# Patient Record
Sex: Male | Born: 1955 | Race: White | Hispanic: No | Marital: Married | Smoking: Never smoker
Health system: Southern US, Community
[De-identification: ages and names within clinical notes are randomized; demographics above are authoritative.]

## PROBLEM LIST (undated history)

## (undated) DIAGNOSIS — I10 Essential (primary) hypertension: Secondary | ICD-10-CM

## (undated) DIAGNOSIS — G4730 Sleep apnea, unspecified: Secondary | ICD-10-CM

## (undated) DIAGNOSIS — K589 Irritable bowel syndrome without diarrhea: Secondary | ICD-10-CM

## (undated) DIAGNOSIS — M1990 Unspecified osteoarthritis, unspecified site: Secondary | ICD-10-CM

## (undated) DIAGNOSIS — K5792 Diverticulitis of intestine, part unspecified, without perforation or abscess without bleeding: Secondary | ICD-10-CM

## (undated) DIAGNOSIS — E119 Type 2 diabetes mellitus without complications: Secondary | ICD-10-CM

## (undated) DIAGNOSIS — M109 Gout, unspecified: Secondary | ICD-10-CM

## (undated) HISTORY — DX: Essential (primary) hypertension: I10

## (undated) HISTORY — PX: WRIST SURGERY: SHX841

## (undated) HISTORY — DX: Gout, unspecified: M10.9

## (undated) HISTORY — DX: Diverticulitis of intestine, part unspecified, without perforation or abscess without bleeding: K57.92

## (undated) HISTORY — DX: Irritable bowel syndrome, unspecified: K58.9

## (undated) HISTORY — PX: OTHER SURGICAL HISTORY: SHX169

---

## 2004-06-21 ENCOUNTER — Emergency Department (HOSPITAL_COMMUNITY): Admission: EM | Admit: 2004-06-21 | Discharge: 2004-06-21 | Payer: Self-pay | Admitting: Emergency Medicine

## 2004-06-23 ENCOUNTER — Emergency Department (HOSPITAL_COMMUNITY): Admission: EM | Admit: 2004-06-23 | Discharge: 2004-06-23 | Payer: Self-pay | Admitting: Emergency Medicine

## 2004-11-10 ENCOUNTER — Emergency Department (HOSPITAL_COMMUNITY): Admission: EM | Admit: 2004-11-10 | Discharge: 2004-11-10 | Payer: Self-pay | Admitting: Family Medicine

## 2004-11-12 ENCOUNTER — Ambulatory Visit (HOSPITAL_BASED_OUTPATIENT_CLINIC_OR_DEPARTMENT_OTHER): Admission: RE | Admit: 2004-11-12 | Discharge: 2004-11-12 | Payer: Self-pay | Admitting: Orthopedic Surgery

## 2004-11-12 ENCOUNTER — Ambulatory Visit (HOSPITAL_COMMUNITY): Admission: RE | Admit: 2004-11-12 | Discharge: 2004-11-12 | Payer: Self-pay | Admitting: Orthopedic Surgery

## 2007-01-16 ENCOUNTER — Ambulatory Visit (HOSPITAL_COMMUNITY): Admission: RE | Admit: 2007-01-16 | Discharge: 2007-01-16 | Payer: Self-pay | Admitting: Family Medicine

## 2007-01-24 ENCOUNTER — Encounter: Admission: RE | Admit: 2007-01-24 | Discharge: 2007-04-24 | Payer: Self-pay | Admitting: Family Medicine

## 2009-01-24 ENCOUNTER — Emergency Department (HOSPITAL_COMMUNITY): Admission: EM | Admit: 2009-01-24 | Discharge: 2009-01-24 | Payer: Self-pay | Admitting: Family Medicine

## 2009-04-08 ENCOUNTER — Emergency Department (HOSPITAL_COMMUNITY): Admission: EM | Admit: 2009-04-08 | Discharge: 2009-04-08 | Payer: Self-pay | Admitting: Family Medicine

## 2011-03-12 NOTE — Op Note (Signed)
NAME:  Craig Wagner, AVITABILE NO.:  [id number]   MEDICAL RECORD NO.:  [id number]          PATIENT TYPE:  AMB   LOCATION:  DSC                          FACILITY:  [facility]   PHYSICIAN:  Cindee Salt, M.D.       DATE OF BIRTH:  10/05/1956   DATE OF PROCEDURE:  11/12/2004  DATE OF DISCHARGE:                                 OPERATIVE REPORT   PREOPERATIVE DIAGNOSIS:  Dog bite with infection, right index finger.   POSTOPERATIVE DIAGNOSIS:  Dog bite with infection, right index finger.   OPERATION:  Incision and drainage right index finger   SURGEON:  Cindee Salt, M.D.   ANESTHESIA:  General.   HISTORY:  The patient is a 55 year old male who suffered. Dog bite to his  __________ index finger. He has obvious infection of the index with a nail  plate injury.  He is admitted for incision and drainage.   PROCEDURE:  The patient is brought to the operating room and general  anesthetic carried out without difficulty.  He was prepped using DuraPrep,  supine position, left arm free. The limb was elevated for exsanguination,  tourniquet placed high on the arm was inflated to 250 mmHg.  The volar  incision was opened. Purulent material immediately encountered. Cultures  taken for both aerobic and anaerobic cultures. This was widely opened.  A  separate incision was then made over the dorsal aspect of the finger. This  did not show any purulent material. The nail plate was removed. The  laceration of the nail bed was identified. These were each irrigated, out-  packed, and a sterile compressive dressing was applied. The patient  tolerated the procedure well was taken to the recovery room for observation  in satisfactory condition. He is discharged home to return to __________ of  [facility] on Monday on Vicodin. He is on Augmentin.       GK/MEDQ  D:  11/12/2004  T:  11/12/2004  Job:  [id number]

## 2013-06-05 ENCOUNTER — Encounter: Payer: Self-pay | Admitting: Emergency Medicine

## 2013-06-05 ENCOUNTER — Emergency Department
Admission: EM | Admit: 2013-06-05 | Discharge: 2013-06-05 | Disposition: A | Discharge status: Home / Self Care | Payer: Self-pay | Source: Home / Self Care | Attending: Emergency Medicine | Admitting: Emergency Medicine

## 2013-06-05 DIAGNOSIS — S61509A Unspecified open wound of unspecified wrist, initial encounter: Secondary | ICD-10-CM

## 2013-06-05 DIAGNOSIS — Z23 Encounter for immunization: Secondary | ICD-10-CM

## 2013-06-05 DIAGNOSIS — S61512A Laceration without foreign body of left wrist, initial encounter: Secondary | ICD-10-CM

## 2013-06-05 MED ORDER — AMOXICILLIN-POT CLAVULANATE 875-125 MG PO TABS: 1.0000 | ORAL_TABLET | Freq: Two times a day (BID) | ORAL | Status: DC

## 2013-06-05 NOTE — ED Provider Notes (Signed)
CSN: [id number]     Arrival date & time 06/05/13  1431 History     First MD Initiated Contact with Patient 06/05/13 1453     Chief Complaint  Patient presents with  . Extremity Laceration    Patient is a 57 y.o. male presenting with skin laceration. The history is provided by the patient.  Laceration Location:  Shoulder/arm Shoulder/arm laceration location:  L wrist Length (cm):  3 Depth:  Through dermis Quality: straight   Bleeding: controlled   Time since incident:  1 hour Laceration mechanism:  Knife (Accidental, while working doing repairs at home) Pain details:    Quality:  Sharp   Severity:  Mild   Timing:  Constant   Progression:  Unchanged Foreign body present:  No foreign bodies Relieved by:  Nothing Tetanus status: About 5 years ago.  Past medical history: Borderline elevated BP, followed by his PCP. Otherwise, past medical history negative. History reviewed. No pertinent past medical history. History reviewed. No pertinent past surgical history. No family history on file.  family history noncontributory. History  Substance Use Topics  . Smoking status: Not on file  . Smokeless tobacco: Not on file  . Alcohol Use: Not on file    Review of Systems  All other systems reviewed and are negative.    Allergies  Review of patient's allergies indicates no known allergies.  Home Medications   Current Outpatient Rx  Name  Route  Sig  Dispense  Refill  . amoxicillin-clavulanate (AUGMENTIN) 875-125 MG per tablet   Oral   Take 1 tablet by mouth every 12 (twelve) hours. Take for 5 days. Take with food. (This is an antibiotic)   10 tablet   0    BP 155/95  Pulse 80  Temp(Src) 98 F (36.7 C) (Oral)  Ht 6' (1.829 m)  Wt 195 lb (88.451 kg)  BMI 26.44 kg/m2  SpO2 96% Physical Exam  Nursing note and vitals reviewed. Constitutional: He is oriented to person, place, and time. He appears well-developed and well-nourished. No distress.  HENT:  Head:  Normocephalic and atraumatic.  Eyes: Conjunctivae and EOM are normal. Pupils are equal, round, and reactive to light. No scleral icterus.  Neck: Normal range of motion.  Cardiovascular: Normal rate.   Pulmonary/Chest: Effort normal.  Abdominal: He exhibits no distension.  Musculoskeletal: Normal range of motion.       Left forearm: He exhibits laceration. He exhibits no bony tenderness.       Arms: Radial artery intact. Distal neurovascular normal. Capillary refill distally of each digit normal. Range of motion and tendons tested, all within normal limits. No active bleeding. No bony tenderness. Wrist and hand functions normally.  Neurological: He is alert and oriented to person, place, and time.  Skin: Skin is warm.  Psychiatric: He has a normal mood and affect.    ED Course   LACERATION REPAIR Date/Time: 06/05/2013 3:00 PM Performed by: Georgina Pillion DAVID Authorized by: Georgina Pillion, DAVID Consent: Verbal consent obtained. Risks and benefits: risks, benefits and alternatives were discussed Consent given by: patient Patient understanding: patient states understanding of the procedure being performed Patient identity confirmed: verbally with patient Time out: Immediately prior to procedure a "time out" was called to verify the correct patient, procedure, equipment, support staff and site/side marked as required. Location: Left palmar wrist. Laceration length: 3 cm Foreign bodies: no foreign bodies Tendon involvement: none Nerve involvement: none Vascular damage: no Anesthesia: local infiltration Local anesthetic: lidocaine 2% with epinephrine Anesthetic total: 3  ml Patient sedated: no Preparation: Patient was prepped and draped in the usual sterile fashion. Irrigation solution: saline Irrigation method: tap Amount of cleaning: extensive Debridement: none Degree of undermining: none Skin closure: 4-0 nylon Number of sutures: 3 Technique: simple Approximation: close Approximation  difficulty: simple Dressing: pressure dressing and non-adhesive packing strip Patient tolerance: Patient tolerated the procedure well with no immediate complications.    Labs Reviewed - No data to display No results found. 1. Laceration of wrist without complication, left, initial encounter     MDM  Laceration repair as above. Wound care discussed, questions invited and answered. Written instructions, see details in AVS. DTaP booster given. Augmentin 875 twice a day x5 days . Red flags discussed . Suture removal 12 days. Also, I personally rechecked his BP after procedure, BP 155/95. Advised to followup with PCP to have BP rechecked within the next 2 weeks. Patient voiced understanding and agreement with above.  Lajean Manes, MD 06/05/13 [phone]

## 2013-06-05 NOTE — ED Notes (Signed)
Left wrist laceration cut with a knife while cutting a pipe

## 2014-01-17 ENCOUNTER — Ambulatory Visit (INDEPENDENT_AMBULATORY_CARE_PROVIDER_SITE_OTHER): Payer: 59 | Admitting: Podiatry

## 2014-01-17 ENCOUNTER — Ambulatory Visit (INDEPENDENT_AMBULATORY_CARE_PROVIDER_SITE_OTHER): Payer: 59

## 2014-01-17 ENCOUNTER — Encounter: Payer: Self-pay | Admitting: Podiatry

## 2014-01-17 DIAGNOSIS — R52 Pain, unspecified: Secondary | ICD-10-CM

## 2014-01-17 DIAGNOSIS — M779 Enthesopathy, unspecified: Secondary | ICD-10-CM

## 2014-01-17 DIAGNOSIS — M109 Gout, unspecified: Secondary | ICD-10-CM

## 2014-01-17 LAB — SEDIMENTATION RATE: SED RATE: 11 mm/h (ref 0–16)

## 2014-01-17 LAB — RHEUMATOID FACTOR: Rhuematoid fact SerPl-aCnc: <10 IU/mL (ref <=14)

## 2014-01-17 LAB — URIC ACID: Uric Acid, Serum: 6.9 mg/dL (ref 4.0–7.8)

## 2014-01-17 LAB — C-REACTIVE PROTEIN: CRP: 2.8 mg/dL — AB (ref <0.60)

## 2014-01-17 MED ORDER — METHYLPREDNISOLONE 4 MG PO KIT: PACK | ORAL | Status: DC

## 2014-01-17 MED ORDER — TRIAMCINOLONE ACETONIDE 10 MG/ML IJ SUSP
10.0000 mg | Freq: Once | INTRAMUSCULAR | Status: AC
  Administered 2014-01-17: 10 mg

## 2014-01-17 NOTE — Progress Notes (Signed)
   Subjective:    Patient ID: Craig Wagner, male    DOB: 06-Oct-1956, 58 y.o.   MRN: [id number]  HPI PT STATED BOTH ARE HURTING AND HAVE BURNING SENSATION FOR 2 WEEKS. THE FEET ARE GETTING WORSE. THE FOOT GET AGGRAVATED BY PRESSURE AND WALKING. TRIED TO USED HYDROCODONE BUT IT HELP SOME.    Review of Systems  Musculoskeletal: Positive for back pain, gait problem, joint swelling and myalgias.  All other systems reviewed and are negative.       Objective:   Physical Exam        Assessment & Plan:

## 2014-01-17 NOTE — Progress Notes (Signed)
Subjective:     Patient ID: Craig Wagner, male   DOB: 1956-07-08, 58 y.o.   MRN: [id number]  Foot Pain   patient states I'm having a lot of pain in the front part of my right foot making walking difficult and I have had a history of gout of my big toe joint left that have not had for a while. Does not remember specific incident that may have created this   Review of Systems  All other systems reviewed and are negative.       Objective:   Physical Exam  Nursing note and vitals reviewed. Constitutional: He is oriented to person, place, and time.  Cardiovascular: Intact distal pulses.   Musculoskeletal: Normal range of motion.  Neurological: He is oriented to person, place, and time.  Skin: Skin is warm.   neurovascular status intact with range of motion adequate and muscle strength within normal limits. Forefoot edema with exquisite discomfort around the third metatarsophalangeal joint and second metatarsophalangeal joint with redness and inflammation noted. No other changes were noted except some deformity of the feet as far as the arch that was    Assessment:     Capsulitis of the right second and third metatarsophalangeal joint with the third being worse and possibility for gout that is causing be extreme inflammation he is experiencing along with mild to moderate tendinitis of the plantar arch    Plan:     Reviewed x-rays and condition. At this time am focusing on these joints and I did a proximal nerve block 60 mg Xylocaine Marcaine mixture aspirated the second and third MPJ I was able to get a small amount of serous fluid with no indications of arthritis and I injected with a half cc of dexamethasone Kenalog combination and I ordered blood work to rule out gout. Also placed on metal dosepak and reappoint for recheck in 2 weeks

## 2014-01-18 LAB — ANA: ANA: NEGATIVE

## 2014-02-04 ENCOUNTER — Ambulatory Visit (INDEPENDENT_AMBULATORY_CARE_PROVIDER_SITE_OTHER): Payer: 59 | Admitting: Podiatry

## 2014-02-04 ENCOUNTER — Encounter: Payer: Self-pay | Admitting: Podiatry

## 2014-02-04 VITALS — BP 142/80 | HR 86 | Resp 12

## 2014-02-04 DIAGNOSIS — M779 Enthesopathy, unspecified: Secondary | ICD-10-CM

## 2014-02-05 NOTE — Progress Notes (Signed)
Subjective:     Patient ID: Craig Wagner, male   DOB: May 26, 1956, 58 y.o.   MRN: [id number]  HPI patient states my foot is feeling quite a bit better with significant diminishment of discomfort   Review of Systems     Objective:   Physical Exam Neurovascular status intact with no history changes noted and pain has reduced by about 80% and the forefoot    Assessment:     Improve capsulitis tendinitis symptoms    Plan:     Advised on physical therapy and oral anti-inflammatories along with considerations long-term shoe gear modification and stretching. Reappoint for [place] to recheck

## 2014-02-08 ENCOUNTER — Encounter: Payer: Self-pay | Admitting: Podiatry

## 2014-02-08 ENCOUNTER — Ambulatory Visit (INDEPENDENT_AMBULATORY_CARE_PROVIDER_SITE_OTHER): Payer: 59 | Admitting: Podiatry

## 2014-02-08 VITALS — BP 147/95 | HR 60 | Resp 16

## 2014-02-08 DIAGNOSIS — M7750 Other enthesopathy of unspecified foot: Secondary | ICD-10-CM

## 2014-02-08 DIAGNOSIS — M109 Gout, unspecified: Secondary | ICD-10-CM

## 2014-02-08 MED ORDER — TRIAMCINOLONE ACETONIDE 10 MG/ML IJ SUSP
10.0000 mg | Freq: Once | INTRAMUSCULAR | Status: AC
  Administered 2014-02-08: 10 mg

## 2014-02-08 MED ORDER — METHYLPREDNISOLONE 4 MG PO KIT: PACK | ORAL | Status: DC

## 2014-02-10 NOTE — Progress Notes (Signed)
Subjective:     Patient ID: Craig Wagner, male   DOB: June 29, 1956, 58 y.o.   MRN: [id number]  HPI patient points to the right foot stating my ankle has started to really hurt me. States the big toe joint is doing fine and this pain started in the last few days   Review of Systems     Objective:   Physical Exam Neurovascular status was found to be intact with redness and irritation around the medial malleolus and the posterior tibial muscle group as it goes under the malleolus. There is some range of motion splinting secondary to the pain and no other areas of discomfort    Assessment:     Difficult to say whether this is systemic and may be related to gout or arthritis or whether it to localized inflammation    Plan:     Educated him on arthritis gout and we reviewed his previous bloodwork today I placed him back on a 60 Medrol Dosepak and I injected around the posterior tib 3 mg Kenalog 5 mg Xylocaine Marcaine mixture I gave him strict instructions that of recurrence were to occur but I want to get him to a rheumatologist and we discussed this at this time but he would rather wait and see how he responds to this current treatment

## 2015-10-07 ENCOUNTER — Ambulatory Visit: Payer: 59 | Attending: Pain Medicine | Admitting: Pain Medicine

## 2015-10-07 ENCOUNTER — Encounter: Payer: Self-pay | Admitting: Pain Medicine

## 2015-10-07 VITALS — BP 165/89 | HR 54 | Temp 98.5°F | Resp 16 | Ht 72.0 in | Wt 195.0 lb

## 2015-10-07 DIAGNOSIS — M19041 Primary osteoarthritis, right hand: Secondary | ICD-10-CM | POA: Insufficient documentation

## 2015-10-07 DIAGNOSIS — M25579 Pain in unspecified ankle and joints of unspecified foot: Secondary | ICD-10-CM | POA: Diagnosis present

## 2015-10-07 DIAGNOSIS — E7800 Pure hypercholesterolemia, unspecified: Secondary | ICD-10-CM | POA: Diagnosis not present

## 2015-10-07 DIAGNOSIS — M19072 Primary osteoarthritis, left ankle and foot: Secondary | ICD-10-CM | POA: Diagnosis not present

## 2015-10-07 DIAGNOSIS — M109 Gout, unspecified: Secondary | ICD-10-CM | POA: Diagnosis not present

## 2015-10-07 DIAGNOSIS — M19042 Primary osteoarthritis, left hand: Secondary | ICD-10-CM | POA: Insufficient documentation

## 2015-10-07 DIAGNOSIS — M1000 Idiopathic gout, unspecified site: Secondary | ICD-10-CM

## 2015-10-07 DIAGNOSIS — M459 Ankylosing spondylitis of unspecified sites in spine: Secondary | ICD-10-CM | POA: Diagnosis not present

## 2015-10-07 DIAGNOSIS — F329 Major depressive disorder, single episode, unspecified: Secondary | ICD-10-CM | POA: Insufficient documentation

## 2015-10-07 DIAGNOSIS — M1712 Unilateral primary osteoarthritis, left knee: Secondary | ICD-10-CM | POA: Insufficient documentation

## 2015-10-07 DIAGNOSIS — M19079 Primary osteoarthritis, unspecified ankle and foot: Secondary | ICD-10-CM | POA: Insufficient documentation

## 2015-10-07 DIAGNOSIS — K5790 Diverticulosis of intestine, part unspecified, without perforation or abscess without bleeding: Secondary | ICD-10-CM | POA: Diagnosis not present

## 2015-10-07 DIAGNOSIS — M19071 Primary osteoarthritis, right ankle and foot: Secondary | ICD-10-CM | POA: Insufficient documentation

## 2015-10-07 DIAGNOSIS — M170 Bilateral primary osteoarthritis of knee: Secondary | ICD-10-CM | POA: Insufficient documentation

## 2015-10-07 DIAGNOSIS — K589 Irritable bowel syndrome without diarrhea: Secondary | ICD-10-CM | POA: Diagnosis not present

## 2015-10-07 DIAGNOSIS — I10 Essential (primary) hypertension: Secondary | ICD-10-CM | POA: Diagnosis not present

## 2015-10-07 NOTE — Patient Instructions (Addendum)
Continue present medication  Do not take meloxicam if take diclofenac  F/U PCP Dr Alroy Dust for evaliation of  BP and general medical  condition  F/U surgical evaluation Follow-up orthopedic evaluation as discussed  Recommend reevaluation with Dr. Dimas Aguas to discuss orthotics and further treatment  May consider vascular evaluation of lower extremities  F/U neurological evaluation. May consider pending follow-up evaluations  F/U  rheumatological evaluation as discussed  May consider radiofrequency rhizolysis or intraspinal procedures pending response to present treatment and F/U evaluation   Patient to call Pain Management Center should patient have concerns prior to scheduled return appointment.

## 2015-10-07 NOTE — Progress Notes (Signed)
Subjective:    Patient ID: Craig Wagner, male    DOB: 1956/08/25, 59 y.o.   MRN: [id number]  HPI   The patient is a 59 year old gentleman who comes to pain management Center at the request of Dr. Donnie Coffin for further evaluation and treatment of pain involving multiple regions including the ankles upper extremity regions and the lower back region as well as the thoracic region. The patient states that he has had pain for years. The patient stated that he had to crawl to the bathroom in the mornings when he was a teenager due to severe pain involving the ankles knees and feet. The patient stated that he played football and other sports in high school. The patient states that he has been diagnosed as having ankylosing spondylitis and is undergone rheumatological evaluation as well as orthopedic evaluation. We discussed patient's condition and informed patient that we wish to have patient undergo continued rheumatological evaluations we also informed patient that we would like to have patient undergo further evaluation with Dr. Dimas Aguas podiatrist in that we would like to have patient undergo evaluation at [place] [facility] is to to for recommendations regarding patient's treatment as well. The patient was understanding and in agreement with suggested treatment plan. On today's visit patient described his pain as aching intervention cramping nagging sharp shooting stabbing sensations. The patient states the pain awakened him from sleep and independent with abilities to go to sleep. The patient stated the pain associated with spasms in ability to concentrate depression bending and lifting motion sitting standing working. The patient stated the pain had improvement with warm showers and baths. We will avoid interventional treatment and will proceed with scheduling patient for further evaluation and pending response to recommendations from [facility] and pending follow-up evaluations with patient  we will consider modifications of treatment regimen as discussed and explained to patient on today's visit. The patient was with understanding and in agreement suggested treatment plan The patient has been evaluated for ankylosing spondylitis gout osteoarthritis and is with history of hypercholesterolemia diverticulosis. We will await evaluation at [facility] and will consider patient for further evaluation and treatment as discussed and as explained on today's visit       Review of Systems   Cardiovascular: High blood pressure   Pulmonary: Unremarkable  Neurological: Unremarkable  Psychological: Depression   Gastrointestinal: Irritable bowel syndrome   Genitourinary: History of hematuria   Hematologic: Unremarkable  Endocrine: Unremarkable  Rheumatological: Unremarkable  Musculoskeletal: Unremarkable  Other significant: Unremarkable     Objective:   Physical Exam   There was tenderness to palpation of paraspinal misreading serving cervical facet region a mild degree with mild tenderness over the splenius capitis and occipitalis musculature region. There was tenderness to palpation over the acromioclavicular and glenohumeral joint region a mild degree. There was mild just palpation over the cervical facet cervical paraspinal musculature region and thoracic facet thoracic paraspinal musculature region. Patient appeared to be with slightly decreased grip strength. There was mild tinnitus to palpation of the hands with Tinel and Phalen's maneuver reproducing minimal discomfort. No increased warmth or erythema of the hands were noted. Was tends to palpation over the thoracic facet thoracic paraspinal musculature region with no crepitus of the thoracic region noted. Palpation over the lumbar paraspinal must reason lumbar facet region was attends to palpation of mild to moderate degree with lateral bending rotation extension and palpation of the lumbar facets  reproducing mild to moderate discomfort. There was mild  to moderate discomfort of the PSIS PII S region gluteal and piriformis musculature regions. Palpation greater trochanteric region iliotibial band region was attends to palpation of mild to moderate degree. Straight leg raise was tolerates appointment 30 without increased pain with dorsiflexion noted. No sensory deficit or dermatomal distribution detected. The feet were with tennis to palpation with Increased warmth and erythema and with out allodynia of the feet there was tends to palpation of the medial malleolus with no evidence of obvious lesions of the lower extremities noted. EHL strength was decreased no sensory deficits of dermatomal distribution was detected. There was negative clonus negative Homans abdomen nontender with no costovertebral tenderness noted.       Assessment & Plan:    Degenerative joint disease  Ankles  Feet  Knees  Hands   Ankylosing spondylitis   Osteoarthritis   Gout   Irritable bowel syndrome   Depression   Diverticulosis       PLAN    Continue present medication  Do not take meloxicam if take diclofenac  Neurontin was discussed on today's visit and we will request that Dr. Alroy Dust begin Neurontin at a dose which we have suggested. We will assume prescribing Neurontin on follow-up evaluation and will consider additional modifications of patient's treatment regimen pending follow-up evaluation in pending results of additional evaluations and consultations  F/U PCP Dr Alroy Dust for evaliation of  BP and general medical  condition  F/U surgical evaluation Follow-up orthopedic evaluation as discussed  Recommend reevaluation with Dr. Dimas Aguas to discuss orthotics and further treatment  May consider vascular evaluation of lower extremities  Evaluation at [facility] as planned  F/U neurological evaluation. May consider pending follow-up evaluations  F/U  rheumatological  evaluation as discussed  May consider radiofrequency rhizolysis or intraspinal procedures pending response to present treatment and F/U evaluation   Patient to call Pain Management Center should patient have concerns prior to scheduled return appointment.

## 2015-10-07 NOTE — Progress Notes (Signed)
Safety precautions to be maintained throughout the outpatient stay will include: orient to surroundings, keep bed in low position, maintain call bell within reach at all times, provide assistance with transfer out of bed and ambulation.  

## 2015-10-31 ENCOUNTER — Ambulatory Visit
Admission: RE | Admit: 2015-10-31 | Discharge: 2015-10-31 | Disposition: A | Discharge status: Home / Self Care | Payer: 59 | Source: Ambulatory Visit | Attending: Pain Medicine | Admitting: Pain Medicine

## 2015-10-31 ENCOUNTER — Other Ambulatory Visit: Payer: Self-pay | Admitting: Pain Medicine

## 2015-10-31 DIAGNOSIS — M1000 Idiopathic gout, unspecified site: Secondary | ICD-10-CM | POA: Insufficient documentation

## 2015-10-31 DIAGNOSIS — M459 Ankylosing spondylitis of unspecified sites in spine: Secondary | ICD-10-CM | POA: Insufficient documentation

## 2015-10-31 DIAGNOSIS — M19071 Primary osteoarthritis, right ankle and foot: Secondary | ICD-10-CM | POA: Diagnosis not present

## 2015-10-31 DIAGNOSIS — M5126 Other intervertebral disc displacement, lumbar region: Secondary | ICD-10-CM | POA: Diagnosis not present

## 2015-10-31 DIAGNOSIS — M4806 Spinal stenosis, lumbar region: Secondary | ICD-10-CM | POA: Insufficient documentation

## 2015-10-31 DIAGNOSIS — M179 Osteoarthritis of knee, unspecified: Secondary | ICD-10-CM | POA: Insufficient documentation

## 2015-10-31 DIAGNOSIS — M5127 Other intervertebral disc displacement, lumbosacral region: Secondary | ICD-10-CM | POA: Diagnosis not present

## 2015-10-31 DIAGNOSIS — M1712 Unilateral primary osteoarthritis, left knee: Secondary | ICD-10-CM

## 2015-11-03 ENCOUNTER — Telehealth: Payer: Self-pay | Admitting: *Deleted

## 2015-11-03 DIAGNOSIS — M1711 Unilateral primary osteoarthritis, right knee: Secondary | ICD-10-CM | POA: Diagnosis not present

## 2015-11-03 NOTE — Telephone Encounter (Signed)
Left message to call our office

## 2015-11-03 NOTE — Progress Notes (Signed)
Left message to call our office

## 2015-11-05 DIAGNOSIS — H524 Presbyopia: Secondary | ICD-10-CM | POA: Diagnosis not present

## 2015-12-16 ENCOUNTER — Ambulatory Visit: Payer: 59 | Admitting: Pain Medicine

## 2015-12-25 DIAGNOSIS — M129 Arthropathy, unspecified: Secondary | ICD-10-CM | POA: Diagnosis not present

## 2015-12-25 DIAGNOSIS — M179 Osteoarthritis of knee, unspecified: Secondary | ICD-10-CM | POA: Diagnosis not present

## 2015-12-25 DIAGNOSIS — M5416 Radiculopathy, lumbar region: Secondary | ICD-10-CM | POA: Diagnosis not present

## 2015-12-25 DIAGNOSIS — M109 Gout, unspecified: Secondary | ICD-10-CM | POA: Diagnosis not present

## 2015-12-27 ENCOUNTER — Other Ambulatory Visit: Payer: Self-pay | Admitting: Pain Medicine

## 2015-12-30 MED FILL — MELOXICAM 15 MG TABLET: 15 | 30 days supply | Qty: 30 | Fill #0

## 2016-01-01 ENCOUNTER — Other Ambulatory Visit: Payer: Self-pay | Admitting: Pain Medicine

## 2016-01-05 ENCOUNTER — Other Ambulatory Visit: Payer: Self-pay | Admitting: Pain Medicine

## 2016-01-15 MED FILL — FINASTERIDE 1 MG TABLET: 1 | 90 days supply | Qty: 90 | Fill #0

## 2016-01-15 MED FILL — LISINOPRIL-HCTZ 20-12.5 MG: 20-12.5 | 90 days supply | Qty: 90 | Fill #0

## 2016-02-25 DIAGNOSIS — E782 Mixed hyperlipidemia: Secondary | ICD-10-CM | POA: Diagnosis not present

## 2016-02-25 DIAGNOSIS — I10 Essential (primary) hypertension: Secondary | ICD-10-CM | POA: Diagnosis not present

## 2016-02-25 DIAGNOSIS — M2550 Pain in unspecified joint: Secondary | ICD-10-CM | POA: Diagnosis not present

## 2016-02-25 DIAGNOSIS — L659 Nonscarring hair loss, unspecified: Secondary | ICD-10-CM | POA: Diagnosis not present

## 2016-02-25 DIAGNOSIS — Z125 Encounter for screening for malignant neoplasm of prostate: Secondary | ICD-10-CM | POA: Diagnosis not present

## 2016-02-25 DIAGNOSIS — Z0000 Encounter for general adult medical examination without abnormal findings: Secondary | ICD-10-CM | POA: Diagnosis not present

## 2016-03-23 DIAGNOSIS — M25561 Pain in right knee: Secondary | ICD-10-CM | POA: Diagnosis not present

## 2016-03-23 DIAGNOSIS — M1711 Unilateral primary osteoarthritis, right knee: Secondary | ICD-10-CM | POA: Diagnosis not present

## 2016-03-23 DIAGNOSIS — M25572 Pain in left ankle and joints of left foot: Secondary | ICD-10-CM | POA: Diagnosis not present

## 2016-03-23 DIAGNOSIS — M76822 Posterior tibial tendinitis, left leg: Secondary | ICD-10-CM | POA: Diagnosis not present

## 2016-03-23 DIAGNOSIS — M25571 Pain in right ankle and joints of right foot: Secondary | ICD-10-CM | POA: Diagnosis not present

## 2016-03-24 MED FILL — traMADol HCL 50 MG TABS: 50 | 7 days supply | Qty: 30 | Fill #0

## 2016-04-01 MED FILL — MELOXICAM 15 MG TABLET: 15 | 30 days supply | Qty: 30 | Fill #0

## 2016-06-15 MED FILL — FINASTERIDE 1 MG TABLET: 1 | 90 days supply | Qty: 90 | Fill #0

## 2016-06-15 MED FILL — MELOXICAM 15 MG TABLET: 15 | 30 days supply | Qty: 30 | Fill #1

## 2016-06-22 MED FILL — LISINOPRIL-HCTZ 20-12.5 MG: 20-12.5 | 90 days supply | Qty: 90 | Fill #0

## 2016-06-22 MED FILL — predniSONE 20 MG TABS: 20 | 6 days supply | Qty: 12 | Fill #0

## 2016-06-22 MED FILL — traMADol HCL 50 MG TABS: 50 | 7 days supply | Qty: 30 | Fill #0

## 2016-09-02 DIAGNOSIS — M109 Gout, unspecified: Secondary | ICD-10-CM | POA: Diagnosis not present

## 2016-09-02 DIAGNOSIS — E782 Mixed hyperlipidemia: Secondary | ICD-10-CM | POA: Diagnosis not present

## 2016-09-02 DIAGNOSIS — I10 Essential (primary) hypertension: Secondary | ICD-10-CM | POA: Diagnosis not present

## 2016-09-02 DIAGNOSIS — M79675 Pain in left toe(s): Secondary | ICD-10-CM | POA: Diagnosis not present

## 2016-09-02 DIAGNOSIS — L659 Nonscarring hair loss, unspecified: Secondary | ICD-10-CM | POA: Diagnosis not present

## 2016-11-24 MED FILL — FINASTERIDE 1 MG TABLET: 1 | 90 days supply | Qty: 90 | Fill #0

## 2016-11-24 MED FILL — MELOXICAM 15 MG TABLET: 15 | 90 days supply | Qty: 90 | Fill #0

## 2016-11-24 MED FILL — predniSONE 20 MG TABS: 20 | 6 days supply | Qty: 12 | Fill #0

## 2017-02-21 ENCOUNTER — Ambulatory Visit: Payer: 59 | Admitting: Orthopedic Surgery

## 2017-03-10 DIAGNOSIS — M109 Gout, unspecified: Secondary | ICD-10-CM | POA: Diagnosis not present

## 2017-03-10 DIAGNOSIS — E782 Mixed hyperlipidemia: Secondary | ICD-10-CM | POA: Diagnosis not present

## 2017-03-10 DIAGNOSIS — I10 Essential (primary) hypertension: Secondary | ICD-10-CM | POA: Diagnosis not present

## 2017-03-10 DIAGNOSIS — L659 Nonscarring hair loss, unspecified: Secondary | ICD-10-CM | POA: Diagnosis not present

## 2017-03-10 DIAGNOSIS — Z125 Encounter for screening for malignant neoplasm of prostate: Secondary | ICD-10-CM | POA: Diagnosis not present

## 2017-03-10 DIAGNOSIS — M2550 Pain in unspecified joint: Secondary | ICD-10-CM | POA: Diagnosis not present

## 2017-05-04 MED FILL — COLCRYS 0.6 MG TABLET: 0.6 | 15 days supply | Qty: 7 | Fill #0

## 2017-05-04 MED FILL — FINASTERIDE 1 MG TABLET: 1 | 90 days supply | Qty: 90 | Fill #0

## 2017-05-04 MED FILL — MELOXICAM 15 MG TABLET: 15 | 90 days supply | Qty: 90 | Fill #0

## 2017-05-04 MED FILL — traMADol HCL 50 MG TABS: 50 | 7 days supply | Qty: 30 | Fill #0

## 2017-05-04 MED FILL — predniSONE 20 MG TABS: 20 | 6 days supply | Qty: 12 | Fill #0

## 2017-05-04 MED FILL — LISINOPRIL-HCTZ 20-12.5 MG: 20-12.5 | 90 days supply | Qty: 90 | Fill #0

## 2017-05-09 DIAGNOSIS — M25572 Pain in left ankle and joints of left foot: Secondary | ICD-10-CM | POA: Diagnosis not present

## 2017-05-30 DIAGNOSIS — Z6826 Body mass index (BMI) 26.0-26.9, adult: Secondary | ICD-10-CM | POA: Diagnosis not present

## 2017-05-30 DIAGNOSIS — M2550 Pain in unspecified joint: Secondary | ICD-10-CM | POA: Diagnosis not present

## 2017-05-30 DIAGNOSIS — M150 Primary generalized (osteo)arthritis: Secondary | ICD-10-CM | POA: Diagnosis not present

## 2017-05-30 DIAGNOSIS — E663 Overweight: Secondary | ICD-10-CM | POA: Diagnosis not present

## 2017-05-30 DIAGNOSIS — M459 Ankylosing spondylitis of unspecified sites in spine: Secondary | ICD-10-CM | POA: Diagnosis not present

## 2017-05-30 DIAGNOSIS — M1009 Idiopathic gout, multiple sites: Secondary | ICD-10-CM | POA: Diagnosis not present

## 2017-06-08 DIAGNOSIS — H5213 Myopia, bilateral: Secondary | ICD-10-CM | POA: Diagnosis not present

## 2017-06-08 DIAGNOSIS — H524 Presbyopia: Secondary | ICD-10-CM | POA: Diagnosis not present

## 2017-06-08 DIAGNOSIS — H52203 Unspecified astigmatism, bilateral: Secondary | ICD-10-CM | POA: Diagnosis not present

## 2017-06-24 DIAGNOSIS — M1009 Idiopathic gout, multiple sites: Secondary | ICD-10-CM | POA: Diagnosis not present

## 2017-06-24 DIAGNOSIS — M459 Ankylosing spondylitis of unspecified sites in spine: Secondary | ICD-10-CM | POA: Diagnosis not present

## 2017-06-24 DIAGNOSIS — Z6827 Body mass index (BMI) 27.0-27.9, adult: Secondary | ICD-10-CM | POA: Diagnosis not present

## 2017-06-24 DIAGNOSIS — M150 Primary generalized (osteo)arthritis: Secondary | ICD-10-CM | POA: Diagnosis not present

## 2017-06-24 DIAGNOSIS — E663 Overweight: Secondary | ICD-10-CM | POA: Diagnosis not present

## 2017-06-24 DIAGNOSIS — M2550 Pain in unspecified joint: Secondary | ICD-10-CM | POA: Diagnosis not present

## 2017-06-24 MED FILL — ALLOPURINOL 100 MG TABLET: 100 | 30 days supply | Qty: 30 | Fill #0

## 2017-07-06 ENCOUNTER — Telehealth: Payer: Self-pay | Admitting: Pharmacist

## 2017-07-06 NOTE — Telephone Encounter (Signed)
Called patient to schedule an appointment for the  [employer] [facility]. I was unable to reach the patient so I left a HIPAA-compliant message requesting that the patient return my call.   

## 2017-07-18 MED FILL — CLINDAMYCIN HCL 300 MG CAP: 300 | 7 days supply | Qty: 28 | Fill #0

## 2017-07-18 MED FILL — HYDROCODON-APAP 7.5-325: 7.5-325 | 2 days supply | Qty: 12 | Fill #0

## 2017-07-18 MED FILL — IBUPROFEN 800 MG TAB: 800 | 8 days supply | Qty: 24 | Fill #0

## 2017-08-15 MED FILL — ALLOPURINOL 100 MG TABLET: 100 | 30 days supply | Qty: 30 | Fill #1

## 2017-09-05 MED FILL — FINASTERIDE 1 MG TABLET: 1 | 90 days supply | Qty: 90 | Fill #1

## 2017-09-05 MED FILL — MELOXICAM 15 MG TABLET: 15 | 90 days supply | Qty: 90 | Fill #1

## 2017-09-12 DIAGNOSIS — M25562 Pain in left knee: Secondary | ICD-10-CM | POA: Diagnosis not present

## 2017-09-12 MED FILL — LISINOPRIL-HCTZ 20-12.5 MG: 20-12.5 | 90 days supply | Qty: 90 | Fill #1

## 2017-09-27 MED FILL — ALLOPURINOL 100 MG TABLET: 100 | 30 days supply | Qty: 30 | Fill #2

## 2017-09-29 DIAGNOSIS — I10 Essential (primary) hypertension: Secondary | ICD-10-CM | POA: Diagnosis not present

## 2017-09-29 DIAGNOSIS — M109 Gout, unspecified: Secondary | ICD-10-CM | POA: Diagnosis not present

## 2017-09-29 DIAGNOSIS — R7309 Other abnormal glucose: Secondary | ICD-10-CM | POA: Diagnosis not present

## 2017-09-29 DIAGNOSIS — L659 Nonscarring hair loss, unspecified: Secondary | ICD-10-CM | POA: Diagnosis not present

## 2017-09-29 DIAGNOSIS — E782 Mixed hyperlipidemia: Secondary | ICD-10-CM | POA: Diagnosis not present

## 2017-09-29 DIAGNOSIS — R05 Cough: Secondary | ICD-10-CM | POA: Diagnosis not present

## 2017-09-29 MED FILL — AMLODIPINE BESYLATE 5 MG TA: 5 | 30 days supply | Qty: 30 | Fill #0

## 2017-10-12 MED FILL — metFORMIN HCL 500 MG TABS: 500 | 30 days supply | Qty: 60 | Fill #0

## 2017-11-08 MED FILL — metFORMIN HCL 500 MG TABS: 500 | 30 days supply | Qty: 60 | Fill #1

## 2017-11-08 MED FILL — AMLODIPINE BESYLATE 5 MG TA: 5 | 30 days supply | Qty: 30 | Fill #1

## 2017-11-08 MED FILL — CLINDAMYCIN HCL 300 MG CAPS: 300 | 7 days supply | Qty: 28 | Fill #1

## 2017-11-08 MED FILL — ALLOPURINOL 100 MG TABLET: 100 | 30 days supply | Qty: 30 | Fill #3

## 2017-11-16 ENCOUNTER — Encounter: Payer: Self-pay | Admitting: Nutrition

## 2017-11-16 ENCOUNTER — Encounter: Payer: 59 | Attending: Family Medicine | Admitting: Nutrition

## 2017-11-16 VITALS — Ht 72.0 in | Wt 203.0 lb

## 2017-11-16 DIAGNOSIS — N183 Chronic kidney disease, stage 3 unspecified: Secondary | ICD-10-CM

## 2017-11-16 DIAGNOSIS — E1122 Type 2 diabetes mellitus with diabetic chronic kidney disease: Secondary | ICD-10-CM

## 2017-11-16 NOTE — Progress Notes (Signed)
Medical Nutrition Therapy:  Appt start time: 0930 end time:  1030.  Assessment:   First Visit;  Primary concerns today: Diabetes Type 2. Recently  Dx in Dec 2018 with Type 2 DM. A1C 9.2%.  Metformin 500 mg BID. A1C 9.2%. PCP: Brien Few at [facility].  Eats 2 meals a day-skipping breakfast and only eating lunch and dinner. Mainly eats protein and not a lot of vegetables.. He does the cooking and shopping at home. Most foods are baked and grilled. Wt usually 197 lbs. He has been following a High Protein "Keto" diet to help lose and maintain weight. He admits to being more thirsty, frequent urination and fatigue lately. Thirst and urination he thought was related to his BP meds/fluid pills. He goes back to PCP for more f/u labs this next week.  Currently only taking Metform 500 mg ER daily due to kidney function.   He has a history of taking a lot of over the counter supplements for various things. He is not taking them currently. Advised to not take anything but a One a Day Men's MVI for supplements.       Labs results show CKD Stg 3a with Creatine 1.31 mg/dl and e GFR of 56. Na level was low at 135 mg/dl also. This may be a result of being dehydrated with following the KETO diet. I have requested he not follow a high protein diet due to current kidney function, DM and Gout issues.     He also has a history of Gout. A KETO diet can also trigger GOUT flare ups as well.  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 He is currently not working out on a regular basis.     He is randomly testing blood sugars but isn't sure what the numbers should be. He has not signed up for [employer] yet but will do so soon. This will give access to meter and testing supplies and monthly health coach to help with his health issues.       Current diet is insuffient to meet his needs. Current KETO may be contributing to higher blood sugars, dehydration and kidney issues. Recommend to follow the Plate  Method meal plan as instructed. Needs to drink about a gallon of water per day.      Recent labs from [place] [facility] [place] on 09/29/17 Glu  228 mg/dl BUN 24 mg/dl Creatine 1.31 mg/dl eGFR 56 Na 135 mg/dl P+ 4.1 mmol/l Ca 9.8 mg/dl A1C 9.2%  eAG 217 mg/dl  Preferred Learning Style:  No preference indicated   Learning Readiness:  Ready  Change in progress   MEDICATIONS: See list   DIETARY INTAKE:  24-hr recall:  B ( AM): Coffee, honey,  Snk ( AM):   L ( PM): Granola, water or eggs Snk ( PM): 4-6 oz Chicken, with Kens ranch dressing or buffalo sauce, water D ( PM):  Meat and sometimes vegetables, Water Snk ( PM):  Beverages: 3-4 bottles of 20 oz of water.  Usual physical activity: ADL  Estimated energy needs: 2000  calories 225 g carbohydrates 150 g protein 56 g fat  Progress Towards Goal(s):  In progress.   Nutritional Diagnosis:  NB-1.1 Food and nutrition-related knowledge deficit As related to Diabetes Type 2.  As evidenced by A1C 9.2%..    Intervention:  Nutrition and Diabetes education provided on My Plate, CHO counting, meal planning, portion sizes, timing of meals, avoiding snacks between meals unless having a low  blood sugar, target ranges for A1C and blood sugars, signs/symptoms and treatment of hyper/hypoglycemia, monitoring blood sugars, taking medications as prescribed, benefits of exercising 30 minutes per day and prevention of complications of DM.  Goals 1. Follow My Plate 2.  Eat three balanced meals- Eat 45-60 grams of carbs per meal. 3. Increase water intake to gallon 4. Increase fresh fruits and vegetables. 5. Cut out over the counter supplements 6. Test blood sugars twice a day; before breakfast and bedtime. Goals 80-130 mg/dl  before meals and less than 150 mg/dl before bedtime. Reduce protein intake to 3-5 oz per meal. Get A1C down to 7% or less. Sign up for [employer] as part of [employer] for diabetes supplies and Engineer, maintenance. This is  required of [employer].   Teaching Method Utilized:  Visual Auditory Hands on  Handouts given during visit include:  The Plate Method  Diabetes Live Well   Diabetes Instructions.   Barriers to learning/adherence to lifestyle change: none  Demonstrated degree of understanding via:  Teach Back   Monitoring/Evaluation:  Dietary intake, exercise, meal planning, SBG, and body weight in 1 month(s). Recommend to follow up on A1C and kidney function as he better hydrates and stays away from [place].

## 2017-11-16 NOTE — Patient Instructions (Addendum)
Goals 1. Follow My Plate 2.  Eat three balanced meals- Eat 45-60 grams of carbs per meal. 3. Increase water intake to gallon a day. 4. Increase fresh fruits and vegetables. 5. Cut out over the counter supplements 6. Test blood sugars twice a day; before breakfast and bedtime. Goals 80-130 before meals and less than 150 before bedtime. Reduce protein intake to 3-5 oz per meal. Get A1C down to 7% or less. Sign up for [employer] as part of [employer] for diabetes supplies and Engineer, maintenance. This is required of [employer].

## 2017-11-21 DIAGNOSIS — Z7984 Long term (current) use of oral hypoglycemic drugs: Secondary | ICD-10-CM | POA: Diagnosis not present

## 2017-11-21 DIAGNOSIS — E119 Type 2 diabetes mellitus without complications: Secondary | ICD-10-CM | POA: Diagnosis not present

## 2017-11-28 ENCOUNTER — Ambulatory Visit: Payer: 59 | Admitting: Nutrition

## 2017-12-09 MED FILL — IBUPROFEN 800 MG TAB: 800 | 8 days supply | Qty: 24 | Fill #1

## 2017-12-15 ENCOUNTER — Ambulatory Visit: Payer: 59 | Admitting: Nutrition

## 2017-12-19 MED FILL — metFORMIN HCL 500 MG TABS: 500 | 30 days supply | Qty: 60 | Fill #2

## 2017-12-19 MED FILL — FINASTERIDE 1 MG TABLET: 1 | 90 days supply | Qty: 90 | Fill #2

## 2017-12-19 MED FILL — AMLODIPINE BESYLATE 5 MG TA: 5 | 30 days supply | Qty: 30 | Fill #2

## 2018-01-27 DIAGNOSIS — M109 Gout, unspecified: Secondary | ICD-10-CM | POA: Diagnosis not present

## 2018-01-27 DIAGNOSIS — E119 Type 2 diabetes mellitus without complications: Secondary | ICD-10-CM | POA: Diagnosis not present

## 2018-01-27 DIAGNOSIS — Z7984 Long term (current) use of oral hypoglycemic drugs: Secondary | ICD-10-CM | POA: Diagnosis not present

## 2018-01-27 MED FILL — ALLOPURINOL 100 MG TABLET: 100 | 90 days supply | Qty: 90 | Fill #0

## 2018-01-31 MED FILL — AMLODIPINE BESYLATE 5 MG TA: 5 | 30 days supply | Qty: 30 | Fill #3

## 2018-02-15 MED FILL — metFORMIN HCL 500 MG TABS: 500 | 30 days supply | Qty: 60 | Fill #3

## 2018-02-15 MED FILL — MELOXICAM 15 MG TABLET: 15 | 90 days supply | Qty: 90 | Fill #2

## 2018-02-15 MED FILL — COLCRYS 0.6 MG TABLET: 0.6 | 15 days supply | Qty: 7 | Fill #1

## 2018-03-27 MED FILL — AMLODIPINE BESYLATE 5 MG TA: 5 | 30 days supply | Qty: 30 | Fill #4

## 2018-03-28 ENCOUNTER — Ambulatory Visit: Payer: 59 | Admitting: Family Medicine

## 2018-03-28 ENCOUNTER — Encounter: Payer: Self-pay | Admitting: Family Medicine

## 2018-03-28 ENCOUNTER — Other Ambulatory Visit: Payer: Self-pay

## 2018-03-28 VITALS — BP 162/94 | HR 71 | Temp 98.7°F | Resp 14 | Ht 72.0 in | Wt 192.0 lb

## 2018-03-28 DIAGNOSIS — I10 Essential (primary) hypertension: Secondary | ICD-10-CM

## 2018-03-28 DIAGNOSIS — L989 Disorder of the skin and subcutaneous tissue, unspecified: Secondary | ICD-10-CM

## 2018-03-28 DIAGNOSIS — Z1159 Encounter for screening for other viral diseases: Secondary | ICD-10-CM | POA: Diagnosis not present

## 2018-03-28 DIAGNOSIS — Z0000 Encounter for general adult medical examination without abnormal findings: Secondary | ICD-10-CM

## 2018-03-28 DIAGNOSIS — Z23 Encounter for immunization: Secondary | ICD-10-CM | POA: Diagnosis not present

## 2018-03-28 DIAGNOSIS — Z114 Encounter for screening for human immunodeficiency virus [HIV]: Secondary | ICD-10-CM | POA: Diagnosis not present

## 2018-03-28 DIAGNOSIS — Z1211 Encounter for screening for malignant neoplasm of colon: Secondary | ICD-10-CM

## 2018-03-28 DIAGNOSIS — E1165 Type 2 diabetes mellitus with hyperglycemia: Secondary | ICD-10-CM | POA: Diagnosis not present

## 2018-03-28 DIAGNOSIS — R5383 Other fatigue: Secondary | ICD-10-CM | POA: Diagnosis not present

## 2018-03-28 DIAGNOSIS — F339 Major depressive disorder, recurrent, unspecified: Secondary | ICD-10-CM | POA: Diagnosis not present

## 2018-03-28 MED ORDER — SERTRALINE HCL 50 MG PO TABS: 50.0000 mg | ORAL_TABLET | Freq: Every day | ORAL | 3 refills | Status: DC

## 2018-03-28 MED ORDER — ATORVASTATIN CALCIUM 20 MG PO TABS: 20.0000 mg | ORAL_TABLET | Freq: Every day | ORAL | 3 refills | Status: DC

## 2018-03-28 MED FILL — SERTRALINE HCL 50 MG TABLET: 50 | 30 days supply | Qty: 30 | Fill #0

## 2018-03-28 MED FILL — ATORVASTATIN 20 MG TABLET: 20 | 90 days supply | Qty: 90 | Fill #0

## 2018-03-28 NOTE — Patient Instructions (Addendum)
Take two of the norvasc/amlodipine 5 mg tablets each morning.  I will go ahead and send in the amlodipine 10 mg tablets.   You should start lipitor/atorvastatin 20 mg at night  I will call you with your lab results regarding your blood sugar and let you know what we need to do.   Start the zoloft/sertraline for mood.   BP goal: 130/80 LDL goal is less than 70 A1c goal is less than 7  Omron electric Blood pressure cuff/ arm cuff   How to Take Your Blood Pressure Blood pressure is a measurement of how strongly your blood is pressing against the walls of your arteries. Arteries are blood vessels that carry blood from your heart throughout your body. Your health care provider takes your blood pressure at each office visit. You can also take your own blood pressure at home with a blood pressure machine. You may need to take your own blood pressure:  To confirm a diagnosis of high blood pressure (hypertension).  To monitor your blood pressure over time.  To make sure your blood pressure medicine is working.  Supplies needed: To take your blood pressure, you will need a blood pressure machine. You can buy a blood pressure machine, or blood pressure monitor, at most drugstores or online. There are several types of home blood pressure monitors. When choosing one, consider the following:  Choose a monitor that has an arm cuff.  Choose a monitor that wraps snugly around your upper arm. You should be able to fit only one finger between your arm and the cuff.  Do not choose a monitor that measures your blood pressure from your wrist or finger.  Your health care provider can suggest a reliable monitor that will meet your needs. How to prepare To get the most accurate reading, avoid the following for 30 minutes before you check your blood pressure:  Drinking caffeine.  Drinking alcohol.  Eating.  Smoking.  Exercising.  Five minutes before you check your blood pressure:  Empty your  bladder.  Sit quietly without talking in a dining chair, rather than in a soft couch or armchair.  How to take your blood pressure To check your blood pressure, follow the instructions in the manual that came with your blood pressure monitor. If you have a digital blood pressure monitor, the instructions may be as follows: 1. Sit up straight. 2. Place your feet on the floor. Do not cross your ankles or legs. 3. Rest your left arm at the level of your heart on a table or desk or on the arm of a chair. 4. Pull up your shirt sleeve. 5. Wrap the blood pressure cuff around the upper part of your left arm, 1 inch (2.5 cm) above your elbow. It is best to wrap the cuff around bare skin. 6. Fit the cuff snugly around your arm. You should be able to place only one finger between the cuff and your arm. 7. Position the cord inside the groove of your elbow. 8. Press the power button. 9. Sit quietly while the cuff inflates and deflates. 10. Read the digital reading on the monitor screen and write it down (record it). 11. Wait 2-3 minutes, then repeat the steps, starting at step 1.  What does my blood pressure reading mean? A blood pressure reading consists of a higher number over a lower number. Ideally, your blood pressure should be below 120/80. The first ("top") number is called the systolic pressure. It is a measure of  the pressure in your arteries as your heart beats. The second ("bottom") number is called the diastolic pressure. It is a measure of the pressure in your arteries as the heart relaxes. Blood pressure is classified into four stages. The following are the stages for adults who do not have a short-term serious illness or a chronic condition. Systolic pressure and diastolic pressure are measured in a unit called mm Hg. Normal  Systolic pressure: below 832.  Diastolic pressure: below 80. Elevated  Systolic pressure: 549-826.  Diastolic pressure: below 80. Hypertension stage  1  Systolic pressure: 415-830.  Diastolic pressure: 94-07. Hypertension stage 2  Systolic pressure: 680 or above.  Diastolic pressure: 90 or above. You can have prehypertension or hypertension even if only the systolic or only the diastolic number in your reading is higher than normal. Follow these instructions at home:  Check your blood pressure as often as recommended by your health care provider.  Take your monitor to the next appointment with your health care provider to make sure: ? That you are using it correctly. ? That it provides accurate readings.  Be sure you understand what your goal blood pressure numbers are.  Tell your health care provider if you are having any side effects from blood pressure medicine. Contact a health care provider if:  Your blood pressure is consistently high. Get help right away if:  Your systolic blood pressure is higher than 180.  Your diastolic blood pressure is higher than 110. This information is not intended to replace advice given to you by your health care provider. Make sure you discuss any questions you have with your health care provider. Document Released: 03/19/2016 Document Revised: 06/01/2016 Document Reviewed: 03/19/2016 Elsevier Interactive Patient Education  2018 [place] the cuff to the next visit so I can check it.

## 2018-03-28 NOTE — Progress Notes (Signed)
Patient ID: Craig Wagner, male    DOB: [birth year], 62 y.o.   MRN: [id number]  Chief Complaint  Patient presents with  . Establish Care    Allergies Patient has no known allergies.  Subjective:   Craig Wagner is a 62 y.o. male who presents to [facility] today.  HPI Here as a new patient to establish care.  Has previously been seen at [facility].  Has a history of high blood pressure, diabetes mellitus type 2, gout, and depression. He grew up in [place] and moved to [place] upon starting school.  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 Feels like concentration is not good. Feels like gets easily distracted. Has been put on medication for mood in the past, seven or eight year ago, but never felt any improvement. Mood is not good. Energy is low. Interest is low. Appetite is ok. Eat twice a day. Sleep fair. Wake up a lot at night. Weight has been stable. Lost 10 pounds in the past six months.  Had been trying to loose a few pounds.  Reports that he does feel down, depressed.  Reports has some struggles with marital relationship.  Has really not felt motivated to get his medical issues under control.  Reports that does not surprise him that his blood pressure is not controlled.  Was able to pull up a hemoglobin A1c from over 6 months ago which was approximately 9.5%.  Is not on cholesterol medication.  Denies any hypoglycemic episodes.  Just takes metformin 500 mg twice a day.  Has never been on any other diabetic medications.  No lesions on his feet.  Is active with horses and taking care of farm animals.  Denies any chest pain, shortness of breath, palpitations, or edema.  Does occasionally get a flare of his gout.  Attributes this to having several beers.  Does drink approximately 1 beer a night.  Denies any drug use or tobacco use.  Would like to get a referral to see a dermatologist. Is due to get a  colonoscopy performed.  Did have one colonoscopy which revealed diverticulosis, per patient report.  I do not have the records of this colonoscopy. Reports that he does use meloxicam and occasionally tramadol for arthritis pains in his knee.  Has been given a prescription by his previous PCP for occasional use of prednisone for his gout flares.  He reports that he will use a prednisone tablet approximately once or twice a month for gout flareup.  Needs to get a diabetic eye exam. Denies any suicidal or homicidal ideation.  Reports that his mood is down.  Believes he was tried on Lexapro in the past.  Reports that he does not have pleasure in doing activities that he used to.    Past Medical History:  Diagnosis Date  . Diverticulitis   . Gout   . Hypertension   . Irritable bowel syndrome     Past Surgical History:  Procedure Laterality Date  . compound fx tibia and fibula      Family History  Problem Relation Age of Onset  . Cancer Mother   . Heart disease Father      Social History   Socioeconomic History  . Marital status: Married    Spouse name: Not on file  . Number of children: Not on file  . Years of education: Not on file  . Highest education level: Not on file  Occupational History  . Not on file  Social Needs  . Financial resource strain: Not hard at all  . Food insecurity:    Worry: Never true    Inability: Never true  . Transportation needs:    Medical: No    Non-medical: No  Tobacco Use  . Smoking status: Never Smoker  . Smokeless tobacco: Never Used  Substance and Sexual Activity  . Alcohol use: Yes    Alcohol/week: 0.6 oz    Types: 1 Cans of beer per week  . Drug use: No  . Sexual activity: Not Currently    Partners: Male, Male  Lifestyle  . Physical activity:    Days per week: 0 days    Minutes per session: 0 min  . Stress: Not at all  Relationships  . Social connections:    Talks on phone: Once a week    Gets together: Never    Attends  religious service: More than 4 times per year    Active member of club or organization: Yes    Attends meetings of clubs or organizations: Never    Relationship status: Married  Other Topics Concern  . Not on file  Social History Narrative   Grew up in [place]    Moved to [place] for school.   Father was a Risk analyst in the [employer].   Works part-time.   Married for 28 years.    Does not have children.   Has a lot of pets.   Takes care of mules, dogs, pets.   Has a motorcycle.       Current Outpatient Medications on File Prior to Visit  Medication Sig Dispense Refill  . allopurinol (ZYLOPRIM) 100 MG tablet Take 100 mg by mouth daily.    . colchicine 0.6 MG tablet Take 0.6 mg by mouth as directed.    . finasteride (PROPECIA) 1 MG tablet Take 1 mg by mouth daily.    . meloxicam (MOBIC) 15 MG tablet Take 15 mg by mouth daily.    . metFORMIN (GLUCOPHAGE) 500 MG tablet Take 1 tablet by mouth 2 (two) times daily.  3  . predniSONE (DELTASONE) 20 MG tablet Take 20 mg by mouth 2 (two) times daily as needed (gout flare).    . traMADol (ULTRAM) 50 MG tablet Take 50 mg by mouth as directed.     No current facility-administered medications on file prior to visit.     Review of Systems  Constitutional: Negative for appetite change, chills, fever and unexpected weight change.  HENT: Negative for congestion, dental problem, ear discharge, ear pain, hearing loss, postnasal drip, rhinorrhea, sinus pressure, sinus pain, sneezing, sore throat, tinnitus, trouble swallowing and voice change.   Eyes: Negative for visual disturbance.  Respiratory: Negative for cough, chest tightness, shortness of breath and wheezing.   Cardiovascular: Negative for chest pain, palpitations and leg swelling.  Gastrointestinal: Negative for abdominal distention, abdominal pain, anal bleeding, blood in stool, constipation, diarrhea, nausea, rectal pain and vomiting.  Endocrine: Negative for polyphagia and polyuria.  Genitourinary:  Negative for decreased urine volume, difficulty urinating, discharge, dysuria, enuresis, flank pain, frequency, genital sores, hematuria, penile pain, penile swelling, scrotal swelling, testicular pain and urgency.  Musculoskeletal: Negative for back pain, joint swelling, neck pain and neck stiffness.  Skin: Negative for pallor and rash.  Neurological: Negative for dizziness, tremors, syncope, facial asymmetry, weakness, numbness and headaches.  Hematological: Negative for adenopathy. Does not bruise/bleed easily.  Psychiatric/Behavioral: Positive for decreased concentration, dysphoric mood and sleep  disturbance. Negative for agitation, behavioral problems, hallucinations, self-injury and suicidal ideas. The patient is not nervous/anxious and is not hyperactive.      Objective:   BP (!) 162/94 (BP Location: Left Arm, Patient Position: Sitting, Cuff Size: Large)   Pulse 71   Temp 98.7 F (37.1 C) (Temporal)   Resp 14   Ht 6' (1.829 m)   Wt 192 lb 0.6 oz (87.1 kg)   SpO2 98%   BMI 26.05 kg/m   Physical Exam  Constitutional: He is oriented to person, place, and time. He appears well-developed and well-nourished.  HENT:  Head: Normocephalic and atraumatic.  Right Ear: External ear normal.  Left Ear: External ear normal.  Nose: Nose normal.  Mouth/Throat: Oropharynx is clear and moist. No oropharyngeal exudate.  Eyes: Pupils are equal, round, and reactive to light. EOM are normal.  Neck: Normal range of motion. Neck supple. No thyromegaly present.  Cardiovascular: Normal rate, regular rhythm, normal heart sounds and normal pulses.  Pulses:      Carotid pulses are 2+ on the right side, and 2+ on the left side. Pulmonary/Chest: Effort normal and breath sounds normal.  Abdominal: Soft. Bowel sounds are normal. He exhibits no distension. There is no tenderness. There is no guarding.  Genitourinary: Rectum normal and prostate normal.  Musculoskeletal: Normal range of motion. He exhibits  no edema.  Neurological: He is alert and oriented to person, place, and time. He displays normal reflexes. No cranial nerve deficit or sensory deficit. He exhibits normal muscle tone. Coordination normal.  Skin: Skin is warm, dry and intact. Capillary refill takes less than 2 seconds. No erythema. No pallor.  Psychiatric: Judgment and thought content normal. His mood appears not anxious. His affect is not angry, not blunt and not inappropriate. His speech is not delayed, not tangential and not slurred. He is not agitated, not aggressive, not hyperactive, not slowed, not withdrawn and not combative. Cognition and memory are normal. He exhibits a depressed mood. He expresses no homicidal and no suicidal ideation. He expresses no suicidal plans and no homicidal plans. He is communicative. He is attentive.  Vitals reviewed.   Depression screen [facility] 2/9 03/28/2018 11/16/2017 10/07/2015  Decreased Interest 3 0 3  Down, Depressed, Hopeless 3 0 3  PHQ - 2 Score 6 0 6  Altered sleeping 2 - 3  Tired, decreased energy 2 - 3  Change in appetite 2 - 0  Feeling bad or failure about yourself  3 - 3  Trouble concentrating 3 - 2  Moving slowly or fidgety/restless 3 - 0  Suicidal thoughts 1 - 2  PHQ-9 Score 22 - 19  Difficult doing work/chores Somewhat difficult - Very difficult    Assessment and Plan  1. Well adult exam Age appropriate anticipatory guidance given and discussed with patient.  - PSA, total and free  2. Screen for colon cancer Referral placed.  - Ambulatory referral to Gastroenterology  3. Immunization due - Tdap vaccine greater than or equal to 7yo IM  4. Encounter for hepatitis C screening test for low risk patient - Hepatitis C antibody  5. Screening for HIV (human immunodeficiency virus) - HIV antibody  6. Skin lesion - Ambulatory referral to Dermatology  7. Type 2 diabetes mellitus with hyperglycemia, without long-term current use of insulin (HCC) Diet, exercise, and  -  Hemoglobin A1c - Lipid panel - COMPLETE METABOLIC PANEL WITH GFR - atorvastatin (LIPITOR) 20 MG tablet; Take 1 tablet (20 mg total) by mouth daily.  Dispense: 90 tablet; Refill: 3 - Ambulatory referral to Ophthalmology  8. Fatigue, unspecified type Most likely secondary to mood disorder. - TSH - CBC with Differential/Platelet - VITAMIN D 25 Hydroxy (Vit-D Deficiency, Fractures) - Vitamin B12  9. Essential hypertension Crease Norvasc to 10 mg p.o. daily.  Recheck in 3 to 4 weeks. Lifestyle modifications discussed with patient including a diet emphasizing vegetables, fruits, and whole grains. Limiting intake of sodium to less than 2,400 mg per day.  Recommendations discussed include consuming low-fat dairy products, poultry, fish, legumes, non-tropical vegetable oils, and nuts; and limiting intake of sweets, sugar-sweetened beverages, and red meat. Discussed following a plan such as the Dietary Approaches to Stop Hypertension (DASH) diet. Patient to read up on this diet.    10. Depression, recurrent (HCC) Start Zoloft 50 mg p.o. daily.  Follow-up in 1 month. Suicide risks evaluated and documented in note if present or in the area below.  Patient has protective factors of family and community support.  Patient reports that family believes is behaving rationally. Patient displays problem solving skills.   Patient specifically denies suicide ideation. Patient has access/information to healthcare contacts if situation or mood changes where patient is a risk to self or others or mood becomes unstable.   During the encounter, the patient had good eye contact and firm handshake regarding safety contract and agreement to seek help if mood worsens and not to harm self.   Patient understands the treatment plan and is in agreement. Agrees to keep follow up and call prior or return to clinic if needed.   - sertraline (ZOLOFT) 50 MG tablet; Take 1 tablet (50 mg total) by mouth daily.  Dispense: 30  tablet; Refill: 3  Return in about 3 weeks (around 04/18/2018). Caren Macadam, MD 03/28/2018

## 2018-03-29 ENCOUNTER — Encounter: Payer: Self-pay | Admitting: Family Medicine

## 2018-03-29 ENCOUNTER — Encounter (INDEPENDENT_AMBULATORY_CARE_PROVIDER_SITE_OTHER): Payer: Self-pay | Admitting: *Deleted

## 2018-03-29 LAB — HEMOGLOBIN A1C
EAG (MMOL/L): 8.5 (calc)
Hgb A1c MFr Bld: 7 % of total Hgb — ABNORMAL HIGH (ref <5.7)
Mean Plasma Glucose: 154 (calc)

## 2018-03-29 LAB — COMPLETE METABOLIC PANEL WITH GFR
AG Ratio: 2 (calc) (ref 1.0–2.5)
ALKALINE PHOSPHATASE (APISO): 80 U/L (ref 40–115)
ALT: 23 U/L (ref 9–46)
AST: 22 U/L (ref 10–35)
Albumin: 4.7 g/dL (ref 3.6–5.1)
BUN/Creatinine Ratio: 22 (calc) (ref 6–22)
BUN: 27 mg/dL — ABNORMAL HIGH (ref 7–25)
CALCIUM: 9.9 mg/dL (ref 8.6–10.3)
CO2: 29 mmol/L (ref 20–32)
Chloride: 102 mmol/L (ref 98–110)
Creat: 1.21 mg/dL (ref 0.70–1.33)
GFR, EST NON AFRICAN AMERICAN: 65 mL/min/{1.73_m2} (ref >=60)
GFR, Est African American: 75 mL/min/{1.73_m2} (ref >=60)
GLOBULIN: 2.3 g/dL (ref 1.9–3.7)
Glucose, Bld: 122 mg/dL — ABNORMAL HIGH (ref 65–99)
Potassium: 3.9 mmol/L (ref 3.5–5.3)
SODIUM: 139 mmol/L (ref 135–146)
Total Bilirubin: 0.7 mg/dL (ref 0.2–1.2)
Total Protein: 7 g/dL (ref 6.1–8.1)

## 2018-03-29 LAB — CBC WITH DIFFERENTIAL/PLATELET
Basophils Absolute: 60 cells/uL (ref 0–200)
Basophils Relative: 0.9 %
EOS PCT: 3.2 %
Eosinophils Absolute: 214 cells/uL (ref 15–500)
HCT: 43.7 % (ref 38.5–50.0)
Hemoglobin: 15.8 g/dL (ref 13.2–17.1)
LYMPHS ABS: 2325 {cells}/uL (ref 850–3900)
MCH: 31.1 pg (ref 27.0–33.0)
MCHC: 36.2 g/dL — AB (ref 32.0–36.0)
MCV: 86 fL (ref 80.0–100.0)
MPV: 9.9 fL (ref 7.5–12.5)
Monocytes Relative: 7.5 %
NEUTROS ABS: 3598 {cells}/uL (ref 1500–7800)
Neutrophils Relative %: 53.7 %
PLATELETS: 149 10*3/uL (ref 140–400)
RBC: 5.08 10*6/uL (ref 4.20–5.80)
RDW: 13.4 % (ref 11.0–15.0)
Total Lymphocyte: 34.7 %
WBC mixed population: 503 cells/uL (ref 200–950)
WBC: 6.7 10*3/uL (ref 3.8–10.8)

## 2018-03-29 LAB — HEPATITIS C ANTIBODY
HEP C AB: NONREACTIVE
SIGNAL TO CUT-OFF: 0.01 (ref <1.00)

## 2018-03-29 LAB — VITAMIN B12: VITAMIN B 12: 562 pg/mL (ref 200–1100)

## 2018-03-29 LAB — LIPID PANEL
CHOLESTEROL: 248 mg/dL — AB (ref <200)
HDL: 50 mg/dL (ref >40)
LDL Cholesterol (Calc): 156 mg/dL (calc) — ABNORMAL HIGH
Non-HDL Cholesterol (Calc): 198 mg/dL (calc) — ABNORMAL HIGH (ref <130)
TRIGLYCERIDES: 256 mg/dL — AB (ref <150)
Total CHOL/HDL Ratio: 5 (calc) — ABNORMAL HIGH (ref <5.0)

## 2018-03-29 LAB — VITAMIN D 25 HYDROXY (VIT D DEFICIENCY, FRACTURES): VIT D 25 HYDROXY: 31 ng/mL (ref 30–100)

## 2018-03-29 LAB — TSH: TSH: 3.16 m[IU]/L (ref 0.40–4.50)

## 2018-03-29 LAB — PSA, TOTAL AND FREE
PSA, % Free: 31 % (calc) (ref >25)
PSA, Free: 0.4 ng/mL
PSA, TOTAL: 1.3 ng/mL (ref <=4.0)

## 2018-03-29 LAB — HIV ANTIBODY (ROUTINE TESTING W REFLEX): HIV: NONREACTIVE

## 2018-03-30 ENCOUNTER — Encounter: Payer: Self-pay | Admitting: Family Medicine

## 2018-03-31 ENCOUNTER — Encounter: Payer: Self-pay | Admitting: Family Medicine

## 2018-04-04 ENCOUNTER — Encounter: Payer: Self-pay | Admitting: Family Medicine

## 2018-04-04 DIAGNOSIS — E1165 Type 2 diabetes mellitus with hyperglycemia: Secondary | ICD-10-CM

## 2018-04-06 ENCOUNTER — Ambulatory Visit (INDEPENDENT_AMBULATORY_CARE_PROVIDER_SITE_OTHER): Payer: 59 | Admitting: Internal Medicine

## 2018-04-06 ENCOUNTER — Encounter (INDEPENDENT_AMBULATORY_CARE_PROVIDER_SITE_OTHER): Payer: Self-pay | Admitting: Internal Medicine

## 2018-04-06 VITALS — BP 170/82 | HR 64 | Temp 97.7°F | Ht 72.0 in | Wt 189.1 lb

## 2018-04-06 DIAGNOSIS — K588 Other irritable bowel syndrome: Secondary | ICD-10-CM

## 2018-04-06 MED ORDER — DICYCLOMINE HCL 10 MG PO CAPS: 10.0000 mg | ORAL_CAPSULE | Freq: Two times a day (BID) | ORAL | 3 refills | Status: DC

## 2018-04-06 MED FILL — DICYCLOMINE 10 MG CAPSULE: 10 | 30 days supply | Qty: 60 | Fill #0

## 2018-04-06 NOTE — Patient Instructions (Signed)
Rx for Dicyclomine 10mg  BID. OV in 3 months.

## 2018-04-06 NOTE — Progress Notes (Signed)
   Subjective:    Patient ID: Craig Wagner, male    DOB: [birth year], 62 y.o.   MRN: [id number]  HPI Referred by Dr. Mannie Stabile for diarrhea.  Hx of IBS.  He says he has hx of IBS. Has had diarrhea x 4 weeks. He says he will have diarrhea and may skip a few days.  Has tried [employer] and Imodium, Kayopectate . Really no significant stress. Maybe has lost 5 pounds. Appetite is okay.  He has a BM x 2 a day. He has urgency. No family hx of colon cancer. His last colonoscopy was at age 24 in [place] and he had diverticula per patient. (I do not see it in epic). No rectal bleeding. Works at home on [employer].  Review of Systems Past Medical History:  Diagnosis Date  . Diverticulitis   . Gout   . Hypertension   . Irritable bowel syndrome     Past Surgical History:  Procedure Laterality Date  . compound fx tibia and fibula      No Known Allergies  Current Outpatient Medications on File Prior to Visit  Medication Sig Dispense Refill  . allopurinol (ZYLOPRIM) 100 MG tablet Take 100 mg by mouth 2 (two) times daily.     Marland Kitchen atorvastatin (LIPITOR) 20 MG tablet Take 1 tablet (20 mg total) by mouth daily. 90 tablet 3  . colchicine 0.6 MG tablet Take 0.6 mg by mouth as directed.    . finasteride (PROPECIA) 1 MG tablet Take 1 mg by mouth daily.    . meloxicam (MOBIC) 15 MG tablet Take 15 mg by mouth daily.    . metFORMIN (GLUCOPHAGE) 500 MG tablet Take 1 tablet by mouth 2 (two) times daily.  3  . predniSONE (DELTASONE) 20 MG tablet Take 20 mg by mouth 2 (two) times daily as needed (gout flare).    . sertraline (ZOLOFT) 50 MG tablet Take 1 tablet (50 mg total) by mouth daily. 30 tablet 3  . traMADol (ULTRAM) 50 MG tablet Take 50 mg by mouth as directed.     No current facility-administered medications on file prior to visit.         Objective:   Physical Exam Blood pressure (!) 170/82, pulse 64, temperature 97.7 F (36.5 C), height 6' (1.829 m), weight 189 lb 1.6 oz (85.8  kg).  Alert and oriented. Skin warm and dry. Oral mucosa is moist.   . Sclera anicteric, conjunctivae is pink. Thyroid not enlarged. No cervical lymphadenopathy. Lungs clear. Heart regular rate and rhythm.  Abdomen is soft. Bowel sounds are positive. No hepatomegaly. No abdominal masses felt. No tenderness.  No edema to lower extremities.         Assessment & Plan:  IBS/diarrhea. Am going to try him on Dicyclomine BID. OV in 3 months.

## 2018-04-07 MED ORDER — METFORMIN HCL 500 MG PO TABS
ORAL_TABLET | ORAL | 0 refills | Status: DC
Start: 2018-04-07 — End: 2020-09-29

## 2018-04-07 MED FILL — metFORMIN HCL 500 MG TABS: 500 | 90 days supply | Qty: 270 | Fill #0

## 2018-04-12 ENCOUNTER — Telehealth: Payer: Self-pay

## 2018-04-12 DIAGNOSIS — F339 Major depressive disorder, recurrent, unspecified: Secondary | ICD-10-CM

## 2018-04-12 NOTE — BH Specialist Note (Signed)
[place] Initial Clinical Assessment  MRN: [id number] NAME: Craig Wagner Date: 04/12/18    Total time: 30 minutes  Type of Contact: Type of Contact: Phone Call Initial Contact Patient consent obtained: Patient consent obtained for Virtual Visit: (N/A) Reason for Visit today: Reason for Your Call/Visit Today: [facility] Phone Intake Assessment    Treatment History Patient recently received Inpatient Treatment: Have You Recently Been in Any Inpatient Treatment (Hospital/Detox/Crisis Center/28-Day Program)?: No  Facility/Program:  NA  Date of discharge:  NA Patient currently being seen by therapist/psychiatrist: Do You Currently Have a Therapist/Psychiatrist?: No   Patient currently receiving the following services: Patient Currently Receiving the Following Services:: Medication Management   Past Psychiatric History/Hospitalization(s): Anxiety: No Bipolar Disorder: No Depression: No Mania: No Psychosis: No Schizophrenia: No Personality Disorder: No Hospitalization for psychiatric illness: No History of Electroconvulsive Shock Therapy: No Prior Suicide Attempts: No  Decreased need for sleep: No  Euphoria: No Self Injurious behaviors No Family History of mental illness: No Family History of substance abuse: No  Substance Abuse: No  DUI: No  Insomnia: No  History of violence No  Physical, sexual or emotional abuse:No  Prior outpatient mental health therapy: No       Clinical Assessment:  PHQ-9 Assessments: Depression screen [facility] 2/9 04/12/2018 03/28/2018 11/16/2017  Decreased Interest 1 3 0  Down, Depressed, Hopeless 1 3 0  PHQ - 2 Score 2 6 0  Altered sleeping 0 2 -  Tired, decreased energy 0 2 -  Change in appetite 0 2 -  Feeling bad or failure about yourself  0 3 -  Trouble concentrating 0 3 -  Moving slowly or fidgety/restless 0 3 -  Suicidal thoughts 0 1 -  PHQ-9 Score 2 22 -  Difficult doing work/chores Not difficult at all Somewhat difficult -     GAD-7 Assessments: GAD 7 : Generalized Anxiety Score 04/12/2018  Nervous, Anxious, on Edge 0  Control/stop worrying 0  Worry too much - different things 0  Trouble relaxing 0  Restless 0  Easily annoyed or irritable 0  Afraid - awful might happen 0  Total GAD 7 Score 0  Anxiety Difficulty Not difficult at all     Social Functioning Social maturity: Social Maturity: Responsible Social judgement: Social Judgement: Normal  Stress Current stressors: Current Stressors: (None Reported) Familial stressors: Familial Stressors: None Sleep: Sleep: No problems Appetite: Appetite: No problems Coping ability: Coping ability: Normal  Patient taking medications as prescribed: Patient taking medications as prescribed: Yes  Current medications:  Outpatient Encounter Medications as of 04/12/2018  Medication Sig  . allopurinol (ZYLOPRIM) 100 MG tablet Take 100 mg by mouth 2 (two) times daily.   Marland Kitchen atorvastatin (LIPITOR) 20 MG tablet Take 1 tablet (20 mg total) by mouth daily.  . colchicine 0.6 MG tablet Take 0.6 mg by mouth as directed.  . dicyclomine (BENTYL) 10 MG capsule Take 1 capsule (10 mg total) by mouth 2 (two) times daily before a meal. Twice a day before meals.  . finasteride (PROPECIA) 1 MG tablet Take 1 mg by mouth daily.  . meloxicam (MOBIC) 15 MG tablet Take 15 mg by mouth daily.  . metFORMIN (GLUCOPHAGE) 500 MG tablet Take two pills po qam and one pill po qhs  . predniSONE (DELTASONE) 20 MG tablet Take 20 mg by mouth 2 (two) times daily as needed (gout flare).  . sertraline (ZOLOFT) 50 MG tablet Take 1 tablet (50 mg total) by mouth daily.  . traMADol Veatrice Bourbon)  50 MG tablet Take 50 mg by mouth as directed.   No facility-administered encounter medications on file as of 04/12/2018.     Self-harm Behaviors Risk Assessment Self-harm risk factors: Self-harm risk factors: (NA) Patient endorses recent thoughts of harming self: Have you recently had any thoughts about harming  yourself?: No    Danger to Others Risk Assessment Danger to others risk factors: Danger to Others Risk Factors: No risk factors noted Patient endorses recent thoughts of harming others: Notification required: No need or identified person    Substance Use Assessment Patient recently consumed alcohol: Have you recently consumed alcohol?: No  Alcohol Use Disorder Identification Test (AUDIT):  Alcohol Use Disorder Test (AUDIT) 04/12/2018  1. How often do you have a drink containing alcohol? 0  2. How many drinks containing alcohol do you have on a typical day when you are drinking? 0  3. How often do you have six or more drinks on one occasion? 0  AUDIT-C Score 0  Intervention/Follow-up AUDIT Score <7 follow-up not indicated    Patient recently used drugs: Have you recently used any drugs?: No Patient is concerned about dependence or abuse of substances: Does patient seem concerned about dependence or abuse of any substance?: No      Goals, Interventions and Follow-up Plan Goals: Increase healthy adjustment to current life circumstances Interventions: Motivational Interviewing, Solution-Focused Strategies and Supportive Counseling Follow-up Plan: [facility] Phone Follow lUp   Summary of Clinical Assessment Summary:   Patient is a 62- year old male.  Patient denies issues with his current medication for depression.  Patient denies prior inpatient psychiatric hospitalization or outpatient therapy.  Patient PCP prescribes his medication   Patient denies depression or anxiety.  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  Patient reports that he is married.  Patient reports that he likes to garden for fun.      Craig Wagner, LCAS-A

## 2018-04-19 DIAGNOSIS — M25562 Pain in left knee: Secondary | ICD-10-CM | POA: Diagnosis not present

## 2018-04-20 ENCOUNTER — Ambulatory Visit: Payer: 59 | Admitting: Family Medicine

## 2018-04-20 ENCOUNTER — Other Ambulatory Visit (HOSPITAL_COMMUNITY): Payer: Self-pay | Admitting: Orthopaedic Surgery

## 2018-04-20 ENCOUNTER — Encounter: Payer: Self-pay | Admitting: Family Medicine

## 2018-04-20 ENCOUNTER — Other Ambulatory Visit: Payer: Self-pay

## 2018-04-20 VITALS — BP 130/90 | HR 63 | Temp 97.9°F | Resp 18 | Ht 72.0 in | Wt 188.1 lb

## 2018-04-20 DIAGNOSIS — Z79899 Other long term (current) drug therapy: Secondary | ICD-10-CM | POA: Diagnosis not present

## 2018-04-20 DIAGNOSIS — M25562 Pain in left knee: Secondary | ICD-10-CM

## 2018-04-20 DIAGNOSIS — E1165 Type 2 diabetes mellitus with hyperglycemia: Secondary | ICD-10-CM

## 2018-04-20 DIAGNOSIS — E785 Hyperlipidemia, unspecified: Secondary | ICD-10-CM | POA: Diagnosis not present

## 2018-04-20 DIAGNOSIS — M459 Ankylosing spondylitis of unspecified sites in spine: Secondary | ICD-10-CM

## 2018-04-20 DIAGNOSIS — Z23 Encounter for immunization: Secondary | ICD-10-CM | POA: Diagnosis not present

## 2018-04-20 DIAGNOSIS — I10 Essential (primary) hypertension: Secondary | ICD-10-CM | POA: Diagnosis not present

## 2018-04-20 DIAGNOSIS — E1169 Type 2 diabetes mellitus with other specified complication: Secondary | ICD-10-CM | POA: Diagnosis not present

## 2018-04-20 DIAGNOSIS — F339 Major depressive disorder, recurrent, unspecified: Secondary | ICD-10-CM | POA: Diagnosis not present

## 2018-04-20 DIAGNOSIS — L659 Nonscarring hair loss, unspecified: Secondary | ICD-10-CM

## 2018-04-20 MED ORDER — ALLOPURINOL 100 MG PO TABS: 100.0000 mg | ORAL_TABLET | Freq: Two times a day (BID) | ORAL | 1 refills | Status: DC

## 2018-04-20 MED ORDER — FINASTERIDE 1 MG PO TABS: 1.0000 mg | ORAL_TABLET | Freq: Every day | ORAL | 3 refills | Status: DC

## 2018-04-20 MED ORDER — KETOROLAC TROMETHAMINE 60 MG/2ML IM SOLN
60.0000 mg | Freq: Once | INTRAMUSCULAR | Status: AC
  Administered 2018-04-20: 60 mg via INTRAMUSCULAR

## 2018-04-20 MED ORDER — LOSARTAN POTASSIUM 50 MG PO TABS: 50.0000 mg | ORAL_TABLET | Freq: Every day | ORAL | 3 refills | Status: DC

## 2018-04-20 MED ORDER — SERTRALINE HCL 50 MG PO TABS: 50.0000 mg | ORAL_TABLET | Freq: Every day | ORAL | 1 refills | Status: DC

## 2018-04-20 MED ORDER — AMLODIPINE BESYLATE 10 MG PO TABS
10.0000 mg | ORAL_TABLET | Freq: Every day | ORAL | 3 refills | Status: DC
Start: 2018-04-20 — End: 2021-07-08

## 2018-04-20 MED FILL — ALLOPURINOL 100 MG TABLET: 100 | 90 days supply | Qty: 180 | Fill #0

## 2018-04-20 MED FILL — FINASTERIDE 1 MG TABLET: 1 | 90 days supply | Qty: 90 | Fill #0

## 2018-04-20 MED FILL — AMLODIPINE BESYLATE 10 MG T: 10 | 90 days supply | Qty: 90 | Fill #0

## 2018-04-20 MED FILL — LOSARTAN POTASSIUM 50 MG TA: 50 | 90 days supply | Qty: 90 | Fill #0

## 2018-04-20 NOTE — Progress Notes (Signed)
Patient ID: Craig Wagner, male    DOB: 1955-12-31, 62 y.o.   MRN: [id number]  Chief Complaint  Patient presents with  . Follow-up    3 week    Allergies Patient has no known allergies.  Subjective:   Craig Wagner is a 62 y.o. male who presents to [facility] today.  HPI Here for follow up visit.  Since he was last seen there is been taking the Norvasc 10 mg p.o. daily.  His blood pressures been running better at home but is still not to goal.  He denies any chest pain, shortness of breath, lower extremity edema, or palpitations.  He is also been taking the Zoloft 50 mg p.o. daily.  He does report some improvement of his mood.  He is not having any side effects with the medication.  He did speak with the virtual behavioral health therapist.  He is also started the cholesterol medication.  He is not having any side effects with it.  Went to orthopedics yesterday due to knee pain, has a bone spur on xray and upcoming MRI next week. Has not been able to be as active this past week due to knee pain. Followed by rheumatology for back pain/AS. Is not on any disease modifying agents. Was initially going to be on Humira but did not start b/c dental infection.  Reports that he is having terrible knee pain and would like to get an injection for this.  He did not receive a steroid injection of the knee or get pain meds from the orthopedic doctor yesterday.  He reports that he is going to call orthopedics and get them to call him in some pain medication.  He reports that the pain in his knee is a 10 out of 10.  He does not have any swelling at the knee joint.  He reports that since getting his cholesterol results back that he has made some changes in his diet.  He is cut out eating hamburgers.  He has not been checking his blood sugars but feels that they have been running better.  He has increased the metformin as directed.  He does not have any side effects with that.     Past Medical  History:  Diagnosis Date  . Diverticulitis   . Gout   . Hypertension   . Irritable bowel syndrome     Past Surgical History:  Procedure Laterality Date  . compound fx tibia and fibula      Family History  Problem Relation Age of Onset  . Cancer Mother   . Heart disease Father      Social History   Socioeconomic History  . Marital status: Married    Spouse name: Not on file  . Number of children: Not on file  . Years of education: Not on file  . Highest education level: Not on file  Occupational History  . Not on file  Social Needs  . Financial resource strain: Not hard at all  . Food insecurity:    Worry: Never true    Inability: Never true  . Transportation needs:    Medical: No    Non-medical: No  Tobacco Use  . Smoking status: Never Smoker  . Smokeless tobacco: Never Used  Substance and Sexual Activity  . Alcohol use: Yes    Alcohol/week: 0.6 oz    Types: 1 Cans of beer per week  . Drug use: No  . Sexual activity: Not Currently  Partners: Male, Male  Lifestyle  . Physical activity:    Days per week: 0 days    Minutes per session: 0 min  . Stress: Not at all  Relationships  . Social connections:    Talks on phone: Once a week    Gets together: Never    Attends religious service: More than 4 times per year    Active member of club or organization: Yes    Attends meetings of clubs or organizations: Never    Relationship status: Married  Other Topics Concern  . Not on file  Social History Narrative   Grew up in [place].    Moved to [place] for school.   Father was a Risk analyst in the [employer].   Works part-time.   Married for 28 years.    Does not have children.   Has a lot of pets.   Takes care of mules, dogs, pets.   Has a motorcycle.       Current Outpatient Medications on File Prior to Visit  Medication Sig Dispense Refill  . atorvastatin (LIPITOR) 20 MG tablet Take 1 tablet (20 mg total) by mouth daily. 90 tablet 3  . colchicine 0.6 MG tablet  Take 0.6 mg by mouth as directed.    . dicyclomine (BENTYL) 10 MG capsule Take 1 capsule (10 mg total) by mouth 2 (two) times daily before a meal. Twice a day before meals. 60 capsule 3  . meloxicam (MOBIC) 15 MG tablet Take 15 mg by mouth daily.    . metFORMIN (GLUCOPHAGE) 500 MG tablet Take two pills po qam and one pill po qhs 270 tablet 0  . predniSONE (DELTASONE) 20 MG tablet Take 20 mg by mouth 2 (two) times daily as needed (gout flare).    . traMADol (ULTRAM) 50 MG tablet Take 50 mg by mouth as directed.     No current facility-administered medications on file prior to visit.     Review of Systems  Constitutional: Negative for appetite change, chills, fever and unexpected weight change.  HENT: Negative for trouble swallowing and voice change.   Eyes: Negative for visual disturbance.  Respiratory: Negative for cough, chest tightness, shortness of breath and wheezing.   Cardiovascular: Negative for chest pain, palpitations and leg swelling.  Gastrointestinal: Negative for abdominal pain, diarrhea, nausea and vomiting.  Genitourinary: Negative for decreased urine volume, dysuria and frequency.  Musculoskeletal: Negative for neck stiffness.  Skin: Negative for rash.  Neurological: Negative for dizziness, tremors, syncope, facial asymmetry, weakness and headaches.  Hematological: Negative for adenopathy. Does not bruise/bleed easily.  Psychiatric/Behavioral: Negative for behavioral problems, decreased concentration, hallucinations, self-injury, sleep disturbance and suicidal ideas. The patient is not nervous/anxious and is not hyperactive.      Objective:   BP 130/90 (BP Location: Left Arm, Patient Position: Sitting, Cuff Size: Large)   Pulse 63   Temp 97.9 F (36.6 C) (Temporal)   Resp 18   Ht 6' (1.829 m)   Wt 188 lb 1.9 oz (85.3 kg)   SpO2 97%   BMI 25.51 kg/m   Physical Exam  Constitutional: He is oriented to person, place, and time. He appears well-developed and  well-nourished.  HENT:  Head: Normocephalic and atraumatic.  Eyes: Pupils are equal, round, and reactive to light. Conjunctivae and EOM are normal. No scleral icterus.  Cardiovascular: Normal rate, regular rhythm and normal heart sounds.  Pulmonary/Chest: Effort normal and breath sounds normal.  Musculoskeletal: He exhibits tenderness. He exhibits no edema.  Tenderness over  left anterior knee.  No visual erythema or edema at knee joint bilaterally.  Range of motion in the left leg limited secondary to pain.  Neurological: He is alert and oriented to person, place, and time. No cranial nerve deficit.  Skin: Skin is warm, dry and intact. Capillary refill takes less than 2 seconds.  Psychiatric: He has a normal mood and affect. His behavior is normal. Judgment and thought content normal.  Vitals reviewed.   Depression screen [facility] 2/9 04/20/2018 04/12/2018 03/28/2018 11/16/2017 10/07/2015  Decreased Interest 0 1 3 0 3  Down, Depressed, Hopeless 0 1 3 0 3  PHQ - 2 Score 0 2 6 0 6  Altered sleeping 0 0 2 - 3  Tired, decreased energy 0 0 2 - 3  Change in appetite 0 0 2 - 0  Feeling bad or failure about yourself  0 0 3 - 3  Trouble concentrating 0 0 3 - 2  Moving slowly or fidgety/restless 0 0 3 - 0  Suicidal thoughts 0 0 1 - 2  PHQ-9 Score 0 2 22 - 19  Difficult doing work/chores - Not difficult at all Somewhat difficult - Very difficult    Assessment and Plan  1. Type 2 diabetes mellitus with hyperglycemia, without long-term current use of insulin ([place]) Plan to check hemoglobin A1c and microalbumin in September, 3 months from last A1c.  Continue with diet, exercise, and lifestyle modifications.  Foot care discussed. - Microalbumin / creatinine urine ratio - Hemoglobin A1c - Pneumococcal conjugate vaccine 13-valent IM  2. Depression, recurrent ([place]) Continue Zoloft p.o. daily.  Continue V[facility]. Suicide risks evaluated and documented in note if present or in the area below.  Patient has  protective factors of family and community support.  Patient reports that family believes is behaving rationally. Patient displays problem solving skills.   Patient specifically denies suicide ideation. Patient has access/information to healthcare contacts if situation or mood changes where patient is a risk to self or others or mood becomes unstable.   During the encounter, the patient had good eye contact and firm handshake regarding safety contract and agreement to seek help if mood worsens and not to harm self.   Patient understands the treatment plan and is in agreement. Agrees to keep follow up and call prior or return to clinic if needed.   - sertraline (ZOLOFT) 50 MG tablet; Take 1 tablet (50 mg total) by mouth daily.  Dispense: 90 tablet; Refill: 1  3. Hyperlipidemia associated with type 2 diabetes mellitus (HCC) Continue Lipitor.  Will check FLP and 2 to 3 months.  If LDL is not to goal of less than 70 we will plan to increase as needed.  Diet and lifestyle modifications discussed. - Lipid panel  4. High risk medication use Check due to metformin, losartan, and statin use. - COMPLETE METABOLIC PANEL WITH GFR  5. Essential hypertension Blood pressure improved but not to goal.  Add losartan 50 mg p.o. daily.  Check blood pressure in 1 month.  Can continue to monitor at home in addition. - losartan (COZAAR) 50 MG tablet; Take 1 tablet (50 mg total) by mouth daily.  Dispense: 90 tablet; Refill: 3 - amLODipine (NORVASC) 10 MG tablet; Take 1 tablet (10 mg total) by mouth daily.  Dispense: 90 tablet; Refill: 3  6. Ankylosing spondylitis, unspecified site of spine [facility]) Patient requests refill but will follow up with rheumatology. - allopurinol (ZYLOPRIM) 100 MG tablet; Take 1 tablet (100 mg total) by mouth  2 (two) times daily.  Dispense: 180 tablet; Refill: 1  7. Alopecia Preventive medication for hair loss.Patient counseled in detail regarding the risks of medication. Told to call  or return to clinic if develop any worrisome signs or symptoms. Patient voiced understanding.   - finasteride (PROPECIA) 1 MG tablet; Take 1 tablet (1 mg total) by mouth daily.  Dispense: 90 tablet; Refill: 3  8. Left knee pain, unspecified chronicity Scheduled MRI and call/follow-up with Ortho as needed for pain meds/intra-articular injection. - ketorolac (TORADOL) injection 60 mg  Return in about 1 month (around 05/18/2018). Caren Macadam, MD 04/20/2018

## 2018-04-21 MED FILL — SERTRALINE HCL 50 MG TABLET: 50 | 90 days supply | Qty: 90 | Fill #0

## 2018-04-25 ENCOUNTER — Encounter (HOSPITAL_COMMUNITY): Payer: Self-pay | Admitting: Psychiatry

## 2018-04-26 ENCOUNTER — Ambulatory Visit (HOSPITAL_COMMUNITY)
Admission: RE | Admit: 2018-04-26 | Discharge: 2018-04-26 | Disposition: A | Discharge status: Home / Self Care | Payer: 59 | Source: Ambulatory Visit | Attending: Orthopaedic Surgery | Admitting: Orthopaedic Surgery

## 2018-04-26 DIAGNOSIS — M109 Gout, unspecified: Secondary | ICD-10-CM | POA: Insufficient documentation

## 2018-04-26 DIAGNOSIS — M25462 Effusion, left knee: Secondary | ICD-10-CM | POA: Insufficient documentation

## 2018-04-26 DIAGNOSIS — S83242A Other tear of medial meniscus, current injury, left knee, initial encounter: Secondary | ICD-10-CM | POA: Diagnosis not present

## 2018-04-26 DIAGNOSIS — M25562 Pain in left knee: Secondary | ICD-10-CM | POA: Diagnosis not present

## 2018-04-26 DIAGNOSIS — X58XXXA Exposure to other specified factors, initial encounter: Secondary | ICD-10-CM | POA: Diagnosis not present

## 2018-04-26 NOTE — Progress Notes (Signed)
[facility] ([place]) Psychiatric Consultant Case Review   Summary Craig Wagner is a 62 y.o. year old male with history of depression, hypertension, type II diabetes, hyperlipidemia. Unable to access to the PCP note on 6/4. Per 6/27 note, patient was started on sertraline at the previous visit. PHQ score decreased.  Psychosocial factors: n/a  Current Medications Current Outpatient Medications on File Prior to Visit  Medication Sig Dispense Refill  . allopurinol (ZYLOPRIM) 100 MG tablet Take 1 tablet (100 mg total) by mouth 2 (two) times daily. 180 tablet 1  . amLODipine (NORVASC) 10 MG tablet Take 1 tablet (10 mg total) by mouth daily. 90 tablet 3  . atorvastatin (LIPITOR) 20 MG tablet Take 1 tablet (20 mg total) by mouth daily. 90 tablet 3  . colchicine 0.6 MG tablet Take 0.6 mg by mouth as directed.    . dicyclomine (BENTYL) 10 MG capsule Take 1 capsule (10 mg total) by mouth 2 (two) times daily before a meal. Twice a day before meals. 60 capsule 3  . finasteride (PROPECIA) 1 MG tablet Take 1 tablet (1 mg total) by mouth daily. 90 tablet 3  . losartan (COZAAR) 50 MG tablet Take 1 tablet (50 mg total) by mouth daily. 90 tablet 3  . meloxicam (MOBIC) 15 MG tablet Take 15 mg by mouth daily.    . metFORMIN (GLUCOPHAGE) 500 MG tablet Take two pills po qam and one pill po qhs 270 tablet 0  . predniSONE (DELTASONE) 20 MG tablet Take 20 mg by mouth 2 (two) times daily as needed (gout flare).    . sertraline (ZOLOFT) 50 MG tablet Take 1 tablet (50 mg total) by mouth daily. 90 tablet 1  . traMADol (ULTRAM) 50 MG tablet Take 50 mg by mouth as directed.     No current facility-administered medications on file prior to visit.      Past psychiatry history Outpatient: denies Psychiatry admission: denies Previous suicide attempt: denies Past trials of medication: denies History of violence: denies  Current measures Depression screen [facility] 2/9 04/20/2018 04/12/2018 03/28/2018  11/16/2017 10/07/2015  Decreased Interest 0 1 3 0 3  Down, Depressed, Hopeless 0 1 3 0 3  PHQ - 2 Score 0 2 6 0 6  Altered sleeping 0 0 2 - 3  Tired, decreased energy 0 0 2 - 3  Change in appetite 0 0 2 - 0  Feeling bad or failure about yourself  0 0 3 - 3  Trouble concentrating 0 0 3 - 2  Moving slowly or fidgety/restless 0 0 3 - 0  Suicidal thoughts 0 0 1 - 2  PHQ-9 Score 0 2 22 - 19  Difficult doing work/chores - Not difficult at all Somewhat difficult - Very difficult   GAD 7 : Generalized Anxiety Score 04/12/2018  Nervous, Anxious, on Edge 0  Control/stop worrying 0  Worry too much - different things 0  Trouble relaxing 0  Restless 0  Easily annoyed or irritable 0  Afraid - awful might happen 0  Total GAD 7 Score 0  Anxiety Difficulty Not difficult at all    Goals (patient centered) Increase healthy adjustment to current life circumstances  Assessment/Provisional Diagnosis # MDD Will continue current dose of sertraline given patient no significant residual mood symptoms.   Recommendation - Continue sertraline 50 mg daily. Would recommend to continue current dose at least 6-9 months to avoid relapse, then coinsider tapering it off in the future. - [place] specialist to discuss relapse  prevention   Thank you for your consult. We will continue to follow the patient. Please contact [place]  for any questions or concerns.   The above treatment considerations and suggestions are based on consultation with the [facility] specialist and/or PCP and a review of information available in the shared registry and the patient's [place] Record (EHR). I have not personally examined the patient. All recommendations should be implemented with consideration of the patient's relevant prior history and current clinical status. Please feel free to call me with any questions about the care of this patient.

## 2018-04-26 NOTE — Telephone Encounter (Signed)
This encounter was created in error - please disregard.

## 2018-05-01 DIAGNOSIS — M25562 Pain in left knee: Secondary | ICD-10-CM | POA: Diagnosis not present

## 2018-05-22 MED FILL — DICYCLOMINE 10 MG CAPSULE: 10 | 30 days supply | Qty: 60 | Fill #1

## 2018-06-02 ENCOUNTER — Telehealth: Payer: Self-pay

## 2018-06-02 NOTE — Telephone Encounter (Signed)
Patient declines V[facility] services

## 2018-06-08 ENCOUNTER — Ambulatory Visit (INDEPENDENT_AMBULATORY_CARE_PROVIDER_SITE_OTHER): Payer: 59 | Admitting: Family Medicine

## 2018-06-08 ENCOUNTER — Encounter: Payer: Self-pay | Admitting: Family Medicine

## 2018-06-08 VITALS — BP 124/78 | HR 74 | Ht 72.0 in | Wt 183.0 lb

## 2018-06-08 DIAGNOSIS — F339 Major depressive disorder, recurrent, unspecified: Secondary | ICD-10-CM | POA: Insufficient documentation

## 2018-06-08 DIAGNOSIS — Z23 Encounter for immunization: Secondary | ICD-10-CM | POA: Diagnosis not present

## 2018-06-08 DIAGNOSIS — E1169 Type 2 diabetes mellitus with other specified complication: Secondary | ICD-10-CM | POA: Insufficient documentation

## 2018-06-08 DIAGNOSIS — E1165 Type 2 diabetes mellitus with hyperglycemia: Secondary | ICD-10-CM

## 2018-06-08 DIAGNOSIS — I10 Essential (primary) hypertension: Secondary | ICD-10-CM | POA: Insufficient documentation

## 2018-06-08 DIAGNOSIS — Z79899 Other long term (current) drug therapy: Secondary | ICD-10-CM | POA: Diagnosis not present

## 2018-06-08 DIAGNOSIS — E785 Hyperlipidemia, unspecified: Secondary | ICD-10-CM | POA: Diagnosis not present

## 2018-06-08 DIAGNOSIS — E1122 Type 2 diabetes mellitus with diabetic chronic kidney disease: Secondary | ICD-10-CM | POA: Insufficient documentation

## 2018-06-08 NOTE — Progress Notes (Signed)
Patient ID: Craig Wagner, male    DOB: February 09, 1956, 62 y.o.   MRN: [id number]  Chief Complaint  Patient presents with  . Hyperlipidemia    follow up  . Hypertension    Allergies Patient has no known allergies.  Subjective:   Craig Wagner is a 62 y.o. male who presents to [facility] today.  HPI Here for follow up. Reports that had MRI for his knee since the last visit and was told that had gout in his knee. Has been doing well. BP has been running well at home.  Reports that his blood pressure has been running in the 120s over 70s at home.  Reports that he feels good.  Energy is good.  Mood is good.  Is not having side effects with the Zoloft.  Is not regularly checking blood sugars.  Has improved his eating habits.  Is taking the cholesterol medication nightly.  No myalgias or side effects.  Sleeping well.  Does not feel down, depressed, or hopeless.  Denies any hypoglycemic episodes.  No lesions on his feet.  No skin lesions.  Diabetic eye exam up-to-date.  Is not having any side effects with the blood pressure medication.  Denies any chest pain, shortness of breath, swelling in extremities, or palpitations.  Has been taking losartan 50 mg a day.  Is also taking the Norvasc 10 mg p.o. daily.  Is keeping scheduled visits with his orthopedic and his rheumatologist positions.  Does not have any other changes in his medication.  He reports he has been compliant.  Has not gotten labs done but is due to get those done the first week of September.   Past Medical History:  Diagnosis Date  . Diverticulitis   . Gout   . Hypertension   . Irritable bowel syndrome     Past Surgical History:  Procedure Laterality Date  . compound fx tibia and fibula      Family History  Problem Relation Age of Onset  . Cancer Mother   . Heart disease Father      Social History   Socioeconomic History  . Marital status: Married    Spouse name: Not on file  . Number of children: Not  on file  . Years of education: Not on file  . Highest education level: Not on file  Occupational History  . Not on file  Social Needs  . Financial resource strain: Not hard at all  . Food insecurity:    Worry: Never true    Inability: Never true  . Transportation needs:    Medical: No    Non-medical: No  Tobacco Use  . Smoking status: Never Smoker  . Smokeless tobacco: Never Used  Substance and Sexual Activity  . Alcohol use: Yes    Alcohol/week: 1.0 standard drinks    Types: 1 Cans of beer per week  . Drug use: No  . Sexual activity: Not Currently    Partners: Male, Male  Lifestyle  . Physical activity:    Days per week: 0 days    Minutes per session: 0 min  . Stress: Not at all  Relationships  . Social connections:    Talks on phone: Once a week    Gets together: Never    Attends religious service: More than 4 times per year    Active member of club or organization: Yes    Attends meetings of clubs or organizations: Never    Relationship status: Married  Other  Topics Concern  . Not on file  Social History Narrative   Grew up in [place]    Moved to [place] for school.   Father was a Risk analyst in the [employer].   Works part-time.   Married for 28 years.    Does not have children.   Has a lot of pets.   Takes care of mules, dogs, pets.   Has a motorcycle.       Current Outpatient Medications on File Prior to Visit  Medication Sig Dispense Refill  . allopurinol (ZYLOPRIM) 100 MG tablet Take 1 tablet (100 mg total) by mouth 2 (two) times daily. 180 tablet 1  . amLODipine (NORVASC) 10 MG tablet Take 1 tablet (10 mg total) by mouth daily. 90 tablet 3  . atorvastatin (LIPITOR) 20 MG tablet Take 1 tablet (20 mg total) by mouth daily. 90 tablet 3  . colchicine 0.6 MG tablet Take 0.6 mg by mouth as directed.    . dicyclomine (BENTYL) 10 MG capsule Take 1 capsule (10 mg total) by mouth 2 (two) times daily before a meal. Twice a day before meals. 60 capsule 3  . finasteride  (PROPECIA) 1 MG tablet Take 1 tablet (1 mg total) by mouth daily. 90 tablet 3  . losartan (COZAAR) 50 MG tablet Take 1 tablet (50 mg total) by mouth daily. 90 tablet 3  . metFORMIN (GLUCOPHAGE) 500 MG tablet Take two pills po qam and one pill po qhs 270 tablet 0  . predniSONE (DELTASONE) 20 MG tablet Take 20 mg by mouth 2 (two) times daily as needed (gout flare).    . sertraline (ZOLOFT) 50 MG tablet Take 1 tablet (50 mg total) by mouth daily. 90 tablet 1  . traMADol (ULTRAM) 50 MG tablet Take 50 mg by mouth as directed.    . meloxicam (MOBIC) 15 MG tablet Take 15 mg by mouth daily.     No current facility-administered medications on file prior to visit.     Review of Systems  Constitutional: Negative for appetite change, chills, fever and unexpected weight change.  HENT: Negative for trouble swallowing and voice change.   Eyes: Negative for visual disturbance.  Respiratory: Negative for cough, chest tightness, shortness of breath and wheezing.   Cardiovascular: Negative for chest pain, palpitations and leg swelling.  Gastrointestinal: Negative for abdominal pain, diarrhea, nausea and vomiting.  Genitourinary: Negative for decreased urine volume, dysuria and frequency.  Skin: Negative for rash.  Neurological: Negative for dizziness, tremors, syncope, facial asymmetry, weakness and headaches.  Hematological: Negative for adenopathy. Does not bruise/bleed easily.     Objective:   BP 124/78 (BP Location: Left Arm, Patient Position: Sitting, Cuff Size: Normal)   Pulse 74   Ht 6' (1.829 m)   Wt 183 lb (83 kg)   SpO2 98%   BMI 24.82 kg/m   Physical Exam  Constitutional: He is oriented to person, place, and time. He appears well-developed and well-nourished.  HENT:  Head: Normocephalic and atraumatic.  Mouth/Throat: Oropharynx is clear and moist.  Eyes: Pupils are equal, round, and reactive to light. Conjunctivae and EOM are normal.  Cardiovascular: Normal rate, regular rhythm and  normal heart sounds.  Pulmonary/Chest: Effort normal and breath sounds normal.  Neurological: He is alert and oriented to person, place, and time.  Skin: Skin is warm and dry. Capillary refill takes less than 2 seconds.  Psychiatric: He has a normal mood and affect. His behavior is normal. Judgment and thought content normal.  Vitals reviewed.  Depression screen [facility] 2/9 04/20/2018 04/12/2018 03/28/2018 11/16/2017 10/07/2015  Decreased Interest 0 1 3 0 3  Down, Depressed, Hopeless 0 1 3 0 3  PHQ - 2 Score 0 2 6 0 6  Altered sleeping 0 0 2 - 3  Tired, decreased energy 0 0 2 - 3  Change in appetite 0 0 2 - 0  Feeling bad or failure about yourself  0 0 3 - 3  Trouble concentrating 0 0 3 - 2  Moving slowly or fidgety/restless 0 0 3 - 0  Suicidal thoughts 0 0 1 - 2  PHQ-9 Score 0 2 22 - 19  Difficult doing work/chores - Not difficult at all Somewhat difficult - Very difficult    Assessment and Plan  1. Essential hypertension Improved. Continue norvasc and losartan. RTC for labs as directed. Lifestyle modifications discussed with patient including a diet emphasizing vegetables, fruits, and whole grains. Limiting intake of sodium to less than 2,400 mg per day.  Recommendations discussed include consuming low-fat dairy products, poultry, fish, legumes, non-tropical vegetable oils, and nuts; and limiting intake of sweets, sugar-sweetened beverages, and red meat. Discussed following a plan such as the Dietary Approaches to Stop Hypertension (DASH) diet. Patient to read up on this diet.  Patient counseled in detail regarding the risks of medication. Told to call or return to clinic if develop any worrisome signs or symptoms. Patient voiced understanding.    2. Hyperlipidemia associated with type 2 diabetes mellitus ([place]) RTC to check lipids to see if with statin LDL is at goal.  Goal is less than 70 due to DM.  Hyperlipidemia and the associated risk of ASCVD were discussed today. Primary vs.  Secondary prevention of ASCVD were discussed and how it relates to patient morbidity, mortality, and quality of life. Shared decision making with patient including the risks of statins vs.benefits of ASCVD risk reduction discussed.  Risks of stains discussed including myopathy, rhabdomyoloysis, liver problems, increased risk of diabetes discussed. We discussed heart healthy diet, lifestyle modifications, risk factor modifications, and adherence to the recommended treatment plan. We discussed the need to periodically monitor lipid panel and liver function tests while on statin therapy.    3. High risk medication use Check BMP.   4. Depression, recurrent ([place]) Stable The current medical regimen is effective;  continue present plan and medications. Continue zoloft.  Call with question, concerns, or mood changes.  Suicide risks evaluated and documented in note if present or in the area below.  Patient has protective factors of family and community support.  Patient reports that family believes is behaving rationally. Patient displays problem solving skills.   Patient specifically denies suicide ideation. Patient has access/information to healthcare contacts if situation or mood changes where patient is a risk to self or others or mood becomes unstable.  Patient understands the treatment plan and is in agreement. Agrees to keep follow up and call prior or return to clinic if needed.    5. Type 2 diabetes mellitus with hyperglycemia, without long-term current use of insulin ([place]) Check labs. A1c due 06/29/03.  Patient counseled in detail regarding the risks of medication. Told to call or return to clinic if develop any worrisome signs or symptoms. Patient voiced understanding.  Foot care discussed. Routine DM health maintenance.   No follow-ups on file.check labs after 9/4/20019 and follow up with new PCP office in 2-3 months. Call with questions or concerns.  Caren Macadam, MD 06/08/2018

## 2018-06-12 ENCOUNTER — Encounter: Payer: Self-pay | Admitting: Family Medicine

## 2018-06-14 MED FILL — predniSONE 20 MG TABS: 20 | 6 days supply | Qty: 12 | Fill #0

## 2018-06-28 DIAGNOSIS — H524 Presbyopia: Secondary | ICD-10-CM | POA: Diagnosis not present

## 2018-06-28 DIAGNOSIS — H52203 Unspecified astigmatism, bilateral: Secondary | ICD-10-CM | POA: Diagnosis not present

## 2018-06-28 DIAGNOSIS — H5213 Myopia, bilateral: Secondary | ICD-10-CM | POA: Diagnosis not present

## 2018-06-28 LAB — HM DIABETES EYE EXAM

## 2018-07-06 ENCOUNTER — Telehealth: Payer: Self-pay

## 2018-07-06 DIAGNOSIS — I10 Essential (primary) hypertension: Secondary | ICD-10-CM | POA: Diagnosis not present

## 2018-07-06 DIAGNOSIS — E1169 Type 2 diabetes mellitus with other specified complication: Secondary | ICD-10-CM | POA: Diagnosis not present

## 2018-07-06 DIAGNOSIS — E785 Hyperlipidemia, unspecified: Secondary | ICD-10-CM

## 2018-07-06 DIAGNOSIS — E1165 Type 2 diabetes mellitus with hyperglycemia: Secondary | ICD-10-CM | POA: Diagnosis not present

## 2018-07-06 NOTE — Telephone Encounter (Signed)
Labs ordered per Dr.Hagler

## 2018-07-07 LAB — COMPLETE METABOLIC PANEL WITH GFR
AG Ratio: 2 (calc) (ref 1.0–2.5)
ALKALINE PHOSPHATASE (APISO): 76 U/L (ref 40–115)
ALT: 23 U/L (ref 9–46)
AST: 16 U/L (ref 10–35)
Albumin: 4.3 g/dL (ref 3.6–5.1)
BILIRUBIN TOTAL: 0.4 mg/dL (ref 0.2–1.2)
BUN: 23 mg/dL (ref 7–25)
CHLORIDE: 106 mmol/L (ref 98–110)
CO2: 28 mmol/L (ref 20–32)
Calcium: 9.5 mg/dL (ref 8.6–10.3)
Creat: 1.14 mg/dL (ref 0.70–1.25)
GFR, EST AFRICAN AMERICAN: 81 mL/min/{1.73_m2} (ref >=60)
GFR, Est Non African American: 70 mL/min/{1.73_m2} (ref >=60)
GLUCOSE: 127 mg/dL (ref 65–139)
Globulin: 2.1 g/dL (calc) (ref 1.9–3.7)
Potassium: 4.3 mmol/L (ref 3.5–5.3)
Sodium: 142 mmol/L (ref 135–146)
TOTAL PROTEIN: 6.4 g/dL (ref 6.1–8.1)

## 2018-07-07 LAB — HEMOGLOBIN A1C
EAG (MMOL/L): 7.7 (calc)
HEMOGLOBIN A1C: 6.5 %{Hb} — AB (ref <5.7)
MEAN PLASMA GLUCOSE: 140 (calc)

## 2018-07-07 LAB — LIPID PANEL
CHOLESTEROL: 133 mg/dL (ref <200)
HDL: 36 mg/dL — ABNORMAL LOW (ref >40)
LDL Cholesterol (Calc): 65 mg/dL (calc)
Non-HDL Cholesterol (Calc): 97 mg/dL (calc) (ref <130)
Total CHOL/HDL Ratio: 3.7 (calc) (ref <5.0)
Triglycerides: 300 mg/dL — ABNORMAL HIGH (ref <150)

## 2018-07-10 ENCOUNTER — Ambulatory Visit (INDEPENDENT_AMBULATORY_CARE_PROVIDER_SITE_OTHER): Payer: 59 | Admitting: Internal Medicine

## 2018-07-11 MED FILL — ATORVASTATIN CALCIUM 20 MG: 20 | 90 days supply | Qty: 90 | Fill #1

## 2018-07-12 ENCOUNTER — Encounter: Payer: Self-pay | Admitting: Family Medicine

## 2018-07-27 ENCOUNTER — Other Ambulatory Visit: Payer: Self-pay | Admitting: Family Medicine

## 2018-07-27 DIAGNOSIS — E1165 Type 2 diabetes mellitus with hyperglycemia: Secondary | ICD-10-CM

## 2018-07-27 MED FILL — FINASTERIDE 1 MG TABLET: 1 | 90 days supply | Qty: 90 | Fill #1

## 2018-07-27 MED FILL — SERTRALINE HCL 50 MG TABLET: 50 | 90 days supply | Qty: 90 | Fill #1

## 2018-07-27 MED FILL — AMLODIPINE BESYLATE 10 MG T: 10 | 90 days supply | Qty: 90 | Fill #1

## 2018-07-27 MED FILL — LOSARTAN POTASSIUM 50 MG TA: 50 | 90 days supply | Qty: 90 | Fill #1

## 2018-07-28 ENCOUNTER — Other Ambulatory Visit: Payer: Self-pay

## 2018-07-28 DIAGNOSIS — E1165 Type 2 diabetes mellitus with hyperglycemia: Secondary | ICD-10-CM

## 2018-07-28 MED FILL — metFORMIN HCL 500 MG TABS: 500 | 90 days supply | Qty: 180 | Fill #0

## 2018-09-29 MED FILL — FREESTYLE LANCETS: 90 days supply | Qty: 100 | Fill #0

## 2018-09-29 MED FILL — FREESTYLE LITE METER: 30 days supply | Qty: 1 | Fill #0

## 2018-09-29 MED FILL — FREESTYLE LITE TEST STRIP: 90 days supply | Qty: 100 | Fill #0

## 2018-10-24 DIAGNOSIS — D2262 Melanocytic nevi of left upper limb, including shoulder: Secondary | ICD-10-CM | POA: Diagnosis not present

## 2018-10-24 DIAGNOSIS — C44619 Basal cell carcinoma of skin of left upper limb, including shoulder: Secondary | ICD-10-CM | POA: Diagnosis not present

## 2018-10-24 DIAGNOSIS — D2372 Other benign neoplasm of skin of left lower limb, including hip: Secondary | ICD-10-CM | POA: Diagnosis not present

## 2018-10-24 DIAGNOSIS — C44712 Basal cell carcinoma of skin of right lower limb, including hip: Secondary | ICD-10-CM | POA: Diagnosis not present

## 2018-10-24 DIAGNOSIS — L821 Other seborrheic keratosis: Secondary | ICD-10-CM | POA: Diagnosis not present

## 2018-10-24 DIAGNOSIS — D2261 Melanocytic nevi of right upper limb, including shoulder: Secondary | ICD-10-CM | POA: Diagnosis not present

## 2018-10-24 DIAGNOSIS — D485 Neoplasm of uncertain behavior of skin: Secondary | ICD-10-CM | POA: Diagnosis not present

## 2018-10-24 DIAGNOSIS — D1801 Hemangioma of skin and subcutaneous tissue: Secondary | ICD-10-CM | POA: Diagnosis not present

## 2018-10-24 DIAGNOSIS — D225 Melanocytic nevi of trunk: Secondary | ICD-10-CM | POA: Diagnosis not present

## 2018-10-27 DIAGNOSIS — H5213 Myopia, bilateral: Secondary | ICD-10-CM | POA: Diagnosis not present

## 2018-11-09 MED FILL — LOSARTAN POTASSIUM 50 MG TA: 50 | 90 days supply | Qty: 90 | Fill #2

## 2018-11-09 MED FILL — ALLOPURINOL 100 MG TABS: 100 | 90 days supply | Qty: 180 | Fill #1

## 2018-11-09 MED FILL — FINASTERIDE 1 MG TABLET: 1 | 90 days supply | Qty: 90 | Fill #2

## 2018-11-09 MED FILL — metFORMIN HCL 500 MG TABS: 500 | 90 days supply | Qty: 180 | Fill #1

## 2018-11-21 DIAGNOSIS — D225 Melanocytic nevi of trunk: Secondary | ICD-10-CM | POA: Diagnosis not present

## 2018-11-21 DIAGNOSIS — D485 Neoplasm of uncertain behavior of skin: Secondary | ICD-10-CM | POA: Diagnosis not present

## 2018-11-21 DIAGNOSIS — C44712 Basal cell carcinoma of skin of right lower limb, including hip: Secondary | ICD-10-CM | POA: Diagnosis not present

## 2018-11-21 MED FILL — MUPIROCIN 2% OINTMENT: 2 | 7 days supply | Qty: 22 | Fill #0

## 2018-11-23 MED FILL — ATORVASTATIN CALCIUM 20 MG: 20 | 90 days supply | Qty: 90 | Fill #2

## 2018-12-08 MED FILL — AMLODIPINE BESYLATE 10 MG T: 10 | 90 days supply | Qty: 90 | Fill #2

## 2018-12-15 DIAGNOSIS — E782 Mixed hyperlipidemia: Secondary | ICD-10-CM | POA: Diagnosis not present

## 2018-12-15 DIAGNOSIS — E1169 Type 2 diabetes mellitus with other specified complication: Secondary | ICD-10-CM | POA: Diagnosis not present

## 2018-12-15 DIAGNOSIS — F3341 Major depressive disorder, recurrent, in partial remission: Secondary | ICD-10-CM | POA: Diagnosis not present

## 2018-12-15 DIAGNOSIS — I10 Essential (primary) hypertension: Secondary | ICD-10-CM | POA: Diagnosis not present

## 2018-12-15 IMAGING — MR MR KNEE*L* W/O CM
4 of 6 series · 18 of 40 positions shown · non-contrast
Comparison: MRI left thigh 01/16/2007

CLINICAL DATA: Chronic left knee pain anteriorly. No recent injury.
History of gout.

EXAM:
MRI OF THE LEFT KNEE WITHOUT CONTRAST
TECHNIQUE: Multiplanar, multisequence MR imaging of the knee was performed. No
intravenous contrast was administered.

[Series 4: t2fs axial · axial · 4.0mm · 0.50mm/px · z∈[-67,+68]mm · 5 of 28 slices shown]
[im 1/28]
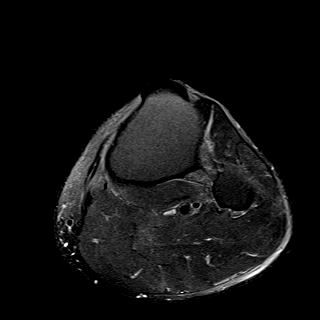
[im 7/28]
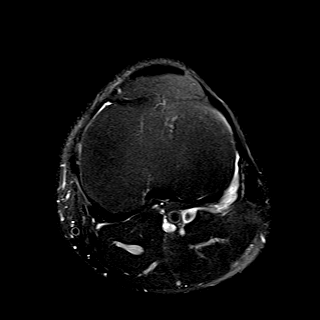
[im 14/28]
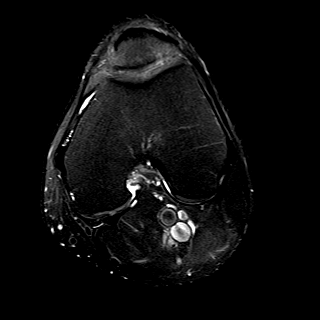
[im 21/28]
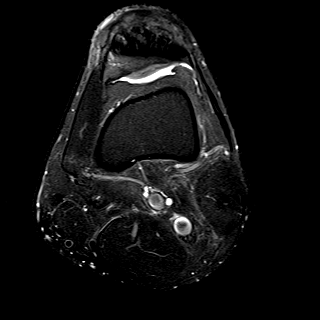
[im 28/28]
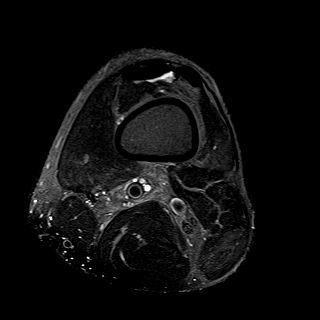

[Series 5: T1 · coronal · 4.0mm · 0.29mm/px · 7 of 30 slices shown]
[im 1/30]
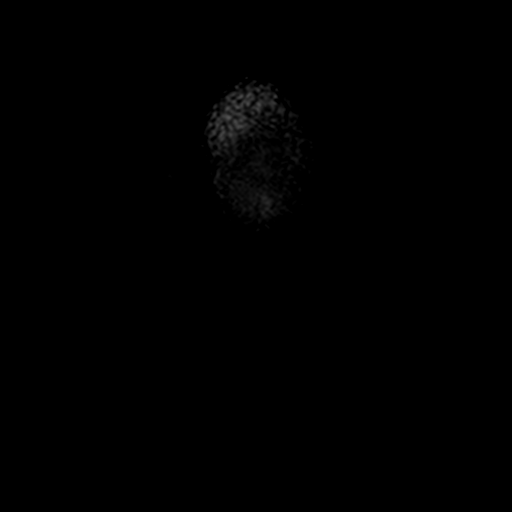
[im 5/30]
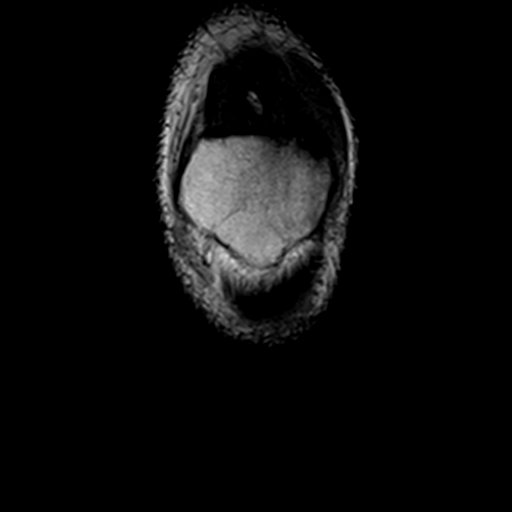
[im 10/30]
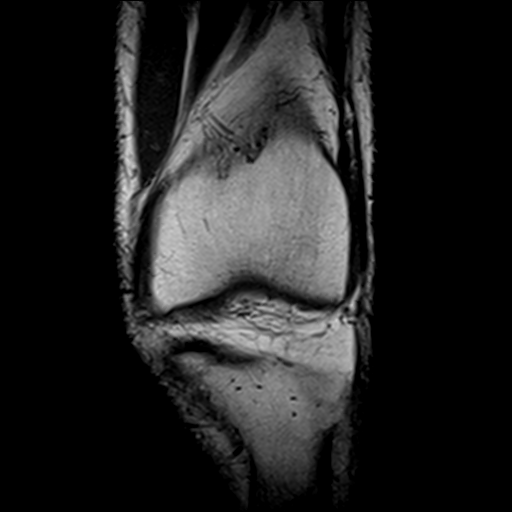
[im 15/30]
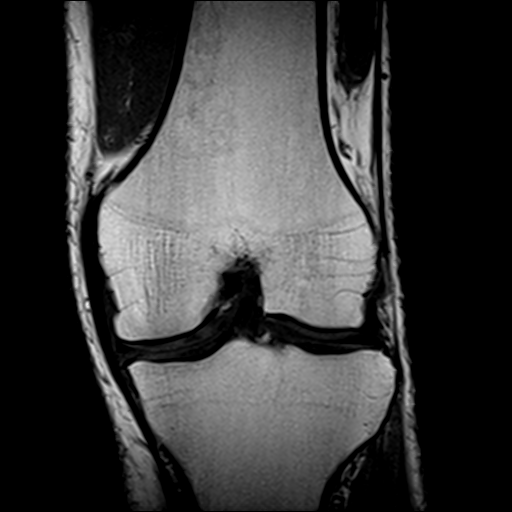
[im 20/30]
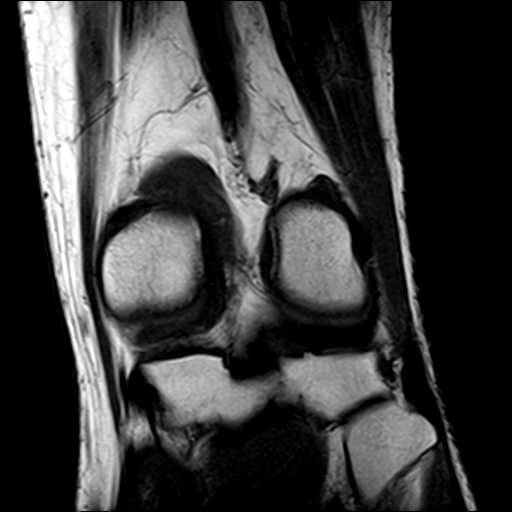
[im 25/30]
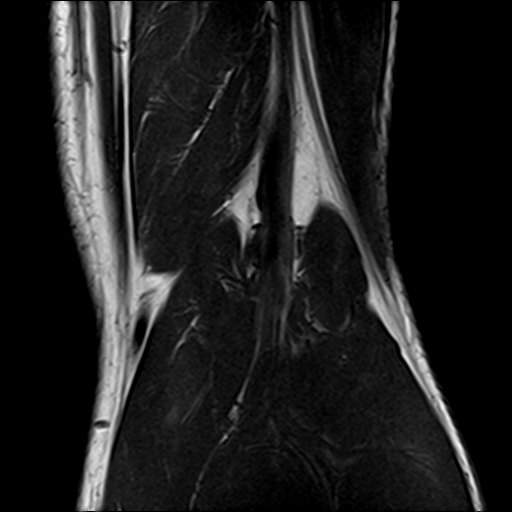
[im 30/30]
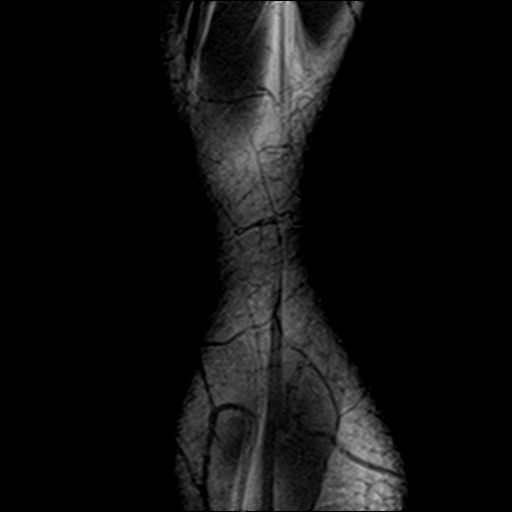

[Series 6: pdfs sag · sagittal · 3.0mm · 0.23mm/px · 3 of 29 slices shown]
[im 5/29]
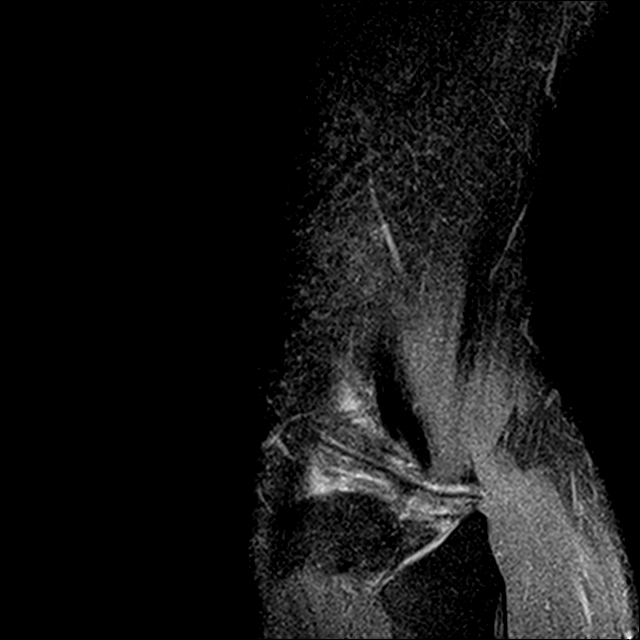
[im 15/29]
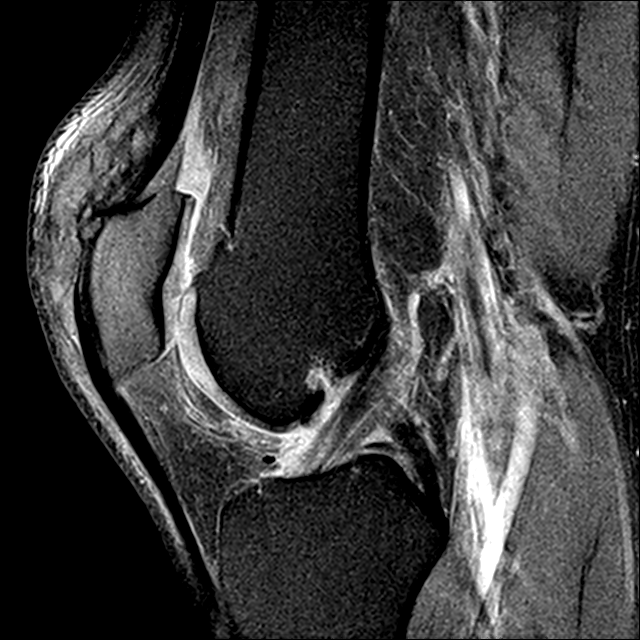
[im 24/29]
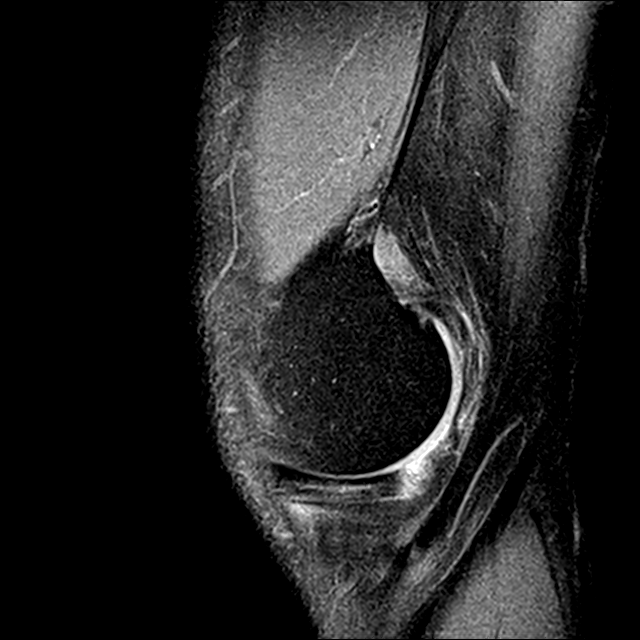

[Series 7: t2fs sag for · sagittal · 3.0mm · 0.25mm/px · 3 of 29 slices shown]
[im 5/29]
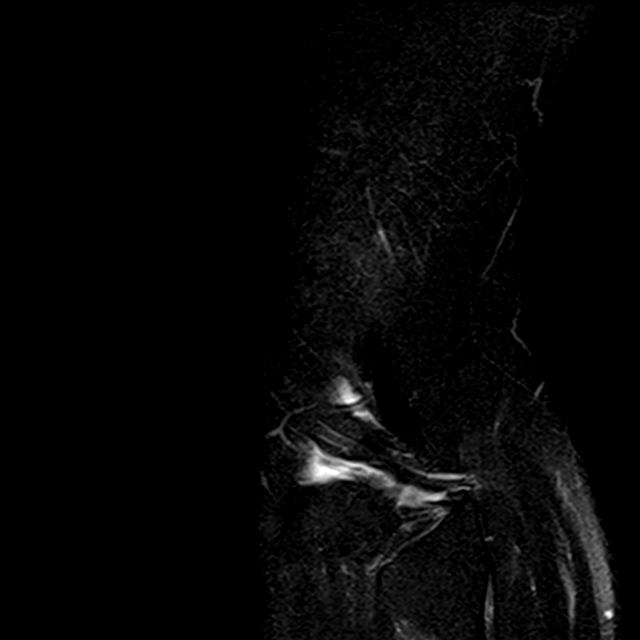
[im 15/29]
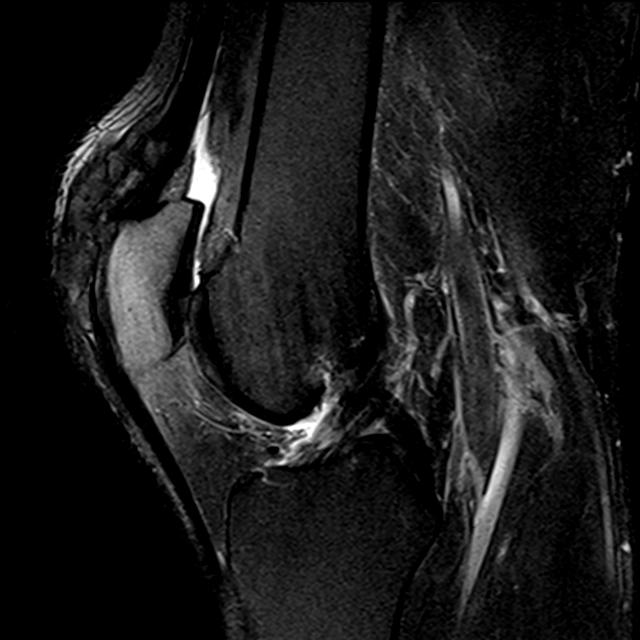
[im 24/29]
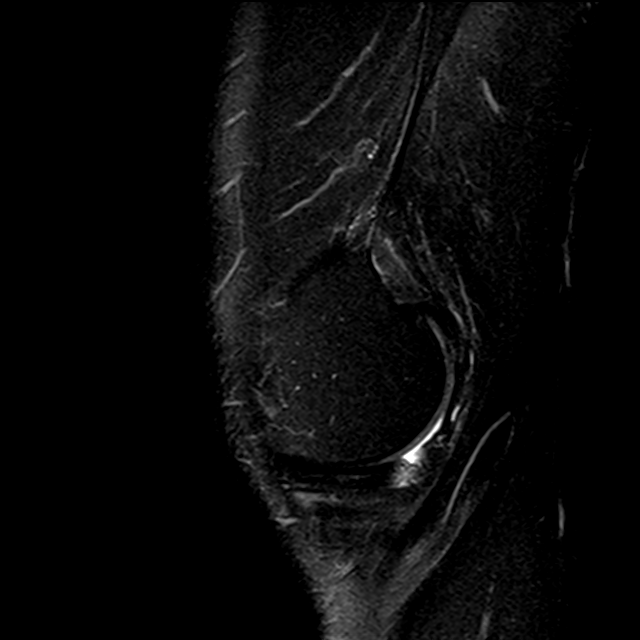

[18 of 40 positions shown; findings below may reference images not displayed]

FINDINGS: MENISCI

Medial meniscus: Horizontal cleavage-type tear involving the midbody
region contacting the inferior articular surface near the free edge.

Lateral meniscus:  Intact

LIGAMENTS

Cruciates:  Intact

Collaterals:  Intact

CARTILAGE

Patellofemoral:  Intact

Medial: Moderate degenerative chondrosis mainly involving the medial
femoral condyle articular cartilage. Chondral fissuring, fraying and
thinning without full-thickness defect.

Lateral:  Mild to moderate degenerative chondrosis.

Joint: Small joint effusion. No significant synovitis. No obvious
erosions.

Popliteal Fossa:  No popliteal mass or Baker's cyst.

Extensor Mechanism: The patella retinacular structures are intact.
The quadriceps and patellar tendons are intact.

Large irregular masslike lesion involving the distal quadriceps
tendon and extending into the soft tissues of the olecranon bursa
anterior to the patella. The patellar tendon is intact. This has
heterogeneous T1 and T2 signal intensity. Given the patient's
history of gout I think this is most likely gouty involvement of the
quadriceps tendon and bursa. It is also possible it could be some
type of fibromatosis but I think that is less likely. I can see
early changes of this process back on the prior MRI from 1992.

Bones: There appear to be mild erosive type changes involving the
anterior aspect of the patella associated with the "mass". No stress
fracture or osteochondral lesions.

Other: Normal knee musculature.
IMPRESSION: 1. 5 cm masslike lesion involving the distal quadriceps tendon and
prepatellar space/bursa. Given the patient's history of gout I think
this is most likely tophaceous involvement of the quadriceps tendon
and bursa. I believe I can see early changes of this back on a prior
MRI from [DATE]. Medial meniscus tear.
3. Intact ligamentous structures and no acute bony findings.
4. Moderate medial and lateral compartment degenerative chondrosis.
5. Small joint effusion.

## 2018-12-15 MED FILL — SERTRALINE HCL 100 MG TAB: 100 | 90 days supply | Qty: 90 | Fill #0

## 2018-12-18 MED FILL — FARXIGA 5 MG TABLET: 5 | 30 days supply | Qty: 30 | Fill #0 | Status: TO

## 2018-12-19 MED FILL — metFORMIN HCL ER 500 MG TB2: 500 | 30 days supply | Qty: 360 | Fill #0

## 2019-01-24 MED FILL — FARXIGA 5 MG TABLET: 5 | 30 days supply | Qty: 30 | Fill #0

## 2019-02-01 MED FILL — metFORMIN HCL ER 500 MG TB2: 500 | 90 days supply | Qty: 360 | Fill #0

## 2019-02-28 MED FILL — FREESTYLE LITE TEST STRIP: 90 days supply | Qty: 100 | Fill #0

## 2019-02-28 MED FILL — FARXIGA 5 MG TABLET: 5 | 30 days supply | Qty: 30 | Fill #0

## 2019-03-01 DIAGNOSIS — E782 Mixed hyperlipidemia: Secondary | ICD-10-CM | POA: Diagnosis not present

## 2019-03-01 DIAGNOSIS — F3341 Major depressive disorder, recurrent, in partial remission: Secondary | ICD-10-CM | POA: Diagnosis not present

## 2019-03-01 DIAGNOSIS — E1169 Type 2 diabetes mellitus with other specified complication: Secondary | ICD-10-CM | POA: Diagnosis not present

## 2019-03-01 DIAGNOSIS — Z7984 Long term (current) use of oral hypoglycemic drugs: Secondary | ICD-10-CM | POA: Diagnosis not present

## 2019-03-01 DIAGNOSIS — M457 Ankylosing spondylitis of lumbosacral region: Secondary | ICD-10-CM | POA: Diagnosis not present

## 2019-03-28 DIAGNOSIS — F3341 Major depressive disorder, recurrent, in partial remission: Secondary | ICD-10-CM | POA: Diagnosis not present

## 2019-03-28 DIAGNOSIS — I10 Essential (primary) hypertension: Secondary | ICD-10-CM | POA: Diagnosis not present

## 2019-03-28 DIAGNOSIS — M109 Gout, unspecified: Secondary | ICD-10-CM | POA: Diagnosis not present

## 2019-03-28 DIAGNOSIS — E1169 Type 2 diabetes mellitus with other specified complication: Secondary | ICD-10-CM | POA: Diagnosis not present

## 2019-03-28 DIAGNOSIS — Z0000 Encounter for general adult medical examination without abnormal findings: Secondary | ICD-10-CM | POA: Diagnosis not present

## 2019-03-28 DIAGNOSIS — M457 Ankylosing spondylitis of lumbosacral region: Secondary | ICD-10-CM | POA: Diagnosis not present

## 2019-03-28 DIAGNOSIS — E782 Mixed hyperlipidemia: Secondary | ICD-10-CM | POA: Diagnosis not present

## 2019-03-30 MED FILL — LOSARTAN POTASSIUM 50 MG TA: 50 | 90 days supply | Qty: 90 | Fill #0

## 2019-03-30 MED FILL — metFORMIN HCL ER 750 MG TB2: 750 | 30 days supply | Qty: 60 | Fill #0

## 2019-03-30 MED FILL — ATORVASTATIN 20 MG TABLET: 20 | 90 days supply | Qty: 90 | Fill #0

## 2019-03-30 MED FILL — SERTRALINE HCL 100 MG TAB: 100 | 90 days supply | Qty: 90 | Fill #0

## 2019-03-30 MED FILL — ALLOPURINOL 100 MG TABS: 100 | 90 days supply | Qty: 90 | Fill #0

## 2019-03-30 MED FILL — FARXIGA 5 MG TABLET: 5 | 30 days supply | Qty: 30 | Fill #0

## 2019-03-30 MED FILL — AMLODIPINE BESYLATE 5 MG TA: 5 | 90 days supply | Qty: 90 | Fill #0

## 2019-04-03 MED FILL — FINASTERIDE 1 MG TABLET: 1 | 90 days supply | Qty: 90 | Fill #3

## 2019-04-09 DIAGNOSIS — M109 Gout, unspecified: Secondary | ICD-10-CM | POA: Diagnosis not present

## 2019-04-09 DIAGNOSIS — E782 Mixed hyperlipidemia: Secondary | ICD-10-CM | POA: Diagnosis not present

## 2019-04-09 DIAGNOSIS — Z125 Encounter for screening for malignant neoplasm of prostate: Secondary | ICD-10-CM | POA: Diagnosis not present

## 2019-04-09 DIAGNOSIS — E1169 Type 2 diabetes mellitus with other specified complication: Secondary | ICD-10-CM | POA: Diagnosis not present

## 2019-04-09 DIAGNOSIS — Z0000 Encounter for general adult medical examination without abnormal findings: Secondary | ICD-10-CM | POA: Diagnosis not present

## 2019-05-10 DIAGNOSIS — M109 Gout, unspecified: Secondary | ICD-10-CM | POA: Diagnosis not present

## 2019-05-10 MED FILL — INDOMETHACIN 50 MG CAPSULE: 50 | 10 days supply | Qty: 30 | Fill #0

## 2019-05-10 MED FILL — HYDROCODON-APAP 5-325: 5-325 | 5 days supply | Qty: 20 | Fill #0

## 2019-05-10 MED FILL — predniSONE 10 MG TABS: 10 | 12 days supply | Qty: 28 | Fill #0

## 2019-06-04 MED FILL — FARXIGA 5 MG TABLET: 5 | 30 days supply | Qty: 30 | Fill #0

## 2019-07-05 MED FILL — FARXIGA 5 MG TABLET: 5 | 30 days supply | Qty: 30 | Fill #1

## 2019-07-27 MED FILL — METFORMIN HCL ER 750 MG TB2: 750 | 30 days supply | Qty: 60 | Fill #0

## 2019-07-27 MED FILL — ATORVASTATIN 20 MG TABLET: 20 | 90 days supply | Qty: 90 | Fill #0

## 2019-07-27 MED FILL — LOSARTAN POTASSIUM 50 MG TA: 50 | 90 days supply | Qty: 90 | Fill #0

## 2019-07-30 MED FILL — FINASTERIDE 1 MG TABLET: 1 | 90 days supply | Qty: 90 | Fill #0

## 2019-07-30 MED FILL — SERTRALINE HCL 100 MG TAB: 100 | 90 days supply | Qty: 90 | Fill #0

## 2019-07-31 DIAGNOSIS — H5213 Myopia, bilateral: Secondary | ICD-10-CM | POA: Diagnosis not present

## 2019-07-31 DIAGNOSIS — H524 Presbyopia: Secondary | ICD-10-CM | POA: Diagnosis not present

## 2019-09-04 ENCOUNTER — Other Ambulatory Visit: Payer: Self-pay | Admitting: *Deleted

## 2019-09-04 DIAGNOSIS — Z20822 Contact with and (suspected) exposure to covid-19: Secondary | ICD-10-CM

## 2019-09-06 LAB — NOVEL CORONAVIRUS, NAA: SARS-CoV-2, NAA: DETECTED — AB

## 2019-09-07 MED FILL — FARXIGA 5 MG TABLET: 5 | 30 days supply | Qty: 30 | Fill #2

## 2019-10-17 MED FILL — METFORMIN HCL ER 750 MG TB2: 750 | 30 days supply | Qty: 60 | Fill #1

## 2019-10-17 MED FILL — AMLODIPINE BESYLATE 5 MG TA: 5 | 90 days supply | Qty: 90 | Fill #1

## 2019-11-12 MED FILL — FARXIGA 5 MG TABLET: 5 | 30 days supply | Qty: 30 | Fill #3

## 2019-12-10 DIAGNOSIS — D225 Melanocytic nevi of trunk: Secondary | ICD-10-CM | POA: Diagnosis not present

## 2019-12-10 DIAGNOSIS — C44719 Basal cell carcinoma of skin of left lower limb, including hip: Secondary | ICD-10-CM | POA: Diagnosis not present

## 2019-12-10 DIAGNOSIS — D2271 Melanocytic nevi of right lower limb, including hip: Secondary | ICD-10-CM | POA: Diagnosis not present

## 2019-12-10 DIAGNOSIS — C44519 Basal cell carcinoma of skin of other part of trunk: Secondary | ICD-10-CM | POA: Diagnosis not present

## 2019-12-10 DIAGNOSIS — Z85828 Personal history of other malignant neoplasm of skin: Secondary | ICD-10-CM | POA: Diagnosis not present

## 2019-12-10 DIAGNOSIS — L814 Other melanin hyperpigmentation: Secondary | ICD-10-CM | POA: Diagnosis not present

## 2019-12-10 DIAGNOSIS — D2371 Other benign neoplasm of skin of right lower limb, including hip: Secondary | ICD-10-CM | POA: Diagnosis not present

## 2019-12-10 DIAGNOSIS — L821 Other seborrheic keratosis: Secondary | ICD-10-CM | POA: Diagnosis not present

## 2019-12-10 DIAGNOSIS — D2261 Melanocytic nevi of right upper limb, including shoulder: Secondary | ICD-10-CM | POA: Diagnosis not present

## 2019-12-10 DIAGNOSIS — D2272 Melanocytic nevi of left lower limb, including hip: Secondary | ICD-10-CM | POA: Diagnosis not present

## 2019-12-10 DIAGNOSIS — D2262 Melanocytic nevi of left upper limb, including shoulder: Secondary | ICD-10-CM | POA: Diagnosis not present

## 2019-12-10 MED FILL — FARXIGA 5 MG TABLET: 5 | 30 days supply | Qty: 30 | Fill #4

## 2019-12-10 MED FILL — ALLOPURINOL 100 MG TABS: 100 | 90 days supply | Qty: 90 | Fill #0

## 2019-12-10 MED FILL — METFORMIN HCL ER 750 MG TB2: 750 | 30 days supply | Qty: 60 | Fill #2

## 2019-12-28 MED FILL — LOSARTAN POTASSIUM 50 MG TA: 50 | 90 days supply | Qty: 90 | Fill #0

## 2019-12-28 MED FILL — SERTRALINE HCL 100 MG TAB: 100 | 90 days supply | Qty: 90 | Fill #0

## 2019-12-28 MED FILL — ATORVASTATIN 20 MG TABLET: 20 | 90 days supply | Qty: 90 | Fill #0

## 2019-12-28 MED FILL — FINASTERIDE 1 MG TABS: 1 | 90 days supply | Qty: 90 | Fill #0

## 2019-12-31 DIAGNOSIS — E782 Mixed hyperlipidemia: Secondary | ICD-10-CM | POA: Diagnosis not present

## 2019-12-31 DIAGNOSIS — M10072 Idiopathic gout, left ankle and foot: Secondary | ICD-10-CM | POA: Diagnosis not present

## 2019-12-31 DIAGNOSIS — R946 Abnormal results of thyroid function studies: Secondary | ICD-10-CM | POA: Diagnosis not present

## 2019-12-31 DIAGNOSIS — Z7984 Long term (current) use of oral hypoglycemic drugs: Secondary | ICD-10-CM | POA: Diagnosis not present

## 2019-12-31 DIAGNOSIS — E1169 Type 2 diabetes mellitus with other specified complication: Secondary | ICD-10-CM | POA: Diagnosis not present

## 2019-12-31 DIAGNOSIS — I10 Essential (primary) hypertension: Secondary | ICD-10-CM | POA: Diagnosis not present

## 2019-12-31 DIAGNOSIS — F331 Major depressive disorder, recurrent, moderate: Secondary | ICD-10-CM | POA: Diagnosis not present

## 2019-12-31 MED FILL — LOSARTAN POTASSIUM 100 MG T: 100 | 90 days supply | Qty: 90 | Fill #0

## 2019-12-31 MED FILL — COLCHICINE 0.6 MG TABS: 0.6 | 45 days supply | Qty: 90 | Fill #0

## 2019-12-31 MED FILL — INDOMETHACIN 50 MG CAPSULE: 50 | 10 days supply | Qty: 30 | Fill #0

## 2019-12-31 MED FILL — predniSONE 10 MG TABS: 10 | 12 days supply | Qty: 28 | Fill #0

## 2020-01-01 MED FILL — FARXIGA 10 MG TABLET: 10 | 90 days supply | Qty: 90 | Fill #0

## 2020-01-15 MED FILL — METFORMIN HCL ER 750 MG TB2: 750 | 30 days supply | Qty: 60 | Fill #3

## 2020-02-28 DIAGNOSIS — I10 Essential (primary) hypertension: Secondary | ICD-10-CM | POA: Diagnosis not present

## 2020-02-28 DIAGNOSIS — E1169 Type 2 diabetes mellitus with other specified complication: Secondary | ICD-10-CM | POA: Diagnosis not present

## 2020-02-28 DIAGNOSIS — R946 Abnormal results of thyroid function studies: Secondary | ICD-10-CM | POA: Diagnosis not present

## 2020-02-28 MED FILL — METFORMIN HCL ER 750 MG TAB: 750 | 30 days supply | Qty: 60 | Fill #0

## 2020-02-28 MED FILL — AMLODIPINE BESYLATE 5 MG TA: 5 | 90 days supply | Qty: 90 | Fill #0

## 2020-02-28 MED FILL — INDOMETHACIN 50 MG CAPSULE: 50 | 10 days supply | Qty: 30 | Fill #0

## 2020-03-18 ENCOUNTER — Other Ambulatory Visit (HOSPITAL_COMMUNITY): Payer: Self-pay | Admitting: Family Medicine

## 2020-03-18 DIAGNOSIS — E1169 Type 2 diabetes mellitus with other specified complication: Secondary | ICD-10-CM | POA: Diagnosis not present

## 2020-03-18 DIAGNOSIS — Z7984 Long term (current) use of oral hypoglycemic drugs: Secondary | ICD-10-CM | POA: Diagnosis not present

## 2020-03-18 DIAGNOSIS — I10 Essential (primary) hypertension: Secondary | ICD-10-CM | POA: Diagnosis not present

## 2020-03-18 DIAGNOSIS — M457 Ankylosing spondylitis of lumbosacral region: Secondary | ICD-10-CM | POA: Diagnosis not present

## 2020-03-18 DIAGNOSIS — E782 Mixed hyperlipidemia: Secondary | ICD-10-CM | POA: Diagnosis not present

## 2020-03-18 DIAGNOSIS — F331 Major depressive disorder, recurrent, moderate: Secondary | ICD-10-CM | POA: Diagnosis not present

## 2020-03-18 MED FILL — AMLODIPINE BESYLATE 10 MG T: 10 | 90 days supply | Qty: 90 | Fill #0

## 2020-03-18 MED FILL — glipiZIDE XL 5 MG TB24: 5 | 90 days supply | Qty: 90 | Fill #0

## 2020-03-18 MED FILL — buPROPion HCL ER (XL) 150 M: 150 | 30 days supply | Qty: 30 | Fill #0

## 2020-05-26 MED FILL — buPROPion HCL ER (XL) 150 M: 150 | 30 days supply | Qty: 30 | Fill #0

## 2020-05-26 MED FILL — ALLOPURINOL 100 MG TABS: 100 | 90 days supply | Qty: 90 | Fill #0

## 2020-05-26 MED FILL — FREESTYLE LITE TEST STRIP: 90 days supply | Qty: 100 | Fill #0

## 2020-05-26 MED FILL — FINASTERIDE 1 MG TABLET: 1 | 90 days supply | Qty: 90 | Fill #0

## 2020-05-26 MED FILL — SERTRALINE HCL 100 MG TAB: 100 | 90 days supply | Qty: 135 | Fill #0

## 2020-05-26 MED FILL — LOSARTAN POTASSIUM 100 MG T: 100 | 90 days supply | Qty: 90 | Fill #0

## 2020-05-26 MED FILL — INDOMETHACIN 50 MG CAPSULE: 50 | 10 days supply | Qty: 30 | Fill #0

## 2020-05-26 MED FILL — ATORVASTATIN 20 MG TABLET: 20 | 90 days supply | Qty: 90 | Fill #0

## 2020-05-26 MED FILL — FARXIGA 10 MG TABLET: 10 | 90 days supply | Qty: 90 | Fill #0

## 2020-06-02 MED FILL — AMLODIPINE BESYLATE 10 MG T: 10 | 90 days supply | Qty: 90 | Fill #0

## 2020-06-02 MED FILL — glipiZIDE XL 5 MG TB24: 5 | 90 days supply | Qty: 90 | Fill #0

## 2020-06-09 DIAGNOSIS — Z6827 Body mass index (BMI) 27.0-27.9, adult: Secondary | ICD-10-CM | POA: Diagnosis not present

## 2020-06-09 DIAGNOSIS — M2550 Pain in unspecified joint: Secondary | ICD-10-CM | POA: Diagnosis not present

## 2020-06-09 DIAGNOSIS — M1009 Idiopathic gout, multiple sites: Secondary | ICD-10-CM | POA: Diagnosis not present

## 2020-06-09 DIAGNOSIS — E663 Overweight: Secondary | ICD-10-CM | POA: Diagnosis not present

## 2020-06-09 DIAGNOSIS — M459 Ankylosing spondylitis of unspecified sites in spine: Secondary | ICD-10-CM | POA: Diagnosis not present

## 2020-06-09 DIAGNOSIS — M545 Low back pain: Secondary | ICD-10-CM | POA: Diagnosis not present

## 2020-06-09 DIAGNOSIS — M150 Primary generalized (osteo)arthritis: Secondary | ICD-10-CM | POA: Diagnosis not present

## 2020-07-09 ENCOUNTER — Other Ambulatory Visit (HOSPITAL_COMMUNITY): Payer: Self-pay | Admitting: Rheumatology

## 2020-07-09 DIAGNOSIS — M459 Ankylosing spondylitis of unspecified sites in spine: Secondary | ICD-10-CM | POA: Diagnosis not present

## 2020-07-09 DIAGNOSIS — M1009 Idiopathic gout, multiple sites: Secondary | ICD-10-CM | POA: Diagnosis not present

## 2020-07-09 DIAGNOSIS — M150 Primary generalized (osteo)arthritis: Secondary | ICD-10-CM | POA: Diagnosis not present

## 2020-07-09 DIAGNOSIS — E663 Overweight: Secondary | ICD-10-CM | POA: Diagnosis not present

## 2020-07-09 DIAGNOSIS — Z6825 Body mass index (BMI) 25.0-25.9, adult: Secondary | ICD-10-CM | POA: Diagnosis not present

## 2020-07-09 DIAGNOSIS — E1165 Type 2 diabetes mellitus with hyperglycemia: Secondary | ICD-10-CM | POA: Diagnosis not present

## 2020-07-09 DIAGNOSIS — M2550 Pain in unspecified joint: Secondary | ICD-10-CM | POA: Diagnosis not present

## 2020-09-25 MED FILL — buPROPion HCL ER (XL) 150 M: 150 | 30 days supply | Qty: 30 | Fill #1

## 2020-09-26 ENCOUNTER — Other Ambulatory Visit (HOSPITAL_COMMUNITY): Payer: Self-pay | Admitting: Family Medicine

## 2020-09-26 MED FILL — ALLOPURINOL 100 MG TABS: 100 | 30 days supply | Qty: 30 | Fill #0

## 2020-09-26 MED FILL — ATORVASTATIN CALCIUM 20 MG: 20 | 30 days supply | Qty: 30 | Fill #0

## 2020-09-26 MED FILL — FINASTERIDE 1 MG TABLET: 1 | 30 days supply | Qty: 30 | Fill #0

## 2020-09-29 ENCOUNTER — Other Ambulatory Visit: Payer: Self-pay

## 2020-09-29 ENCOUNTER — Other Ambulatory Visit: Payer: Self-pay | Admitting: Nurse Practitioner

## 2020-09-29 ENCOUNTER — Ambulatory Visit (INDEPENDENT_AMBULATORY_CARE_PROVIDER_SITE_OTHER): Payer: Self-pay | Admitting: *Deleted

## 2020-09-29 DIAGNOSIS — Z1211 Encounter for screening for malignant neoplasm of colon: Secondary | ICD-10-CM

## 2020-09-29 MED ORDER — CLENPIQ 10-3.5-12 MG-GM -GM/160ML PO SOLN: 1.0000 | Freq: Once | ORAL | 0 refills | Status: DC

## 2020-09-29 MED FILL — CLENPIQ 10-3.5-12 MG-GM -GM: 10-3.5-12 M | 2 days supply | Qty: 320 | Fill #0

## 2020-09-29 NOTE — Progress Notes (Addendum)
Gastroenterology Pre-Procedure Review  Request Date: 09/29/2020 Requesting Physician: Last TCS done at age 64 in [place], diverticula per pt, no record of procedure in Epic, triage per [place]: The patient responded to the following health history questions as indicated:    1. Diabetes Melitis: yes, type II 2. Joint replacements in the past 12 months: no 3. Major health problems in the past 3 months: no 4. Has an artificial valve or MVP: no 5. Has a defibrillator: no 6. Has been advised in past to take antibiotics in advance of a procedure like teeth cleaning: no 7. Family history of colon cancer: no  8. Alcohol Use: yes, 2 beers a week 9. Illicit drug Use: no 10. History of sleep apnea: yes, but not on CPAP  11. History of coronary artery or other vascular stents placed within the last 12 months: no 12. History of any prior anesthesia complications: no 13. There is no height or weight on file to calculate BMI. ht: 6'0 wt: 180 lbs    MEDICATIONS & ALLERGIES:    Patient reports the following regarding taking any blood thinners:   Plavix? no Aspirin? no Coumadin? no Brilinta? no Xarelto? no Eliquis? no Pradaxa? no Savaysa? no Effient? no  Patient confirms/reports the following medications:  Current Outpatient Medications  Medication Sig Dispense Refill  . allopurinol (ZYLOPRIM) 100 MG tablet Take 1 tablet (100 mg total) by mouth 2 (two) times daily. 180 tablet 1  . amLODipine (NORVASC) 10 MG tablet Take 1 tablet (10 mg total) by mouth daily. 90 tablet 3  . atorvastatin (LIPITOR) 20 MG tablet Take 1 tablet (20 mg total) by mouth daily. 90 tablet 3  . colchicine 0.6 MG tablet Take 0.6 mg by mouth as directed.    Marland Kitchen FARXIGA 10 MG TABS tablet Take 10 mg by mouth daily.    . finasteride (PROPECIA) 1 MG tablet Take 1 tablet (1 mg total) by mouth daily. 90 tablet 3  . GLIPIZIDE XL 5 MG 24 hr tablet Take 5 mg by mouth daily.    . indomethacin (INDOCIN) 50  MG capsule Take 50 mg by mouth as needed.    Marland Kitchen losartan (COZAAR) 50 MG tablet Take 1 tablet (50 mg total) by mouth daily. (Patient taking differently: Take 100 mg by mouth daily. ) 90 tablet 3  . sertraline (ZOLOFT) 50 MG tablet Take 1 tablet (50 mg total) by mouth daily. (Patient taking differently: Take 100 mg by mouth daily. ) 90 tablet 1   No current facility-administered medications for this visit.    Patient confirms/reports the following allergies:  No Known Allergies  No orders of the defined types were placed in this encounter.   AUTHORIZATION INFORMATION Primary Insurance: [employer] [place],  [place] #[id number]: ,  Group #: [id number] Pre-Cert / Josem Kaufmann required: No, not required per Autumn Messing / Auth #: Ref#: [id number]  SCHEDULE INFORMATION: Procedure has been scheduled as follows:  Date: 10/06/2020, Time: 2:30 Location: [facility] with Dr. Abbey Chatters  This Gastroenterology Pre-Precedure Review Form is being routed to the following provider(s): Walden Field, NP

## 2020-09-29 NOTE — Patient Instructions (Signed)
[facility] INSTRUCTIONS   Patient Name:  Craig Wagner Date of procedure: 10/06/2020 Time to register at [place] Stay:  1:00 pm Provider:  Dr. Abbey Chatters   Please notify us immediately if you are diabetic, take iron supplements, or if you are on coumadin or any blood thinners.  Please hold the following medications: See letter  Note: Do NOT refrigerate or freeze CLENPIQ. CLENPIQ is ready to drink. There is no need to add any other liquid or mix the medicine in the bottle before you start dosing.   10/05/2020- 1 Day prior to procedure:     CLEAR LIQUIDS ALL DAY--NO SOLID FOODS OR DAIRY PRODUCTS! See list of liquids that are allowed and items that are NOT allowed below.    Diabetic Medication Instructions:  See letter   You must drink plenty of CLEAR LIQUIDS starting before your bowel prep. It is important to stay adequately hydrated before, during, and after your bowel prep for the prep to work effectively!    At 5:00 PM Begin the prep as follows:    1. Drink one bottle of premixed CLENPIQ right from the bottle. 2. Drink at least five (5) 8-ounce drinks of clear liquids of your choice within the next 5 hours   Continue clear liquids.    10/06/2020- Day of Procedure   Diabetic Medication Instructions: See letter   You may take TYLENOL products. Please continue your regular medications unless we have instructed you otherwise.    5 hours before procedure @ 9:30 am:  1. Drink second bottle of premixed CLENPIQ right from the bottle.   2. Drink at least three (3) 8-ounce drinks of clear liquids of your choice within the next 2 hours. You can drink more if needed.   3 hours before your procedure time @ 11:30 am: Stop drinking all liquids, nothing by mouth at this point.  Please note, on the day of your procedure you MUST be accompanied by an adult who is willing to assume responsibility for you at time of discharge. If you do not have such person with you, your  procedure will have to be rescheduled.                                                                                                                     Please leave ALL jewelry at home prior to coming to the hospital for your procedure.   *It is your responsibility to check with your insurance company for the benefits of coverage you have for this procedure. Unfortunately, not all insurance companies have benefits to cover all or part of these types of procedures. It is your responsibility to check your benefits, however we will be glad to assist you with any codes your insurance company may need.   Please note that most insurance companies will not cover a screening colonoscopy for people under the age of 36  For example, with some insurance companies you may have benefits for a screening colonoscopy, but if  polyps are found the diagnosis will change and then you may have a deductible that will need to be met. Please make sure you check your benefits for screening colonoscopy as well as a diagnostic colonoscopy.   CLEAR LIQUIDS: (NO RED or PURPLE) Water  Jello   Apple Juice  White Grape Juice   Kool-Aid Soft drinks  Banana popsicles Sports Drink  Black coffee (No cream or milk) Tea (No cream or milk)  Broth (fat free beef/chicken/vegetable)  Clear liquids allow you to see your fingers on the other side of the glass.  Be sure they are NOT RED or PURPLE in color, cloudy, but CLEAR.  Do Not Eat: Dairy products of any kind Cranberry juice Tomato or V8 Juice  Orange Juice   Grapefruit Juice Red Grape Juice Alcohol   Non-dairy creamer Solid foods like cereal, oatmeal, yogurt, fruits, vegetables, creamed soups, eggs, bread, etc   HELPFUL HINTS TO MAKE DRINKING EASIER: -Trying drinking through a straw. -If you become nauseated, try consuming smaller amounts or stretch out the time between glasses.  Stop for 30 minutes & slowly start back drinking.  Call our office with any questions or  concerns at [phone].  Thank You,  Christ Kick, [place]

## 2020-09-30 ENCOUNTER — Encounter: Payer: Self-pay | Admitting: *Deleted

## 2020-09-30 NOTE — Progress Notes (Signed)
Ok to schedule.  DM meds:  Farxiga: none morning of. Glipizide: none morning of  On prep day: Check CBG ac and hs as well (if they normally check their blood sugar) as if the patient feels like their blood sugar is off. Can use soda, juice (that's in [place]) as needed for any low blood sugar.  Check CBG on arrival to endo unit.

## 2020-10-01 ENCOUNTER — Telehealth: Payer: Self-pay | Admitting: *Deleted

## 2020-10-01 ENCOUNTER — Other Ambulatory Visit: Payer: Self-pay | Admitting: *Deleted

## 2020-10-01 NOTE — Telephone Encounter (Signed)
E-mailed instructions to pt at [email or web address].  Lmom yesterday evening for pt to call me back this morning to make sure he received them.

## 2020-10-01 NOTE — Telephone Encounter (Signed)
Spoke to pt and he confirmed that he received instructions for procedure by e-mail.

## 2020-10-03 ENCOUNTER — Other Ambulatory Visit (HOSPITAL_COMMUNITY)
Admission: RE | Admit: 2020-10-03 | Discharge: 2020-10-03 | Disposition: A | Discharge status: Home / Self Care | Payer: 59 | Source: Ambulatory Visit | Attending: Internal Medicine | Admitting: Internal Medicine

## 2020-10-06 ENCOUNTER — Encounter (HOSPITAL_COMMUNITY): Payer: Self-pay

## 2020-10-06 ENCOUNTER — Other Ambulatory Visit: Payer: Self-pay

## 2020-10-06 ENCOUNTER — Encounter (HOSPITAL_COMMUNITY)
Admission: RE | Disposition: A | Discharge status: Home / Self Care | Payer: Self-pay | Source: Home / Self Care | Attending: Internal Medicine

## 2020-10-06 ENCOUNTER — Ambulatory Visit (HOSPITAL_COMMUNITY)
Admission: RE | Admit: 2020-10-06 | Discharge: 2020-10-06 | Disposition: A | Discharge status: Home / Self Care | Payer: 59 | Attending: Internal Medicine | Admitting: Internal Medicine

## 2020-10-06 ENCOUNTER — Ambulatory Visit (HOSPITAL_COMMUNITY): Payer: 59 | Admitting: Anesthesiology

## 2020-10-06 DIAGNOSIS — Z7984 Long term (current) use of oral hypoglycemic drugs: Secondary | ICD-10-CM | POA: Diagnosis not present

## 2020-10-06 DIAGNOSIS — K648 Other hemorrhoids: Secondary | ICD-10-CM | POA: Diagnosis not present

## 2020-10-06 DIAGNOSIS — K5989 Other specified functional intestinal disorders: Secondary | ICD-10-CM | POA: Diagnosis not present

## 2020-10-06 DIAGNOSIS — Z20822 Contact with and (suspected) exposure to covid-19: Secondary | ICD-10-CM | POA: Diagnosis not present

## 2020-10-06 DIAGNOSIS — Z1211 Encounter for screening for malignant neoplasm of colon: Secondary | ICD-10-CM | POA: Insufficient documentation

## 2020-10-06 DIAGNOSIS — K5730 Diverticulosis of large intestine without perforation or abscess without bleeding: Secondary | ICD-10-CM | POA: Diagnosis not present

## 2020-10-06 DIAGNOSIS — Z79899 Other long term (current) drug therapy: Secondary | ICD-10-CM | POA: Diagnosis not present

## 2020-10-06 HISTORY — DX: Sleep apnea, unspecified: G47.30

## 2020-10-06 HISTORY — DX: Unspecified osteoarthritis, unspecified site: M19.90

## 2020-10-06 HISTORY — DX: Type 2 diabetes mellitus without complications: E11.9

## 2020-10-06 HISTORY — PX: BIOPSY: SHX5522

## 2020-10-06 HISTORY — PX: COLONOSCOPY WITH PROPOFOL: SHX5780

## 2020-10-06 LAB — GLUCOSE, CAPILLARY: Glucose-Capillary: 204 mg/dL — ABNORMAL HIGH (ref 70–99)

## 2020-10-06 LAB — SARS CORONAVIRUS 2 BY RT PCR (HOSPITAL ORDER, PERFORMED IN ~~LOC~~ HOSPITAL LAB): SARS Coronavirus 2: NEGATIVE

## 2020-10-06 SURGERY — COLONOSCOPY WITH PROPOFOL
Anesthesia: General

## 2020-10-06 MED ORDER — PROPOFOL 500 MG/50ML IV EMUL
INTRAVENOUS | Status: DC | PRN
  Administered 2020-10-06 (×2): 125 ug/kg/min via INTRAVENOUS

## 2020-10-06 MED ORDER — LIDOCAINE HCL (CARDIAC) PF 50 MG/5ML IV SOSY
PREFILLED_SYRINGE | INTRAVENOUS | Status: DC | PRN
  Administered 2020-10-06: 60 mg via INTRAVENOUS

## 2020-10-06 MED ORDER — PROPOFOL 10 MG/ML IV BOLUS
INTRAVENOUS | Status: DC | PRN
  Administered 2020-10-06: 100 mg via INTRAVENOUS
  Administered 2020-10-06: 20 mg via INTRAVENOUS
  Administered 2020-10-06: 60 mg via INTRAVENOUS
  Administered 2020-10-06: 20 mg via INTRAVENOUS

## 2020-10-06 MED ORDER — LACTATED RINGERS IV SOLN: INTRAVENOUS | Status: DC

## 2020-10-06 NOTE — Anesthesia Procedure Notes (Signed)
Date/Time: 10/06/2020 2:15 PM Performed by: Vista Deck, CRNA Pre-anesthesia Checklist: Patient identified, Emergency Drugs available, Suction available, Timeout performed and Patient being monitored Patient Re-evaluated:Patient Re-evaluated prior to induction Oxygen Delivery Method: Nasal Cannula

## 2020-10-06 NOTE — Discharge Instructions (Addendum)
Colonoscopy Discharge Instructions  Read the instructions outlined below and refer to this sheet in the next few weeks. These discharge instructions provide you with general information on caring for yourself after you leave the hospital. Your doctor may also give you specific instructions. While your treatment has been planned according to the most current medical practices available, unavoidable complications occasionally occur.   ACTIVITY  You may resume your regular activity, but move at a slower pace for the next 24 hours.   Take frequent rest periods for the next 24 hours.   Walking will help get rid of the air and reduce the bloated feeling in your belly (abdomen).   No driving for 24 hours (because of the medicine (anesthesia) used during the test).    Do not sign any important legal documents or operate any machinery for 24 hours (because of the anesthesia used during the test).  NUTRITION  Drink plenty of fluids.   You may resume your normal diet as instructed by your doctor.   Begin with a light meal and progress to your normal diet. Heavy or fried foods are harder to digest and may make you feel sick to your stomach (nauseated).   Avoid alcoholic beverages for 24 hours or as instructed.  MEDICATIONS  You may resume your normal medications unless your doctor tells you otherwise.  WHAT YOU CAN EXPECT TODAY  Some feelings of bloating in the abdomen.   Passage of more gas than usual.   Spotting of blood in your stool or on the toilet paper.  IF YOU HAD POLYPS REMOVED DURING THE COLONOSCOPY:  No aspirin products for 7 days or as instructed.   No alcohol for 7 days or as instructed.   Eat a soft diet for the next 24 hours.  FINDING OUT THE RESULTS OF YOUR TEST Not all test results are available during your visit. If your test results are not back during the visit, make an appointment with your caregiver to find out the results. Do not assume everything is normal if  you have not heard from your caregiver or the medical facility. It is important for you to follow up on all of your test results.  SEEK IMMEDIATE MEDICAL ATTENTION IF:  You have more than a spotting of blood in your stool.   Your belly is swollen (abdominal distention).   You are nauseated or vomiting.   You have a temperature over 101.   You have abdominal pain or discomfort that is severe or gets worse throughout the day.   Your colonoscopy was relatively unremarkable.  I did not find any polyps or evidence of colon cancer.  You had a small amount of inflammation in the distal portion of your colon.  This was likely due to the colon preparation itself.  I did take biopsies of this however and we should have these results back by the end of the week.  My office will contact you.  Otherwise we will plan on repeat colonoscopy in 10 years for screening purposes.  You also have diverticulosis and internal hemorrhoids. I would recommend increasing fiber in your diet or adding OTC Benefiber/Metamucil. Be sure to drink at least 4 to 6 glasses of water daily. Follow-up with GI as needed.   I hope you have a great rest of your week!  Elon Alas. Abbey Chatters, D.O. Gastroenterology and Hepatology [facility]   Diverticulosis  Diverticulosis is a condition that develops when small pouches (diverticula) form in the wall of  the large intestine (colon). The colon is where water is absorbed and stool (feces) is formed. The pouches form when the inside layer of the colon pushes through weak spots in the outer layers of the colon. You may have a few pouches or many of them. The pouches usually do not cause problems unless they become inflamed or infected. When this happens, the condition is called diverticulitis. What are the causes? The cause of this condition is not known. What increases the risk? The following factors may make you more likely to develop this condition:  Being  older than age 71. Your risk for this condition increases with age. Diverticulosis is rare among people younger than age 14. By age 19, many people have it.  Eating a low-fiber diet.  Having frequent constipation.  Being overweight.  Not getting enough exercise.  Smoking.  Taking over-the-counter pain medicines, like aspirin and ibuprofen.  Having a family history of diverticulosis. What are the signs or symptoms? In most people, there are no symptoms of this condition. If you do have symptoms, they may include:  Bloating.  Cramps in the abdomen.  Constipation or diarrhea.  Pain in the lower left side of the abdomen. How is this diagnosed? Because diverticulosis usually has no symptoms, it is most often diagnosed during an exam for other colon problems. The condition may be diagnosed by:  Using a flexible scope to examine the colon (colonoscopy).  Taking an X-ray of the colon after dye has been put into the colon (barium enema).  Having a CT scan. How is this treated? You may not need treatment for this condition. Your health care provider may recommend treatment to prevent problems. You may need treatment if you have symptoms or if you previously had diverticulitis. Treatment may include:  Eating a high-fiber diet.  Taking a fiber supplement.  Taking a live bacteria supplement (probiotic).  Taking medicine to relax your colon. Follow these instructions at home: Medicines  Take over-the-counter and prescription medicines only as told by your health care provider.  If told by your health care provider, take a fiber supplement or probiotic. Constipation prevention Your condition may cause constipation. To prevent or treat constipation, you may need to:  Drink enough fluid to keep your urine pale yellow.  Take over-the-counter or prescription medicines.  Eat foods that are high in fiber, such as beans, whole grains, and fresh fruits and vegetables.  Limit foods  that are high in fat and processed sugars, such as fried or sweet foods.  General instructions  Try not to strain when you have a bowel movement.  Keep all follow-up visits as told by your health care provider. This is important. Contact a health care provider if you:  Have pain in your abdomen.  Have bloating.  Have cramps.  Have not had a bowel movement in 3 days. Get help right away if:  Your pain gets worse.  Your bloating becomes very bad.  You have a fever or chills, and your symptoms suddenly get worse.  You vomit.  You have bowel movements that are bloody or black.  You have bleeding from your rectum. Summary  Diverticulosis is a condition that develops when small pouches (diverticula) form in the wall of the large intestine (colon).  You may have a few pouches or many of them.  This condition is most often diagnosed during an exam for other colon problems.  Treatment may include increasing the fiber in your diet, taking supplements, or taking medicines.  This information is not intended to replace advice given to you by your health care provider. Make sure you discuss any questions you have with your health care provider. Document Revised: 05/10/2019 Document Reviewed: 05/10/2019 Elsevier Patient Education  [place].    Hemorrhoids Hemorrhoids are swollen veins in and around the rectum or anus. There are two types of hemorrhoids:  Internal hemorrhoids. These occur in the veins that are just inside the rectum. They may poke through to the outside and become irritated and painful.  External hemorrhoids. These occur in the veins that are outside the anus and can be felt as a painful swelling or hard lump near the anus. Most hemorrhoids do not cause serious problems, and they can be managed with home treatments such as diet and lifestyle changes. If home treatments do not help the symptoms, procedures can be done to shrink or remove the  hemorrhoids. What are the causes? This condition is caused by increased pressure in the anal area. This pressure may result from various things, including:  Constipation.  Straining to have a bowel movement.  Diarrhea.  Pregnancy.  Obesity.  Sitting for long periods of time.  Heavy lifting or other activity that causes you to strain.  Anal sex.  Riding a bike for a long period of time. What are the signs or symptoms? Symptoms of this condition include:  Pain.  Anal itching or irritation.  Rectal bleeding.  Leakage of stool (feces).  Anal swelling.  One or more lumps around the anus. How is this diagnosed? This condition can often be diagnosed through a visual exam. Other exams or tests may also be done, such as:  An exam that involves feeling the rectal area with a gloved hand (digital rectal exam).  An exam of the anal canal that is done using a small tube (anoscope).  A blood test, if you have lost a significant amount of blood.  A test to look inside the colon using a flexible tube with a camera on the end (sigmoidoscopy or colonoscopy). How is this treated? This condition can usually be treated at home. However, various procedures may be done if dietary changes, lifestyle changes, and other home treatments do not help your symptoms. These procedures can help make the hemorrhoids smaller or remove them completely. Some of these procedures involve surgery, and others do not. Common procedures include:  Rubber band ligation. Rubber bands are placed at the base of the hemorrhoids to cut off their blood supply.  Sclerotherapy. Medicine is injected into the hemorrhoids to shrink them.  Infrared coagulation. A type of light energy is used to get rid of the hemorrhoids.  Hemorrhoidectomy surgery. The hemorrhoids are surgically removed, and the veins that supply them are tied off.  Stapled hemorrhoidopexy surgery. The surgeon staples the base of the hemorrhoid to  the rectal wall. Follow these instructions at home: Eating and drinking   Eat foods that have a lot of fiber in them, such as whole grains, beans, nuts, fruits, and vegetables.  Ask your health care provider about taking products that have added fiber (fiber supplements).  Reduce the amount of fat in your diet. You can do this by eating low-fat dairy products, eating less red meat, and avoiding processed foods.  Drink enough fluid to keep your urine pale yellow. Managing pain and swelling   Take warm sitz baths for 20 minutes, 3-4 times a day to ease pain and discomfort. You may do this in a bathtub or using  a portable sitz bath that fits over the toilet.  If directed, apply ice to the affected area. Using ice packs between sitz baths may be helpful. ? Put ice in a plastic bag. ? Place a towel between your skin and the bag. ? Leave the ice on for 20 minutes, 2-3 times a day. General instructions  Take over-the-counter and prescription medicines only as told by your health care provider.  Use medicated creams or suppositories as told.  Get regular exercise. Ask your health care provider how much and what kind of exercise is best for you. In general, you should do moderate exercise for at least 30 minutes on most days of the week (150 minutes each week). This can include activities such as walking, biking, or yoga.  Go to the bathroom when you have the urge to have a bowel movement. Do not wait.  Avoid straining to have bowel movements.  Keep the anal area dry and clean. Use wet toilet paper or moist towelettes after a bowel movement.  Do not sit on the toilet for long periods of time. This increases blood pooling and pain.  Keep all follow-up visits as told by your health care provider. This is important. Contact a health care provider if you have:  Increasing pain and swelling that are not controlled by treatment or medicine.  Difficulty having a bowel movement, or you are  unable to have a bowel movement.  Pain or inflammation outside the area of the hemorrhoids. Get help right away if you have:  Uncontrolled bleeding from your rectum. Summary  Hemorrhoids are swollen veins in and around the rectum or anus.  Most hemorrhoids can be managed with home treatments such as diet and lifestyle changes.  Taking warm sitz baths can help ease pain and discomfort.  In severe cases, procedures or surgery can be done to shrink or remove the hemorrhoids. This information is not intended to replace advice given to you by your health care provider. Make sure you discuss any questions you have with your health care provider. Document Revised: 03/09/2019 Document Reviewed: 03/02/2018 Elsevier Patient Education  [place].

## 2020-10-06 NOTE — Anesthesia Postprocedure Evaluation (Signed)
Anesthesia Post Note  Patient: Goldie Dimmer  Procedure(s) Performed: COLONOSCOPY WITH PROPOFOL (N/A ) BIOPSY  Patient location during evaluation: Endoscopy Anesthesia Type: General Level of consciousness: awake and alert and patient cooperative Pain management: satisfactory to patient Vital Signs Assessment: post-procedure vital signs reviewed and stable Respiratory status: spontaneous breathing Cardiovascular status: stable Postop Assessment: no apparent nausea or vomiting Anesthetic complications: no   No complications documented.   Last Vitals:  Vitals:   10/06/20 1317 10/06/20 1447  BP: (!) 153/105 125/82  Pulse: 73 64  Resp: 12 12  Temp: 36.7 C 36.5 C  SpO2: 99% 98%    Last Pain:  Vitals:   10/06/20 1447  TempSrc: Oral  PainSc: 0-No pain                 Renn Dirocco

## 2020-10-06 NOTE — Transfer of Care (Signed)
Immediate Anesthesia Transfer of Care Note  Patient: Craig Wagner  Procedure(s) Performed: COLONOSCOPY WITH PROPOFOL (N/A ) BIOPSY  Patient Location: Endoscopy Unit  Anesthesia Type:General  Level of Consciousness: sedated and patient cooperative  Airway & Oxygen Therapy: Patient Spontanous Breathing  Post-op Assessment: Report given to RN and Post -op Vital signs reviewed and stable  Post vital signs: Reviewed and stable  Last Vitals:  Vitals Value Taken Time  BP    Temp    Pulse    Resp    SpO2    SEE VITAL SIGN FLOW SHEET  Last Pain:  Vitals:   10/06/20 1417  TempSrc:   PainSc: 0-No pain      Patients Stated Pain Goal: 8 (74/73/40 3709)  Complications: No complications documented.

## 2020-10-06 NOTE — Anesthesia Preprocedure Evaluation (Signed)
Anesthesia Evaluation  Patient identified by MRN, date of birth, ID band Patient awake    Reviewed: Allergy & Precautions, H&P , NPO status , Patient's Chart, lab work & pertinent test results, reviewed documented beta blocker date and time   Airway Mallampati: II  TM Distance: >3 FB Neck ROM: full    Dental no notable dental hx.    Pulmonary sleep apnea ,    Pulmonary exam normal breath sounds clear to auscultation       Cardiovascular Exercise Tolerance: Good hypertension, negative cardio ROS   Rhythm:regular Rate:Normal     Neuro/Psych PSYCHIATRIC DISORDERS Depression negative neurological ROS     GI/Hepatic negative GI ROS, Neg liver ROS,   Endo/Other  negative endocrine ROSdiabetes  Renal/GU negative Renal ROS  negative genitourinary   Musculoskeletal   Abdominal   Peds  Hematology negative hematology ROS (+)   Anesthesia Other Findings   Reproductive/Obstetrics negative OB ROS                             Anesthesia Physical Anesthesia Plan  ASA: II  Anesthesia Plan: General   Post-op Pain Management:    Induction:   PONV Risk Score and Plan: Propofol infusion  Airway Management Planned:   Additional Equipment:   Intra-op Plan:   Post-operative Plan:   Informed Consent: I have reviewed the patients History and Physical, chart, labs and discussed the procedure including the risks, benefits and alternatives for the proposed anesthesia with the patient or authorized representative who has indicated his/her understanding and acceptance.     Dental Advisory Given  Plan Discussed with: CRNA  Anesthesia Plan Comments:         Anesthesia Quick Evaluation

## 2020-10-06 NOTE — Op Note (Signed)
[facility] Patient Name: Craig Wagner Procedure Date: 10/06/2020 1:46 PM MRN: [id number] Date of Birth: 20-Jan-1956 Attending MD: Elon Alas. Abbey Chatters DO CSN: [id number] Age: 64 Admit Type: Outpatient Procedure:                Colonoscopy Indications:              Screening for colorectal malignant neoplasm Providers:                Elon Alas. Italo Banton, DO, Otis Peak B. Sharon Seller, RN,                            Caprice Kluver, Nelma Rothman, Technician Referring MD:              Medicines:                See the Anesthesia note for documentation of the                            administered medications Complications:            No immediate complications. Estimated Blood Loss:     Estimated blood loss was minimal. Procedure:                Pre-Anesthesia Assessment:                           - The anesthesia plan was to use monitored                            anesthesia care (MAC).                           After obtaining informed consent, the colonoscope                            was passed under direct vision. Throughout the                            procedure, the patient's blood pressure, pulse, and                            oxygen saturations were monitored continuously. The                            PCF-H190DL ([id number]) scope was introduced through                            the anus and advanced to the the cecum, identified                            by appendiceal orifice and ileocecal valve. The                            colonoscopy was technically difficult and complex                            due to  a redundant colon and significant looping.                            Successful completion of the procedure was aided by                            using manual pressure. The patient tolerated the                            procedure well. The quality of the bowel                            preparation was evaluated using the BBPS [facility]                            Bowel  Preparation Scale) with scores of: Right                            Colon = 3, Transverse Colon = 3 and Left Colon = 3                            (entire mucosa seen well with no residual staining,                            small fragments of stool or opaque liquid). The                            total BBPS score equals 9. Scope In: 2:21:58 PM Scope Out: 2:43:30 PM Scope Withdrawal Time: 0 hours 9 minutes 34 seconds  Total Procedure Duration: 0 hours 21 minutes 32 seconds  Findings:      The perianal and digital rectal examinations were normal.      Non-bleeding internal hemorrhoids were found during endoscopy.      Many small and large-mouthed diverticula were found in the entire colon.      Localized mild inflammation characterized by erythema was found in the       sigmoid colon. Biopsies were taken with a cold forceps for histology. Impression:               - Non-bleeding internal hemorrhoids.                           - Diverticulosis in the entire examined colon.                           - Localized mild inflammation was found in the                            sigmoid colon [Colitis (Autoimpression)]. Biopsied. Moderate Sedation:      Per Anesthesia Care Recommendation:           - Patient has a contact number available for                            emergencies. The signs and symptoms  of potential                            delayed complications were discussed with the                            patient. Return to normal activities tomorrow.                            Written discharge instructions were provided to the                            patient.                           - Resume previous diet.                           - Continue present medications.                           - Await pathology results.                           - Repeat colonoscopy in 10 years for screening                            purposes.                           - Return to GI clinic  PRN. Procedure Code(s):        --- Professional ---                           [phone], Colonoscopy, flexible; with biopsy, single                            or multiple Diagnosis Code(s):        --- Professional ---                           Z12.11, Encounter for screening for malignant                            neoplasm of colon                           K64.8, Other hemorrhoids                           K57.30, Diverticulosis of large intestine without                            perforation or abscess without bleeding CPT copyright 2019 American Medical Association. All rights reserved. The codes documented in this report are preliminary and upon coder review may  be revised to meet current compliance requirements. Elon Alas. Abbey Chatters, DO [place] Abbey Chatters, DO 10/06/2020 2:48:29 PM This report has been  signed electronically. Number of Addenda: 0

## 2020-10-06 NOTE — H&P (Signed)
Primary Care Physician:  Caren Macadam, MD Primary Gastroenterologist:  Dr. Abbey Chatters  Pre-Procedure History & Physical: HPI:  Craig Wagner is a 64 y.o. male is here for a colonoscopy for colon cancer screening purposes.  Patient denies any family history of colorectal cancer.  No melena or hematochezia.  No abdominal pain or unintentional weight loss.  No change in bowel habits.  Overall feels well from a GI standpoint.  Past Medical History:  Diagnosis Date  . Degenerative joint disease   . Diabetes mellitus without complication ([place])   . Diverticulitis   . Gout   . Hypertension   . Irritable bowel syndrome   . Sleep apnea     Past Surgical History:  Procedure Laterality Date  . compound fx tibia and fibula    . WRIST SURGERY Left     Prior to Admission medications   Medication Sig Start Date End Date Taking? Authorizing Provider  allopurinol (ZYLOPRIM) 100 MG tablet Take 1 tablet (100 mg total) by mouth 2 (two) times daily. 04/20/18   Caren Macadam, MD  amLODipine (NORVASC) 10 MG tablet Take 1 tablet (10 mg total) by mouth daily. 04/20/18   Caren Macadam, MD  atorvastatin (LIPITOR) 20 MG tablet Take 1 tablet (20 mg total) by mouth daily. 03/28/18   Caren Macadam, MD  buPROPion (WELLBUTRIN XL) 150 MG 24 hr tablet Take 150 mg by mouth every morning. 09/25/20   [provider]  CLENPIQ 10-3.5-12 MG-GM -GM/160ML SOLN Take by mouth. 09/29/20   [provider]  colchicine 0.6 MG tablet Take 0.6 mg by mouth daily as needed (gout).    [provider]  FARXIGA 10 MG TABS tablet Take 10 mg by mouth daily. 05/26/20   [provider]  finasteride (PROPECIA) 1 MG tablet Take 1 tablet (1 mg total) by mouth daily. 04/20/18   Caren Macadam, MD  GLIPIZIDE XL 5 MG 24 hr tablet Take 5 mg by mouth daily. 06/02/20   [provider]  indomethacin (INDOCIN) 50 MG capsule Take 50 mg by mouth daily as needed (gout pain.). 05/26/20   [provider]  losartan  (COZAAR) 100 MG tablet Take 100 mg by mouth daily. 05/26/20   [provider]  sertraline (ZOLOFT) 100 MG tablet Take 150 mg by mouth daily. 05/26/20   [provider]    Allergies as of 09/30/2020  . (No Known Allergies)    Family History  Problem Relation Age of Onset  . Cancer Mother   . Heart disease Father     Social History   Socioeconomic History  . Marital status: Married    Spouse name: Not on file  . Number of children: Not on file  . Years of education: Not on file  . Highest education level: Not on file  Occupational History  . Not on file  Tobacco Use  . Smoking status: Never Smoker  . Smokeless tobacco: Never Used  Vaping Use  . Vaping Use: Never used  Substance and Sexual Activity  . Alcohol use: Yes    Alcohol/week: 1.0 standard drink    Types: 1 Cans of beer per week  . Drug use: No  . Sexual activity: Not Currently    Partners: Male, Male  Other Topics Concern  . Not on file  Social History Narrative   Grew up in [place].    Moved to [place] for school.   Father was a Risk analyst in the [employer].   Works part-time.   Married for  28 years.    Does not have children.   Has a lot of pets.   Takes care of mules, dogs, pets.   Has a motorcycle.       Social Determinants of Health   Financial Resource Strain: Not on file  Food Insecurity: Not on file  Transportation Needs: Not on file  Physical Activity: Not on file  Stress: Not on file  Social Connections: Not on file  Intimate Partner Violence: Not on file    Review of Systems: See HPI, otherwise negative ROS  Impression/Plan: Craig Wagner is here for a colonoscopy to be performed for colon cancer screening purposes.  The risks of the procedure including infection, bleed, or perforation as well as benefits, limitations, alternatives and imponderables have been reviewed with the patient. Questions have been answered. All parties agreeable.

## 2020-10-08 LAB — SURGICAL PATHOLOGY

## 2020-10-10 DIAGNOSIS — H524 Presbyopia: Secondary | ICD-10-CM | POA: Diagnosis not present

## 2020-10-10 DIAGNOSIS — H52203 Unspecified astigmatism, bilateral: Secondary | ICD-10-CM | POA: Diagnosis not present

## 2020-10-10 DIAGNOSIS — H5213 Myopia, bilateral: Secondary | ICD-10-CM | POA: Diagnosis not present

## 2020-10-13 ENCOUNTER — Encounter (HOSPITAL_COMMUNITY): Payer: Self-pay | Admitting: Internal Medicine

## 2020-10-23 ENCOUNTER — Other Ambulatory Visit (HOSPITAL_COMMUNITY): Payer: Self-pay | Admitting: Family Medicine

## 2020-10-23 DIAGNOSIS — E782 Mixed hyperlipidemia: Secondary | ICD-10-CM | POA: Diagnosis not present

## 2020-10-23 DIAGNOSIS — F331 Major depressive disorder, recurrent, moderate: Secondary | ICD-10-CM | POA: Diagnosis not present

## 2020-10-23 DIAGNOSIS — E1169 Type 2 diabetes mellitus with other specified complication: Secondary | ICD-10-CM | POA: Diagnosis not present

## 2020-10-23 DIAGNOSIS — I10 Essential (primary) hypertension: Secondary | ICD-10-CM | POA: Diagnosis not present

## 2020-10-23 MED FILL — ATORVASTATIN CALCIUM 20 MG: 20 | 90 days supply | Qty: 90 | Fill #0

## 2020-10-23 MED FILL — METFORMIN HCL ER 750 MG TAB: 750 | 90 days supply | Qty: 90 | Fill #0

## 2020-10-23 MED FILL — METOPROLOL SUCCINATE ER 25: 25 | 90 days supply | Qty: 90 | Fill #0

## 2020-10-23 MED FILL — LOSARTAN POTASSIUM 100 MG T: 100 | 30 days supply | Qty: 30 | Fill #0

## 2020-10-23 MED FILL — FARXIGA 10 MG TABLET: 10 | 90 days supply | Qty: 90 | Fill #0

## 2020-10-23 MED FILL — buPROPion HCL ER (XL) 150 M: 150 | 90 days supply | Qty: 90 | Fill #0

## 2020-10-23 MED FILL — SERTRALINE HCL 100 MG TAB: 100 | 90 days supply | Qty: 135 | Fill #0

## 2020-10-23 MED FILL — glipiZIDE XL 5 MG TB24: 5 | 90 days supply | Qty: 90 | Fill #0

## 2020-10-23 MED FILL — AMLODIPINE BESYLATE 10 MG T: 10 | 90 days supply | Qty: 90 | Fill #0

## 2020-10-27 ENCOUNTER — Other Ambulatory Visit (HOSPITAL_COMMUNITY): Payer: Self-pay | Admitting: Family Medicine

## 2020-10-27 MED FILL — ALLOPURINOL 100 MG TABS: 100 | 90 days supply | Qty: 180 | Fill #0

## 2020-10-31 ENCOUNTER — Other Ambulatory Visit (HOSPITAL_COMMUNITY): Payer: Self-pay | Admitting: Family Medicine

## 2020-11-03 MED FILL — FINASTERIDE 1 MG TABLET: 1 | 90 days supply | Qty: 90 | Fill #0

## 2020-11-11 DIAGNOSIS — M2550 Pain in unspecified joint: Secondary | ICD-10-CM | POA: Diagnosis not present

## 2020-11-11 DIAGNOSIS — Z6826 Body mass index (BMI) 26.0-26.9, adult: Secondary | ICD-10-CM | POA: Diagnosis not present

## 2020-11-11 DIAGNOSIS — M459 Ankylosing spondylitis of unspecified sites in spine: Secondary | ICD-10-CM | POA: Diagnosis not present

## 2020-11-11 DIAGNOSIS — E663 Overweight: Secondary | ICD-10-CM | POA: Diagnosis not present

## 2020-11-11 DIAGNOSIS — M1009 Idiopathic gout, multiple sites: Secondary | ICD-10-CM | POA: Diagnosis not present

## 2020-11-11 DIAGNOSIS — M150 Primary generalized (osteo)arthritis: Secondary | ICD-10-CM | POA: Diagnosis not present

## 2020-11-19 ENCOUNTER — Other Ambulatory Visit: Payer: Self-pay

## 2020-11-19 ENCOUNTER — Other Ambulatory Visit: Payer: Self-pay | Admitting: Gastroenterology

## 2020-11-19 ENCOUNTER — Encounter: Payer: Self-pay | Admitting: Gastroenterology

## 2020-11-19 ENCOUNTER — Ambulatory Visit: Payer: 59 | Admitting: Gastroenterology

## 2020-11-19 DIAGNOSIS — K529 Noninfective gastroenteritis and colitis, unspecified: Secondary | ICD-10-CM | POA: Diagnosis not present

## 2020-11-19 MED ORDER — RIFAXIMIN 550 MG PO TABS: 550.0000 mg | ORAL_TABLET | Freq: Three times a day (TID) | ORAL | 0 refills | Status: DC

## 2020-11-19 NOTE — Patient Instructions (Signed)
Please have blood work done when you are able.  I have sent in Xifaxan to take three times a day for 14 days, then stop. Please let us know how this works for you! If not helpful, we will need to discuss other options.  We will see you in 4 months!  It was a pleasure to see you today. I want to create trusting relationships with patients to provide genuine, compassionate, and quality care. I value your feedback. If you receive a survey regarding your visit,  I greatly appreciate you taking time to fill this out.   Annitta Needs, PhD, ANP-BC [facility]

## 2020-11-19 NOTE — Progress Notes (Signed)
Referring Provider: Caren Macadam, MD Primary Care Physician:  Craig Macadam, MD  Primary GI: Dr. Abbey Wagner  Chief Complaint  Patient presents with  . Diarrhea    Pt states he is having it a lot     HPI:   Craig Wagner is a 65 y.o. male presenting today with a history of chronic intermittent diarrhea dating back to his teen years. Colonoscopy recently completed with non-bleeding internal hemorrhoids. Many diverticula in entire colon. Localized mild inflammation in sigmoid colon. Colon mucosa with hyperemia, negative inflammation.   Chronic intermittent diarrhea dating back to teen years. 2 bottle of kaopectate a week at times. Kaopectate helps sometimes and sometimes doesn't. Diarrhea will flare up every few months. Postprandial component. Has done a BRAT diet without improvement. Gallbladder present. On metformin. Took a drug holiday a few weeks but still had it. Half the time has diarrhea in a month. Dicyclomine TID in the past without much help. Fiber worsens.    Past Medical History:  Diagnosis Date  . Degenerative joint disease   . Diabetes mellitus without complication ([place])   . Diverticulitis   . Gout   . Hypertension   . Irritable bowel syndrome   . Sleep apnea     Past Surgical History:  Procedure Laterality Date  . BIOPSY  10/06/2020   Procedure: BIOPSY;  Surgeon: Craig Harman, DO;  [facility];  Service: Endoscopy;;  . COLONOSCOPY WITH PROPOFOL N/A 10/06/2020   Colonoscopy with non-bleeding internal hemorrhoids. Many diverticula in entire colon. Localized mild inflammation in sigmoid colon. Colon mucosa with hyperemia, negative inflammation.   . compound fx tibia and fibula    . WRIST SURGERY Left     Current Outpatient Medications  Medication Sig Dispense Refill  . allopurinol (ZYLOPRIM) 100 MG tablet Take 1 tablet (100 mg total) by mouth 2 (two) times daily. 180 tablet 1  . amLODipine (NORVASC) 10 MG tablet Take 1 tablet (10 mg  total) by mouth daily. 90 tablet 3  . atorvastatin (LIPITOR) 20 MG tablet Take 1 tablet (20 mg total) by mouth daily. 90 tablet 3  . buPROPion (WELLBUTRIN XL) 150 MG 24 hr tablet Take 150 mg by mouth every morning.    . colchicine 0.6 MG tablet Take 0.6 mg by mouth daily as needed (gout).    Marland Kitchen FARXIGA 10 MG TABS tablet Take 10 mg by mouth daily.    . finasteride (PROPECIA) 1 MG tablet Take 1 tablet (1 mg total) by mouth daily. 90 tablet 3  . GLIPIZIDE XL 5 MG 24 hr tablet Take 5 mg by mouth daily.    . indomethacin (INDOCIN) 50 MG capsule Take 50 mg by mouth daily as needed (gout pain.).    Marland Kitchen losartan (COZAAR) 100 MG tablet Take 100 mg by mouth daily.    . metFORMIN (GLUCOPHAGE-XR) 750 MG 24 hr tablet Take 1 tablet by mouth daily.    . rifaximin (XIFAXAN) 550 MG TABS tablet Take 1 tablet (550 mg total) by mouth 3 (three) times daily for 14 days. 42 tablet 0  . sertraline (ZOLOFT) 100 MG tablet Take 150 mg by mouth daily.     No current facility-administered medications for this visit.    Allergies as of 11/19/2020  . (No Known Allergies)    Family History  Problem Relation Age of Onset  . Cancer Mother   . Heart disease Father   . Colon cancer Neg Hx   . Colon polyps  Neg Hx     Social History   Socioeconomic History  . Marital status: Married    Spouse name: Not on file  . Number of children: Not on file  . Years of education: Not on file  . Highest education level: Not on file  Occupational History  . Not on file  Tobacco Use  . Smoking status: Never Smoker  . Smokeless tobacco: Never Used  Vaping Use  . Vaping Use: Never used  Substance and Sexual Activity  . Alcohol use: Yes    Alcohol/week: 1.0 standard drink    Types: 1 Cans of beer per week    Comment: 1 per day but cutting back  . Drug use: No  . Sexual activity: Not Currently    Partners: Female  Other Topics Concern  . Not on file  Social History Narrative   Grew up in [place]    Moved to [place] for  school.   Father was a Risk analyst in the [employer].   Works part-time.   Married for 28 years.    Does not have children.   Has a lot of pets.   Takes care of mules, dogs, pets.   Has a motorcycle.       Social Determinants of Health   Financial Resource Strain: Not on file  Food Insecurity: Not on file  Transportation Needs: Not on file  Physical Activity: Not on file  Stress: Not on file  Social Connections: Not on file    Review of Systems: Gen: Denies fever, chills, anorexia. Denies fatigue, weakness, weight loss.  CV: Denies chest pain, palpitations, syncope, peripheral edema, and claudication. Resp: Denies dyspnea at rest, cough, wheezing, coughing up blood, and pleurisy. GI: see HPI Derm: Denies rash, itching, dry skin Psych: Denies depression, anxiety, memory loss, confusion. No homicidal or suicidal ideation.  Heme: Denies bruising, bleeding, and enlarged lymph nodes.  Physical Exam: BP (!) 141/92   Pulse 69   Temp (!) 96.9 F (36.1 C)   Ht 6' (1.829 m)   Wt 191 lb 9.6 oz (86.9 kg)   BMI 25.99 kg/m  General:   Alert and oriented. No distress noted. Pleasant and cooperative.  Head:  Normocephalic and atraumatic. Eyes:  Conjuctiva clear without scleral icterus. Mouth:  Mask in place Abdomen:  +BS, soft, non-tender and non-distended. No rebound or guarding. No HSM or masses noted. Msk:  Symmetrical without gross deformities. Normal posture. Extremities:  Without edema. Neurologic:  Alert and  oriented x4 Psych:  Alert and cooperative. Normal mood and affect.  ASSESSMENT: Craig Wagner is a 65 y.o. male presenting today with history of chronic diarrhea dating back to his teen years, recently undergoing colonoscopy that was overall unrevealing. Focal mild inflammation in sigmoid colon non-specific.  Differentials broad. Could definitely be med effect, although he notes holding metformin for a drug holiday but still resulted in diarrhea. No alarm signs/symptoms. Could be  dealing with IBS, doubt celiac disease but will order serologies to ensure this is negative. Less likely pancreatic insufficiency. I note he does drink alcohol but seems to be sparingly at this point (about once per week).   Bentyl failed in the past. Doubt Levsin would be much help. We discussed Viberzi and possible side effects; he would rather avoid this. I have sent in Xifaxan to take TID for 2 weeks then stop for treatment of IBS-D. Hopefully, this course can be helpful and he can have better overall symptom management. If not, will need to discuss  other options.  Celiac serologies and TSH ordered today as well.    PLAN:  Celiac serologies, TSH Xifaxan 550 mg TID for 14 days Call with report Return in 4 months  Annitta Needs, PhD, [facility]

## 2020-11-26 ENCOUNTER — Telehealth: Payer: Self-pay

## 2020-11-26 MED FILL — XIFAXAN 550 MG TABLET: 550 | 14 days supply | Qty: 42 | Fill #0

## 2020-11-26 NOTE — Telephone Encounter (Signed)
Pt. Approved for Xifaxan 550mg  .

## 2021-02-03 ENCOUNTER — Other Ambulatory Visit (HOSPITAL_COMMUNITY): Payer: Self-pay

## 2021-07-08 ENCOUNTER — Other Ambulatory Visit: Payer: Self-pay

## 2021-07-08 ENCOUNTER — Encounter: Payer: Self-pay | Admitting: Emergency Medicine

## 2021-07-08 ENCOUNTER — Ambulatory Visit
Admission: EM | Admit: 2021-07-08 | Discharge: 2021-07-08 | Disposition: A | Discharge status: Home / Self Care | Payer: 59 | Attending: Family Medicine | Admitting: Family Medicine

## 2021-07-08 DIAGNOSIS — M7022 Olecranon bursitis, left elbow: Secondary | ICD-10-CM | POA: Diagnosis not present

## 2021-07-08 NOTE — ED Provider Notes (Signed)
[place]   [id number] 07/08/21 Arrival Time: 1700  ASSESSMENT & PLAN:  1. Olecranon bursitis of left elbow    Procedure: Aspiration Left elbow over olecranon prepped in usual fashion. Pain Ease spray applied. 18g needle inserted with drainage of serosanguinous fluid. No complications. Bandaged. No signs of infection.  To take ibuprofen with food next 24-48 hours. ACE bandage applied. To wear next 48 hours.  Orders Placed This Encounter  Procedures   Apply ace wrap    Recommend:  Follow-up Information     Caren Macadam, MD.   Specialty: Family Medicine Why: As needed. Contact information: [place] [address] [phone]                 Reviewed expectations re: course of current medical issues. Questions answered. Outlined signs and symptoms indicating need for more acute intervention. Patient verbalized understanding. After Visit Summary given.  SUBJECTIVE: History from: patient. Craig Wagner is a 65 y.o. male who reports swelling over LEFT elbow; x few weeks after bumping elbow. Minimal discomfort. No extremity sensation changes or weakness. Afebrile. No overlying skin changes.   Past Surgical History:  Procedure Laterality Date   BIOPSY  10/06/2020   Procedure: BIOPSY;  Surgeon: Eloise Harman, DO;  [facility];  Service: Endoscopy;;   COLONOSCOPY WITH PROPOFOL N/A 10/06/2020   Colonoscopy with non-bleeding internal hemorrhoids. Many diverticula in entire colon. Localized mild inflammation in sigmoid colon. Colon mucosa with hyperemia, negative inflammation.    compound fx tibia and fibula     WRIST SURGERY Left     OBJECTIVE:  Vitals:   07/08/21 1729  BP: (!) 152/84  Pulse: 71  Resp: 16  Temp: 98.6 F (37 C)  TempSrc: Oral  SpO2: 96%    General appearance: alert; no distress HEENT: [place]; AT Neck: supple with FROM Resp: unlabored respirations Extremities: LUE: warm with well perfused  appearance; area of fluid buildup/swelling over olecranon; no overlying erythema; elbow with FROM CV: brisk extremity capillary refill of LUE; 2+ radial pulse of LUE. Skin: warm and dry; no visible rashes Neurologic: gait normal; normal sensation and strength of LUE Psychological: alert and cooperative; normal mood and affect    No Known Allergies  Past Medical History:  Diagnosis Date   Degenerative joint disease    Diabetes mellitus without complication ([facility])    Diverticulitis    Gout    Hypertension    Irritable bowel syndrome    Sleep apnea    Social History   Socioeconomic History   Marital status: Married    Spouse name: Not on file   Number of children: Not on file   Years of education: Not on file   Highest education level: Not on file  Occupational History   Not on file  Tobacco Use   Smoking status: Never   Smokeless tobacco: Never  Vaping Use   Vaping Use: Never used  Substance and Sexual Activity   Alcohol use: Yes    Alcohol/week: 1.0 standard drink    Types: 1 Cans of beer per week    Comment: 1 per day but cutting back   Drug use: No   Sexual activity: Not Currently    Partners: Female  Other Topics Concern   Not on file  Social History Narrative   Grew up in [place]    Moved to [place] for school.   Father was a Risk analyst in the [employer].   Works part-time.   Married  for 28 years.    Does not have children.   Has a lot of pets.   Takes care of mules, dogs, pets.   Has a motorcycle.       Social Determinants of Health   Financial Resource Strain: Not on file  Food Insecurity: Not on file  Transportation Needs: Not on file  Physical Activity: Not on file  Stress: Not on file  Social Connections: Not on file   Family History  Problem Relation Age of Onset   Cancer Mother    Heart disease Father    Colon cancer Neg Hx    Colon polyps Neg Hx    Past Surgical History:  Procedure Laterality Date   BIOPSY  10/06/2020   Procedure: BIOPSY;   Surgeon: Eloise Harman, DO;  [facility];  Service: Endoscopy;;   COLONOSCOPY WITH PROPOFOL N/A 10/06/2020   Colonoscopy with non-bleeding internal hemorrhoids. Many diverticula in entire colon. Localized mild inflammation in sigmoid colon. Colon mucosa with hyperemia, negative inflammation.    compound fx tibia and fibula     WRIST SURGERY Left        Vanessa Kick, MD 07/09/21 [phone]

## 2021-07-08 NOTE — ED Triage Notes (Signed)
Knot on left elbow x several weeks.  Knot has gotten bigger over the last few days.

## 2021-11-13 ENCOUNTER — Other Ambulatory Visit (HOSPITAL_COMMUNITY): Payer: Self-pay

## 2021-11-13 DIAGNOSIS — I10 Essential (primary) hypertension: Secondary | ICD-10-CM | POA: Diagnosis not present

## 2021-11-13 DIAGNOSIS — M109 Gout, unspecified: Secondary | ICD-10-CM | POA: Diagnosis not present

## 2021-11-13 DIAGNOSIS — E1169 Type 2 diabetes mellitus with other specified complication: Secondary | ICD-10-CM | POA: Diagnosis not present

## 2021-11-13 DIAGNOSIS — Z125 Encounter for screening for malignant neoplasm of prostate: Secondary | ICD-10-CM | POA: Diagnosis not present

## 2021-11-13 DIAGNOSIS — Z0000 Encounter for general adult medical examination without abnormal findings: Secondary | ICD-10-CM | POA: Diagnosis not present

## 2021-11-13 DIAGNOSIS — E785 Hyperlipidemia, unspecified: Secondary | ICD-10-CM | POA: Diagnosis not present

## 2021-11-13 MED ORDER — LISINOPRIL-HYDROCHLOROTHIAZIDE 20-12.5 MG PO TABS
1.0000 | ORAL_TABLET | Freq: Every day | ORAL | 2 refills | Status: DC
  Filled 2021-11-13: qty 30, 30d supply, fill #0
  Filled 2021-12-29: qty 30, 30d supply, fill #1

## 2021-11-14 ENCOUNTER — Other Ambulatory Visit (HOSPITAL_COMMUNITY): Payer: Self-pay

## 2021-11-14 MED ORDER — ATORVASTATIN CALCIUM 20 MG PO TABS
20.0000 mg | ORAL_TABLET | Freq: Every day | ORAL | 1 refills | Status: DC
  Filled 2021-11-14: qty 90, 90d supply, fill #0

## 2021-11-14 MED ORDER — METFORMIN HCL ER 500 MG PO TB24
ORAL_TABLET | ORAL | 0 refills | Status: DC
  Filled 2021-11-14: qty 106, 32d supply, fill #0

## 2021-11-16 ENCOUNTER — Other Ambulatory Visit (HOSPITAL_COMMUNITY): Payer: Self-pay

## 2021-12-21 ENCOUNTER — Other Ambulatory Visit (HOSPITAL_COMMUNITY): Payer: Self-pay

## 2021-12-21 DIAGNOSIS — I10 Essential (primary) hypertension: Secondary | ICD-10-CM | POA: Diagnosis not present

## 2021-12-21 DIAGNOSIS — F331 Major depressive disorder, recurrent, moderate: Secondary | ICD-10-CM | POA: Diagnosis not present

## 2021-12-21 DIAGNOSIS — E1169 Type 2 diabetes mellitus with other specified complication: Secondary | ICD-10-CM | POA: Diagnosis not present

## 2021-12-21 DIAGNOSIS — N529 Male erectile dysfunction, unspecified: Secondary | ICD-10-CM | POA: Diagnosis not present

## 2021-12-21 DIAGNOSIS — L659 Nonscarring hair loss, unspecified: Secondary | ICD-10-CM | POA: Diagnosis not present

## 2021-12-21 DIAGNOSIS — E782 Mixed hyperlipidemia: Secondary | ICD-10-CM | POA: Diagnosis not present

## 2021-12-21 MED ORDER — SILDENAFIL CITRATE 20 MG PO TABS
40.0000 mg | ORAL_TABLET | ORAL | 0 refills | Status: DC | PRN
  Filled 2021-12-21: qty 30, 6d supply, fill #0

## 2021-12-21 MED ORDER — FINASTERIDE 1 MG PO TABS
1.0000 mg | ORAL_TABLET | Freq: Every day | ORAL | 1 refills | Status: DC
  Filled 2021-12-21: qty 90, 90d supply, fill #0

## 2021-12-21 MED ORDER — FARXIGA 10 MG PO TABS
10.0000 mg | ORAL_TABLET | Freq: Every day | ORAL | 0 refills | Status: DC
  Filled 2021-12-21: qty 90, 90d supply, fill #0

## 2021-12-29 ENCOUNTER — Other Ambulatory Visit (HOSPITAL_COMMUNITY): Payer: Self-pay

## 2022-09-19 ENCOUNTER — Ambulatory Visit (INDEPENDENT_AMBULATORY_CARE_PROVIDER_SITE_OTHER): Payer: 59

## 2022-09-19 ENCOUNTER — Encounter: Payer: Self-pay | Admitting: Emergency Medicine

## 2022-09-19 ENCOUNTER — Ambulatory Visit
Admission: EM | Admit: 2022-09-19 | Discharge: 2022-09-19 | Disposition: A | Discharge status: Home / Self Care | Payer: 59 | Attending: Nurse Practitioner | Admitting: Nurse Practitioner

## 2022-09-19 DIAGNOSIS — S8391XA Sprain of unspecified site of right knee, initial encounter: Secondary | ICD-10-CM | POA: Diagnosis not present

## 2022-09-19 DIAGNOSIS — M25561 Pain in right knee: Secondary | ICD-10-CM | POA: Diagnosis not present

## 2022-09-19 DIAGNOSIS — W19XXXA Unspecified fall, initial encounter: Secondary | ICD-10-CM | POA: Diagnosis not present

## 2022-09-19 NOTE — Discharge Instructions (Addendum)
The x-rays are negative for fracture or dislocation. Recommend continuing the use of over-the-counter ibuprofen as needed for pain or discomfort. Use the knee brace that is provided to provide compression and support along with increased stability. RICE therapy, rest, ice, compression, and elevation. If symptoms do not improve over the next 1 to 2 weeks, recommend that you follow-up with orthopedics.  You may follow-up with [facility] [place] at [phone] or with [facility] at [phone]. Follow-up as needed.

## 2022-09-19 NOTE — ED Triage Notes (Signed)
Fell due to leg weakness yesterday.  C/o right leg pain below and above knee area.

## 2022-09-19 NOTE — ED Provider Notes (Signed)
[facility]    CSN: [id number] Arrival date & time: 09/19/22  1435      History   Chief Complaint No chief complaint on file.   HPI Craig Wagner is a 66 y.o. male.   The history is provided by the patient.   Patient presents for complaints of right knee pain.  Patient states that he fell 1 day ago due to right leg weakness.  Today he presents with pain to the lateral aspect of the right knee.  Patient states pain worsens when he extends the knee.  He states that he can bend it up or to about a 90 degree angle.  He denies swelling, inability to bear weight, numbness, tingling, or radiation of pain.  Patient does have a history of osteoarthritis of the left knee.  Patient states he has been taking ibuprofen for his symptoms. Past Medical History:  Diagnosis Date   Degenerative joint disease    Diabetes mellitus without complication ([place])    Diverticulitis    Gout    Hypertension    Irritable bowel syndrome    Sleep apnea     Patient Active Problem List   Diagnosis Date Noted   Chronic diarrhea 11/19/2020   Type 2 diabetes mellitus with hyperglycemia, without long-term current use of insulin ([place]) 06/08/2018   Depression, recurrent ([place]) 06/08/2018   Hyperlipidemia associated with type 2 diabetes mellitus ([place]) 06/08/2018   Essential hypertension 06/08/2018   Ankylosing spondylitis ([place]) 10/07/2015   Osteoarthritis of left knee 10/07/2015   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 Many diverticula in entire colon. Localized mild inflammation in sigmoid colon. Colon mucosa with hyperemia, negative inflammation.    compound fx tibia and fibula     WRIST SURGERY Left         Home Medications    Prior to Admission medications   Medication Sig Start Date End Date Taking? Authorizing Provider  atorvastatin (LIPITOR) 20 MG tablet TAKE 1 TABLET BY MOUTH ONCE DAILY 09/26/20 09/26/21  Caren Macadam, MD  atorvastatin (LIPITOR) 20 MG tablet Take 1 tablet (20 mg total) by mouth daily. 11/14/21     buPROPion (WELLBUTRIN XL) 150 MG 24 hr tablet Take 150 mg by mouth every morning. 09/25/20   [provider]  buPROPion (WELLBUTRIN XL) 150 MG 24 hr tablet TAKE 1 TABLET BY MOUTH ONCE DAILY EVERY MORNING 10/23/20 10/23/21  Caren Macadam, MD  buPROPion (WELLBUTRIN XL) 150 MG 24 hr tablet TAKE 1 TABLET BY MOUTH ONCE DAILY EVERY MORNING 03/18/20 03/18/21  Caren Macadam, MD  dapagliflozin propanediol (FARXIGA) 10 MG TABS tablet Take 1 tablet (10 mg total) by mouth daily. 12/21/21     finasteride (PROPECIA) 1 MG tablet Take 1 tablet (1 mg total) by mouth daily. 12/21/21     lisinopril-hydrochlorothiazide (ZESTORETIC) 20-12.5 MG tablet Take 1 tablet by mouth daily. 11/13/21     metFORMIN (GLUCOPHAGE-XR) 500 MG 24 hr tablet Take 1 tablet (500 mg total) by mouth every evening with a meal for 7 days, THEN 2 tablets (1,000 mg total) 2 (two) times daily. 11/14/21 12/21/21    sildenafil (REVATIO) 20 MG tablet Take 2-5 tablets (40-100 mg total) by mouth 30 minutes before needed. 12/21/21  Family History Family History  Problem Relation Age of Onset   Cancer Mother    Heart disease Father    Colon cancer Neg Hx    Colon polyps Neg Hx     Social History Social History   Tobacco Use   Smoking status: Never   Smokeless tobacco: Never  Vaping Use   Vaping Use: Never used  Substance Use Topics   Alcohol use: Yes    Alcohol/week: 1.0 standard drink of alcohol    Types: 1 Cans of beer per week    Comment: 1 per day but cutting back   Drug use: No     Allergies   Patient has no known allergies.   Review of Systems Review of Systems Per HPI  Physical  Exam Triage Vital Signs ED Triage Vitals  Enc Vitals Group     BP 09/19/22 1507 (!) 133/93     Pulse Rate 09/19/22 1507 (!) 106     Resp 09/19/22 1507 18     Temp 09/19/22 1507 98.3 F (36.8 C)     Temp Source 09/19/22 1507 Oral     SpO2 09/19/22 1507 97 %     Weight --      Height --      Head Circumference --      Peak Flow --      Pain Score 09/19/22 1509 10     Pain Loc --      Pain Edu? --      Excl. in [place]? --    No data found.  Updated Vital Signs BP (!) 133/93 (BP Location: Left Arm)   Pulse (!) 106   Temp 98.3 F (36.8 C) (Oral)   Resp 18   SpO2 97%   Visual Acuity Right Eye Distance:   Left Eye Distance:   Bilateral Distance:    Right Eye Near:   Left Eye Near:    Bilateral Near:     Physical Exam Vitals and nursing note reviewed.  Constitutional:      General: He is not in acute distress.    Appearance: Normal appearance.  HENT:     Head: Normocephalic.  Musculoskeletal:     Right knee: No swelling or deformity. Decreased range of motion. Tenderness present over the lateral joint line. Normal pulse.  Skin:    General: Skin is warm and dry.  Neurological:     General: No focal deficit present.     Mental Status: He is alert and oriented to person, place, and time.  Psychiatric:        Mood and Affect: Mood normal.        Behavior: Behavior normal.      UC Treatments / Results  Labs (all labs ordered are listed, but only abnormal results are displayed) Labs Reviewed - No data to display  EKG   Radiology DG Knee Complete 4 Views Right  Result Date: 09/19/2022 CLINICAL DATA:  right knee pain after fall x 1 days EXAM: RIGHT KNEE - COMPLETE 4+ VIEW COMPARISON:  None Available. FINDINGS: No evidence of fracture, dislocation, or joint effusion. Severe tricompartmental degenerative changes of the knee. Soft tissues are unremarkable. Vascular calcifications. IMPRESSION: 1. No acute displaced fracture or dislocation. 2. Severe tricompartmental  degenerative changes of the knee. Electronically Signed   By: Iven Finn M.D.   On: 09/19/2022 15:57    Procedures Procedures (including critical care time)  Medications Ordered in UC Medications - No data to display  Initial  Impression / Assessment and Plan / UC Course  I have reviewed the triage vital signs and the nursing notes.  Pertinent labs & imaging results that were available during my care of the patient were reviewed by me and considered in my medical decision making (see chart for details).  Patient presents for complaints of right knee pain after fall that occurred 1 day ago.  Patient reports instability of the right knee.  Pain worsens with extension of the knee.  He does have pain with weightbearing.  Based on the mechanism of injury of the fall, symptoms are consistent with a right knee sprain.  X-rays are negative for fracture or dislocation.  X-ray does show tricompartmental degenerative changes of the knee.  Knee brace was provided to provide compression, support, and stability of his symptoms.  Recommend continue over-the-counter use of ibuprofen for pain.  RICE therapy was also recommended.  Patient was advised to follow-up with orthopedics if symptoms do not improve over the next 1 to 2 weeks.  Patient was given information for [facility] and for [facility].  Patient verbalizes understanding.  All questions were answered.  Patient stable for discharge. Final Clinical Impressions(s) / UC Diagnoses   Final diagnoses:  Sprain of right knee, unspecified ligament, initial encounter     Discharge Instructions      The x-rays are negative for fracture or dislocation. Recommend continuing the use of over-the-counter ibuprofen as needed for pain or discomfort. Use the knee brace that is provided to provide compression and support along with increased stability. RICE therapy, rest, ice, compression, and elevation. If symptoms do not improve over the next 1  to 2 weeks, recommend that you follow-up with orthopedics.  You may follow-up with [facility] [place] at [phone] or with [facility] at [phone]. Follow-up as needed.     ED Prescriptions   None    PDMP not reviewed this encounter.   Tish Men, NP 09/19/22 [phone]

## 2022-09-20 ENCOUNTER — Telehealth: Payer: Self-pay | Admitting: Orthopedic Surgery

## 2022-09-20 NOTE — Telephone Encounter (Signed)
Patient called, lvm wanting to know if we accept [employer] and where we send patient's for MRI's.  I called him back, lvm and stated that we do accept [employer] and typically our providers will send the patient for a MRI wherever they can get them in the soonest.  I told him he he needed/wanted to schedule an appointment to please call us tomorrow.  Pt's # [phone]

## 2022-09-24 ENCOUNTER — Emergency Department (HOSPITAL_COMMUNITY): Payer: 59

## 2022-09-24 ENCOUNTER — Other Ambulatory Visit: Payer: Self-pay

## 2022-09-24 ENCOUNTER — Encounter (HOSPITAL_COMMUNITY): Payer: Self-pay

## 2022-09-24 ENCOUNTER — Emergency Department (HOSPITAL_COMMUNITY)
Admission: EM | Admit: 2022-09-24 | Discharge: 2022-09-28 | Disposition: A | Discharge status: Other Acute Inpatient Hospital | Payer: 59 | Attending: Emergency Medicine | Admitting: Emergency Medicine

## 2022-09-24 DIAGNOSIS — I10 Essential (primary) hypertension: Secondary | ICD-10-CM | POA: Insufficient documentation

## 2022-09-24 DIAGNOSIS — M7989 Other specified soft tissue disorders: Secondary | ICD-10-CM | POA: Diagnosis not present

## 2022-09-24 DIAGNOSIS — Z7984 Long term (current) use of oral hypoglycemic drugs: Secondary | ICD-10-CM | POA: Diagnosis not present

## 2022-09-24 DIAGNOSIS — R739 Hyperglycemia, unspecified: Secondary | ICD-10-CM

## 2022-09-24 DIAGNOSIS — Z9181 History of falling: Secondary | ICD-10-CM | POA: Diagnosis not present

## 2022-09-24 DIAGNOSIS — R2681 Unsteadiness on feet: Secondary | ICD-10-CM | POA: Diagnosis not present

## 2022-09-24 DIAGNOSIS — R42 Dizziness and giddiness: Secondary | ICD-10-CM | POA: Diagnosis not present

## 2022-09-24 DIAGNOSIS — Z20822 Contact with and (suspected) exposure to covid-19: Secondary | ICD-10-CM | POA: Diagnosis not present

## 2022-09-24 DIAGNOSIS — R45851 Suicidal ideations: Secondary | ICD-10-CM | POA: Insufficient documentation

## 2022-09-24 DIAGNOSIS — M25461 Effusion, right knee: Secondary | ICD-10-CM | POA: Diagnosis not present

## 2022-09-24 DIAGNOSIS — T68XXXA Hypothermia, initial encounter: Secondary | ICD-10-CM | POA: Diagnosis not present

## 2022-09-24 DIAGNOSIS — E860 Dehydration: Secondary | ICD-10-CM | POA: Insufficient documentation

## 2022-09-24 DIAGNOSIS — R69 Illness, unspecified: Secondary | ICD-10-CM | POA: Diagnosis not present

## 2022-09-24 DIAGNOSIS — E1165 Type 2 diabetes mellitus with hyperglycemia: Secondary | ICD-10-CM | POA: Insufficient documentation

## 2022-09-24 DIAGNOSIS — W19XXXA Unspecified fall, initial encounter: Secondary | ICD-10-CM | POA: Insufficient documentation

## 2022-09-24 DIAGNOSIS — R296 Repeated falls: Secondary | ICD-10-CM | POA: Diagnosis not present

## 2022-09-24 DIAGNOSIS — F332 Major depressive disorder, recurrent severe without psychotic features: Secondary | ICD-10-CM | POA: Diagnosis not present

## 2022-09-24 DIAGNOSIS — S80919A Unspecified superficial injury of unspecified knee, initial encounter: Secondary | ICD-10-CM | POA: Diagnosis not present

## 2022-09-24 DIAGNOSIS — I959 Hypotension, unspecified: Secondary | ICD-10-CM | POA: Diagnosis not present

## 2022-09-24 DIAGNOSIS — Z046 Encounter for general psychiatric examination, requested by authority: Secondary | ICD-10-CM

## 2022-09-24 DIAGNOSIS — Z743 Need for continuous supervision: Secondary | ICD-10-CM | POA: Diagnosis not present

## 2022-09-24 DIAGNOSIS — Z79899 Other long term (current) drug therapy: Secondary | ICD-10-CM | POA: Insufficient documentation

## 2022-09-24 DIAGNOSIS — S0990XA Unspecified injury of head, initial encounter: Secondary | ICD-10-CM | POA: Diagnosis not present

## 2022-09-24 LAB — CBC WITH DIFFERENTIAL/PLATELET
Abs Immature Granulocytes: 0.04 10*3/uL (ref 0.00–0.07)
Basophils Absolute: 0.1 10*3/uL (ref 0.0–0.1)
Basophils Relative: 1 %
Eosinophils Absolute: 0 10*3/uL (ref 0.0–0.5)
Eosinophils Relative: 0 %
HCT: 48.7 % (ref 39.0–52.0)
Hemoglobin: 17.3 g/dL — ABNORMAL HIGH (ref 13.0–17.0)
Immature Granulocytes: 0 %
Lymphocytes Relative: 18 %
Lymphs Abs: 2.1 10*3/uL (ref 0.7–4.0)
MCH: 32.3 pg (ref 26.0–34.0)
MCHC: 35.5 g/dL (ref 30.0–36.0)
MCV: 90.9 fL (ref 80.0–100.0)
Monocytes Absolute: 0.6 10*3/uL (ref 0.1–1.0)
Monocytes Relative: 6 %
Neutro Abs: 8.6 10*3/uL — ABNORMAL HIGH (ref 1.7–7.7)
Neutrophils Relative %: 75 %
Platelets: 178 10*3/uL (ref 150–400)
RBC: 5.36 MIL/uL (ref 4.22–5.81)
RDW: 11.9 % (ref 11.5–15.5)
WBC: 11.4 10*3/uL — ABNORMAL HIGH (ref 4.0–10.5)
nRBC: 0 % (ref 0.0–0.2)

## 2022-09-24 LAB — COMPREHENSIVE METABOLIC PANEL
ALT: 38 U/L (ref 0–44)
AST: 31 U/L (ref 15–41)
Albumin: 4.6 g/dL (ref 3.5–5.0)
Alkaline Phosphatase: 66 U/L (ref 38–126)
Anion gap: 16 — ABNORMAL HIGH (ref 5–15)
BUN: 34 mg/dL — ABNORMAL HIGH (ref 8–23)
CO2: 19 mmol/L — ABNORMAL LOW (ref 22–32)
Calcium: 9.6 mg/dL (ref 8.9–10.3)
Chloride: 96 mmol/L — ABNORMAL LOW (ref 98–111)
Creatinine, Ser: 1.53 mg/dL — ABNORMAL HIGH (ref 0.61–1.24)
GFR, Estimated: 50 mL/min — ABNORMAL LOW (ref >60)
Glucose, Bld: 251 mg/dL — ABNORMAL HIGH (ref 70–99)
Potassium: 3.4 mmol/L — ABNORMAL LOW (ref 3.5–5.1)
Sodium: 131 mmol/L — ABNORMAL LOW (ref 135–145)
Total Bilirubin: 1.4 mg/dL — ABNORMAL HIGH (ref 0.3–1.2)
Total Protein: 8 g/dL (ref 6.5–8.1)

## 2022-09-24 LAB — CBG MONITORING, ED: Glucose-Capillary: 276 mg/dL — ABNORMAL HIGH (ref 70–99)

## 2022-09-24 LAB — ETHANOL: Alcohol, Ethyl (B): <10 mg/dL (ref <10)

## 2022-09-24 MED ORDER — LISINOPRIL-HYDROCHLOROTHIAZIDE 20-12.5 MG PO TABS: 1.0000 | ORAL_TABLET | Freq: Every day | ORAL | Status: DC

## 2022-09-24 MED ORDER — IBUPROFEN 200 MG PO TABS
600.0000 mg | ORAL_TABLET | Freq: Once | ORAL | Status: AC
  Administered 2022-09-24: 600 mg via ORAL
  Filled 2022-09-24: qty 3

## 2022-09-24 MED ORDER — ZIPRASIDONE MESYLATE 20 MG IM SOLR
INTRAMUSCULAR | Status: AC
  Administered 2022-09-24: 20 mg
  Filled 2022-09-24: qty 20

## 2022-09-24 MED ORDER — BUPROPION HCL ER (XL) 150 MG PO TB24
150.0000 mg | ORAL_TABLET | Freq: Every morning | ORAL | Status: DC
  Administered 2022-09-25 – 2022-09-28 (×4): 150 mg via ORAL
  Filled 2022-09-24 (×4): qty 1

## 2022-09-24 MED ORDER — LORAZEPAM 1 MG PO TABS
2.0000 mg | ORAL_TABLET | Freq: Once | ORAL | Status: AC
  Administered 2022-09-24: 2 mg via ORAL
  Filled 2022-09-24: qty 2

## 2022-09-24 MED ORDER — METFORMIN HCL ER 500 MG PO TB24
500.0000 mg | ORAL_TABLET | Freq: Two times a day (BID) | ORAL | Status: DC
  Administered 2022-09-25 – 2022-09-28 (×6): 500 mg via ORAL
  Filled 2022-09-24 (×6): qty 1

## 2022-09-24 MED ORDER — LISINOPRIL 10 MG PO TABS
20.0000 mg | ORAL_TABLET | Freq: Once | ORAL | Status: AC
  Administered 2022-09-24: 20 mg via ORAL
  Filled 2022-09-24: qty 2

## 2022-09-24 MED ORDER — SODIUM CHLORIDE 0.9 % IV BOLUS: 1000.0000 mL | Freq: Once | INTRAVENOUS | Status: DC

## 2022-09-24 MED ORDER — DAPAGLIFLOZIN PROPANEDIOL 10 MG PO TABS
10.0000 mg | ORAL_TABLET | Freq: Every day | ORAL | Status: DC
  Administered 2022-09-25 – 2022-09-28 (×4): 10 mg via ORAL
  Filled 2022-09-24 (×4): qty 1

## 2022-09-24 MED ORDER — STERILE WATER FOR INJECTION IJ SOLN
INTRAMUSCULAR | Status: AC
  Filled 2022-09-24: qty 10

## 2022-09-24 MED ORDER — ACETAMINOPHEN 500 MG PO TABS
1000.0000 mg | ORAL_TABLET | Freq: Once | ORAL | Status: AC
  Administered 2022-09-24: 1000 mg via ORAL
  Filled 2022-09-24: qty 2

## 2022-09-24 NOTE — ED Notes (Signed)
Pt up out of his bed walking in his room. Pt refuses to stay in bed and wants to go home. Pt placed back in bed.

## 2022-09-24 NOTE — ED Notes (Addendum)
Pt refused IV and fluids - states he will drink a lot of water and KNEE BRACE

## 2022-09-24 NOTE — ED Notes (Signed)
Pt becoming increasingly agitated/anxious and attempting to walk out his room. Pt states" I dont need to be in here, I am leaving and going home". Pt aware of IVC orders.

## 2022-09-24 NOTE — ED Provider Notes (Signed)
[facility] DEPT Provider Note   CSN: [id number] Arrival date & time: 09/24/22  1705     History  Chief Complaint  Patient presents with   Suicidal    Craig Wagner is a 66 y.o. male.  Patient is a 66 year old male with a history of IBS, hypertension, diabetes, degenerative joint disease who is presenting today under IVC from his PCPs office.  Patient reports the reason why he went to his PCP office is because last week he fell and injured his right knee and since that time he has had to use a walker because he has had difficulty with his balance and unable to put weight on his right knee.  He initially was seen at urgent care and at that time they did an x-ray which showed arthritis but no acute fracture.  He went to his doctor today because his symptoms were not improving.  He denies any lightheadedness or dizziness with standing.  He has no unilateral weakness but just reports his right leg is painful and he cannot put weight on it.  He does report drinking 1 or 2 beers per day because he likes the way they taste but denies having anything to drink for the last week.  Denies any history of withdrawal. At PCPs office patient had been noted to not take care of himself since August 08, 2023 since his wife passed away.  Reported that he had stopped taking his medication had been drinking with multiple falls and spoke to his PCP stating that he wanted to commit suicide and he keeps a gun by the bed and the only thing keeping him alive is his dogs.  They took out IVC paperwork on him.  The history is provided by the patient and medical records.       Home Medications Prior to Admission medications   Medication Sig Start Date End Date Taking? Authorizing Provider  atorvastatin (LIPITOR) 20 MG tablet TAKE 1 TABLET BY MOUTH ONCE DAILY 09/26/20 09/26/21  Caren Macadam, MD  atorvastatin (LIPITOR) 20 MG tablet Take 1 tablet (20 mg total) by mouth daily. 11/14/21      buPROPion (WELLBUTRIN XL) 150 MG 24 hr tablet Take 150 mg by mouth every morning. 09/25/20   [provider]  buPROPion (WELLBUTRIN XL) 150 MG 24 hr tablet TAKE 1 TABLET BY MOUTH ONCE DAILY EVERY MORNING 10/23/20 10/23/21  Caren Macadam, MD  buPROPion (WELLBUTRIN XL) 150 MG 24 hr tablet TAKE 1 TABLET BY MOUTH ONCE DAILY EVERY MORNING 03/18/20 03/18/21  Caren Macadam, MD  dapagliflozin propanediol (FARXIGA) 10 MG TABS tablet Take 1 tablet (10 mg total) by mouth daily. 12/21/21     finasteride (PROPECIA) 1 MG tablet Take 1 tablet (1 mg total) by mouth daily. 12/21/21     lisinopril-hydrochlorothiazide (ZESTORETIC) 20-12.5 MG tablet Take 1 tablet by mouth daily. 11/13/21     metFORMIN (GLUCOPHAGE-XR) 500 MG 24 hr tablet Take 1 tablet (500 mg total) by mouth every evening with a meal for 7 days, THEN 2 tablets (1,000 mg total) 2 (two) times daily. 11/14/21 09/24/22    sildenafil (REVATIO) 20 MG tablet Take 2-5 tablets (40-100 mg total) by mouth 30 minutes before needed. 12/21/21         Allergies    Patient has no known allergies.    Review of Systems   Review of Systems  Physical Exam Updated Vital Signs BP (!) 181/130 (BP Location: Right Arm)   Pulse 94   Temp 98.9 F (37.2  C) (Oral)   Resp 18   Ht 6' (1.829 m)   Wt 81.6 kg   SpO2 97%   BMI 24.41 kg/m  Physical Exam Vitals and nursing note reviewed.  Constitutional:      General: He is not in acute distress.    Appearance: He is well-developed.  HENT:     Head: Normocephalic and atraumatic.  Eyes:     Conjunctiva/sclera: Conjunctivae normal.     Pupils: Pupils are equal, round, and reactive to light.  Cardiovascular:     Rate and Rhythm: Normal rate and regular rhythm.     Heart sounds: No murmur heard. Pulmonary:     Effort: Pulmonary effort is normal. No respiratory distress.     Breath sounds: Normal breath sounds. No wheezing or rales.  Abdominal:     General: There is no distension.     Palpations: Abdomen is  soft.     Tenderness: There is no abdominal tenderness. There is no guarding or rebound.  Musculoskeletal:        General: Tenderness present.     Cervical back: Normal range of motion and neck supple.     Right knee: Effusion present. Decreased range of motion. Tenderness present over the medial joint line and lateral joint line.  Skin:    General: Skin is warm and dry.     Findings: No erythema or rash.  Neurological:     Mental Status: He is alert and oriented to person, place, and time. Mental status is at baseline.     Sensory: No sensory deficit.     Motor: No weakness.     Comments: Patient does have difficulty with gait however it seems to be more related to not wanting to put weight on his right leg because of discomfort.  Normal coordination.  Limping on his right leg  Psychiatric:     Comments: Patient with expressed suicidal ideation to his PCP planning to shoot himself in setting he keeps a gun at his bedside.  All of this in the setting of his wife recently passing away and patient reporting he has not been taking care of himself and drinking more.  Here patient reports that he does not want to say anything about any mental health.  He is here because of his knee     ED Results / Procedures / Treatments   Labs (all labs ordered are listed, but only abnormal results are displayed) Labs Reviewed  COMPREHENSIVE METABOLIC PANEL - Abnormal; Notable for the following components:      Result Value   Sodium 131 (*)    Potassium 3.4 (*)    Chloride 96 (*)    CO2 19 (*)    Glucose, Bld 251 (*)    BUN 34 (*)    Creatinine, Ser 1.53 (*)    Total Bilirubin 1.4 (*)    GFR, Estimated 50 (*)    Anion gap 16 (*)    All other components within normal limits  CBC WITH DIFFERENTIAL/PLATELET - Abnormal; Notable for the following components:   WBC 11.4 (*)    Hemoglobin 17.3 (*)    Neutro Abs 8.6 (*)    All other components within normal limits  CBG MONITORING, ED - Abnormal; Notable  for the following components:   Glucose-Capillary 276 (*)    All other components within normal limits  ETHANOL  RAPID URINE DRUG SCREEN, HOSP PERFORMED  URINALYSIS, ROUTINE W REFLEX MICROSCOPIC    EKG EKG Interpretation  Date/Time:  Friday September 24 2022 17:15:35 EST Ventricular Rate:  93 PR Interval:  161 QRS Duration: 96 QT Interval:  381 QTC Calculation: 474 R Axis:   83 Text Interpretation: Sinus rhythm Borderline right axis deviation Nonspecific T abnormalities, lateral leads No previous tracing Confirmed by Blanchie Dessert [phone]) on 09/24/2022 7:44:43 PM  Radiology CT Head Wo Contrast  Result Date: 09/24/2022 CLINICAL DATA:  Multiple falls for 1 week, suicidal ideations EXAM: CT HEAD WITHOUT CONTRAST TECHNIQUE: Contiguous axial images were obtained from the base of the skull through the vertex without intravenous contrast. RADIATION DOSE REDUCTION: This exam was performed according to the departmental dose-optimization program which includes automated exposure control, adjustment of the mA and/or kV according to patient size and/or use of iterative reconstruction technique. COMPARISON:  None Available. FINDINGS: Brain: Scattered hypodensities are seen within the right cerebellum, as well as throughout the periventricular and subcortical white matter. These are nonspecific, but could reflect sequela of age-indeterminate small vessel ischemic change. No other signs of acute infarct or hemorrhage. Lateral ventricles and remaining midline structures are unremarkable. No acute extra-axial fluid collections. No mass effect. Vascular: No hyperdense vessel or unexpected calcification. Skull: Normal. Negative for fracture or focal lesion. Sinuses/Orbits: No acute finding. Other: None. IMPRESSION: 1. Scattered hypodensities within the right cerebellum and bilateral periventricular and subcortical white matter, consistent with age-indeterminate small vessel ischemic change. 2. No evidence of  acute hemorrhage or mass effect. Electronically Signed   By: Randa Ngo M.D.   On: 09/24/2022 18:36    Procedures Procedures    Medications Ordered in ED Medications  buPROPion (WELLBUTRIN XL) 24 hr tablet 150 mg (has no administration in time range)  dapagliflozin propanediol (FARXIGA) tablet 10 mg (has no administration in time range)  lisinopril-hydrochlorothiazide (ZESTORETIC) 20-12.5 MG per tablet 1 tablet (has no administration in time range)  metFORMIN (GLUCOPHAGE-XR) 24 hr tablet 500 mg (has no administration in time range)  lisinopril (ZESTRIL) tablet 20 mg (20 mg Oral Given 09/24/22 2035)  ibuprofen (ADVIL) tablet 600 mg (600 mg Oral Given 09/24/22 2034)  acetaminophen (TYLENOL) tablet 1,000 mg (1,000 mg Oral Given 09/24/22 2035)  ziprasidone (GEODON) 20 MG injection (20 mg  Given 09/24/22 2129)  sterile water (preservative free) injection (  Given 09/24/22 2129)  LORazepam (ATIVAN) tablet 2 mg (2 mg Oral Given 09/24/22 2221)    ED Course/ Medical Decision Making/ A&P                           Medical Decision Making Risk OTC drugs. Prescription drug management.   Pt with multiple medical problems and comorbidities and presenting today with a complaint that caries a high risk for morbidity and mortality.  Here today under IVC commitment due to concern for his own safety.  Patient minute shin suicide and shooting himself with access to guns at home.  Patient is very angry when I tell him he is under IVC and I cannot let him leave.  He does tell me about his initial medical issue which is pain in his right knee and difficulty walking.  He also reports discontinuing his medications.  He states that he has not had anything to drink this week but typically will drink a beer or 2 a day.  Sounds like at the PCP that might have been reported a little differently.  Patient is not intoxicated at this time.  He is not displaying focal neurologic deficits but does appear to  be having  difficulty walking on his right leg.  His right leg is swollen and x-ray from last week shows that he has significant arthritis.  Low suspicion for an acute fracture at this time.  Do feel in the future patient will need an MRI of his knee but does not appear to need it at this moment.  Neurovascularly intact with intact pulses. I independently interpreted patient's labs which show a blood sugar of 251, creatinine 1.53 without recent to compare minimal hypokalemia and hyponatremia.  Mild anion gap of 16, EtOH is negative, CBC with minimal leukocytosis of 11 but otherwise normal.  I have independently visualized and interpreted pt's images today.  Head CT without evidence of bleed and radiology reports scattered hypodensities within the right cerebellum and bilateral periventricular and subcortical white matter consistent with age indeterminate small vessel ischemic changes but no acute abnormality.  Patient is able to stand and walk and does not have appear to have acute strokelike symptoms.  He does not appear to be acutely withdrawing at this time.  Did offer the patient fluids but he prefers to drink fluids and is tolerating p.o.'s.  After discussed with patient that he could not leave he became belligerent and refusing to stay.  He did require IM Geodon and then some Ativan.  He was evaluated by TTS and at this time they recommend overnight observation and repeat evaluation in the morning.          Final Clinical Impression(s) / ED Diagnoses Final diagnoses:  Suicidal ideation  Hyperglycemia  Dehydration    Rx / DC Orders ED Discharge Orders     None         Blanchie Dessert, MD 09/24/22 2333

## 2022-09-24 NOTE — Progress Notes (Signed)
Orthopedic Tech Progress Note Patient Details:  Zaydin Billey 1956-04-20 [id number] Patient stated that he already has a knee brace and that it will not work for him.  Ortho Devices Type of Ortho Device: Knee Sleeve Ortho Device/Splint Location: Right knee Ortho Device/Splint Interventions: Ordered   Post Interventions Patient Tolerated: Refused intervention  Claretta Kendra E Parish Augustine 09/24/2022, 8:26 PM

## 2022-09-24 NOTE — ED Provider Triage Note (Addendum)
Emergency Medicine Provider Triage Evaluation Note  Craig Wagner , a 66 y.o. male  was evaluated in triage.  Patient presenting from PCP.  He reportedly has had multiple falls over the past week and has struck his right knee.  He is supposed to get an MRI with his PCP.  He saw them today and they sent him here due to his failure to take care of himself since his wife passed away.  He also expressed suicidal intentions and also tells me that he had a plan to shoot himself with a gun he keeps at bedside.  Says that the only thing that is keeping him alive are his dogs.  He has not been taking any of his chronic medication either.  Denies AVH, drug or heavy alcohol use Review of Systems  Positive:  Negative:   Physical Exam  BP (!) 181/130 (BP Location: Right Arm)   Pulse 94   Temp 98.9 F (37.2 C) (Oral)   Resp 18   Ht 6' (1.829 m)   Wt 81.6 kg   SpO2 97%   BMI 24.41 kg/m  Gen:   Awake, no distress   Resp:  Normal effort  MSK:   Moves extremities without difficulty  Other:  Full range of motion of right knee intact.  No appreciable swelling.  Medical Decision Making  Medically screening exam initiated at 5:31 PM.  Appropriate orders placed.  Benen Weida was informed that the remainder of the evaluation will be completed by another provider, this initial triage assessment does not replace that evaluation, and the importance of remaining in the ED until their evaluation is complete.    IVC paperwork filled out.  I wanted to leave the patient is voluntary however when I told him that if he tried to leave we would need to take out paperwork he said "well you cannot do that, shooting yourself is a choice that anybody can make."  Believe that he is high risk for self-harm and will be cleared by psychiatric team after medical clearance.   Darliss Ridgel 09/24/22 1733  Patient declined x-ray, says it "will not show anything."   Rhae Hammock, PA-C 09/24/22 1738

## 2022-09-24 NOTE — BH Assessment (Signed)
Comprehensive Clinical Assessment (CCA) Note  09/24/2022 [place] [id number]  Disposition: Prescott Gum, NP recommends overnight observation to be reevaluated by psychiatry. RN C. Ferrier and Dr. Purcell Mouton notified of recommendation.  The patient demonstrates the following risk factors for suicide: Chronic risk factors for suicide include: demographic factors (male, >64 y/o). Acute risk factors for suicide include: loss (financial, interpersonal, professional). Protective factors for this patient include: responsibility to others (children, family). Considering these factors, the overall suicide risk at this point appears to be low. Patient is not appropriate for outpatient follow up.  [place] ED from 09/24/2022 in [place] DEPT ED from 09/19/2022 in [facility] ED from 07/08/2021 in [facility] [place] No Risk No Risk No Risk      Patient is a 66 y.o. widowed male who presents voluntarily to [facility] from his PCP after stating he wants to shoot himself and that he has guns at home. Pt's reports "I'm here for my knee. I plan to go to my ortho Monday to get a MRI of my knee." Pt denies he made a statement of wanting to kill himself, saying "that is false information." Pt adamantly denies SI or HI. Pt denies any previous suicide attempts or self injurious behaviors. Pt denies auditory or visual hallucinations. Pt acknowledges he has guns in his home and reports they are secured. Pt denies substance use.   Clinician inquired on stressors, including the noted information that Pt's spouse recently passed away. Pt reports his wife passed August 15, 2022 due to cancer which was caused "by the vaccine that is killing people. You need to look up research recently done." Pt also states his best friend recently passed away and "the healthcare system isn't doing anything about it." Pt says he receives  weekly calls from [facility], who cared for his wife and they pray for him. Pt states "I'm a normal person and I need to go home to take care of my 3 dogs." Clinician asks Pt if he has any supports and he reports they are miles away. Pt states he only has neighbors he can talk to, however his sister-in-law could possibly come to stay with him. Clinician inquired on Pt not eating or drinking, which is noted in the chart. Pt states he eats fine, when he feels like it and only could not eat due to not being able to get to the kitchen. Pt refuses to answer additional questions, stating "I'm a normal person. I love life and I want to live it."   Pt is dressed in scrubs, alert and oriented. Pt has normal speech and a flat affect. Pt presents irritated and initially refused to answer any questions. Pt answers some questions before no longer cooperating with the assessment. Pt states "I am not a prisoner and I feel like one being here." Pt states if he could walk, he would leave and go home, however his walker and car are not at the hospital. Pt states "I'm not playing this questions game."  Chief Complaint:  Chief Complaint  Patient presents with   Suicidal   Visit Diagnosis:  Suicidal Ideation   CCA Screening, Triage and Referral (STR)  Patient Reported Information How did you hear about [place]? Primary Care  What Is the Reason for Your Visit/Call Today? Pt states "I'm here for my knee. I need to go to my ortho appointment on Monday."  How Long Has  This Been Causing You Problems? <Week  What Do You Feel Would Help You the Most Today? Treatment for Depression or other mood problem   Have You Recently Had Any Thoughts About Hurting Yourself? No  Are You Planning to Commit Suicide/Harm Yourself At This time? No   Flowsheet Row ED from 09/24/2022 in [place] DEPT ED from 09/19/2022 in [facility] ED from 07/08/2021 in [place]  [facility] [place] No Risk No Risk No Risk       Have you Recently Had Thoughts About [place]? No  Are You Planning to Harm Someone at This Time? No  Explanation: N/A   Have You Used Any Alcohol or Drugs in the Past 24 Hours? No  What Did You Use and How Much? N/A   Do You Currently Have a Therapist/Psychiatrist? No  Name of Therapist/Psychiatrist: Name of Therapist/Psychiatrist: N/A   Have You Been Recently Discharged From Any Office Practice or Programs? No  Explanation of Discharge From Practice/Program: N/A     CCA Screening Triage Referral Assessment Type of Contact: Tele-Assessment  Telemedicine Service Delivery: Telemedicine service delivery: This service was provided via telemedicine using a 2-way, interactive audio and video technology  Is this Initial or Reassessment? Is this Initial or Reassessment?: Initial Assessment  Date Telepsych consult ordered in [facility]:  Date Telepsych consult ordered in [facility]: 09/24/22  Time Telepsych consult ordered in [facility]:  Time Telepsych consult ordered in [facility]: 2009  Location of Assessment: [facility] ED  Provider Location: [facility] Assessment Services   Collateral Involvement: N/A   Does Patient Have a Stage manager Guardian? No  Legal Guardian Contact Information: N/A  Copy of Legal Guardianship Form: -- (N/A)  Legal Guardian Notified of Arrival: -- (N/A)  Legal Guardian Notified of Pending Discharge: -- (N/A)  If Minor and Not Living with Parent(s), Who has Custody? N/A  Is CPS involved or ever been involved? Never  Is APS involved or ever been involved? Never   Patient Determined To Be At Risk for Harm To Self or Others Based on Review of Patient Reported Information or Presenting Complaint? Yes, for Self-Harm  Method: -- (N/A)  Availability of Means: -- (N/A)  Intent: -- (N/A)  Notification Required: -- (N/A)  Additional Information for Danger to Others Potential:  -- (N/A)  Additional Comments for Danger to Others Potential: N/A  Are There Guns or Other Weapons in Your Home? Yes  Types of Guns/Weapons: Unknown  Are These Weapons Safely Secured?                            Yes  Who Could Verify You Are Able To Have These Secured: Pt reports no local supports  Do You Have any Outstanding Charges, Pending Court Dates, Parole/Probation? N/A  Contacted To Inform of Risk of Harm To Self or Others: -- (N/A)    Does Patient Present under Involuntary Commitment? No    [place] of Residence: [place]   Patient Currently Receiving the Following Services: Not Receiving Services   Determination of Need: Emergent (2 hours)   Options For Referral: Intensive Outpatient Therapy; Inpatient Hospitalization     CCA Biopsychosocial Patient Reported Schizophrenia/Schizoaffective Diagnosis in Past: No   Strengths: Unknown, Pt refused to answer   Mental Health Symptoms Depression:   -- (Unknown, Pt refused to answer)   Duration of Depressive symptoms:    Mania:   -- (  Unknown, Pt refused to answer)   Anxiety:    -- (Unknown, Pt refused to answer)   Psychosis:   None   Duration of Psychotic symptoms:    Trauma:   -- (Unknown, Pt refused to answer)   Obsessions:   -- (Unknown, Pt refused to answer)   Compulsions:   -- (Unknown, Pt refused to answer)   Inattention:   -- (Unknown, Pt refused to answer)   Hyperactivity/Impulsivity:   -- (Unknown, Pt refused to answer)   Oppositional/Defiant Behaviors:   -- (Unknown, Pt refused to answer)   Emotional Irregularity:   -- (Unknown, Pt refused to answer)   Other Mood/Personality Symptoms:   N/A    Mental Status Exam Appearance and self-care  Stature:   Tall   Weight:   Average weight   Clothing:   -- (Hospital scrubs)   Grooming:   Normal   Cosmetic use:   None   Posture/gait:   Normal   Motor activity:   Not Remarkable   Sensorium  Attention:    Persistent   Concentration:   Preoccupied   Orientation:   X5   Recall/memory:   Normal   Affect and Mood  Affect:   Flat; Blunted   Mood:   Angry; Irritable   Relating  Eye contact:   Fleeting   Facial expression:   Responsive   Attitude toward examiner:   Irritable   Thought and Language  Speech flow:  Normal   Thought content:   Appropriate to Mood and Circumstances   Preoccupation:   None   Hallucinations:   None   Organization:   Intact   [employer] of Knowledge:   Average   Intelligence:   Average   Abstraction:   Normal   Judgement:   Fair   Art therapist:   Adequate   Insight:   Lacking   Decision Making:   Impulsive   Social Functioning  Social Maturity:   Responsible   Social Judgement:   Normal   Stress  Stressors:   Grief/losses   Coping Ability:   Programme researcher, broadcasting/film/video Deficits:   Activities of daily living   Supports:   Support needed     Religion: Religion/Spirituality Are You A Religious Person?:  (Unknown, Pt refused to answer) How Might This Affect Treatment?: N/A  Leisure/Recreation: Leisure / Recreation Do You Have Hobbies?:  (Unknown, Pt refused to answer)  Exercise/Diet: Exercise/Diet Do You Exercise?:  (Unknown, Pt refused to answer) Have You Gained or Lost A Significant Amount of Weight in the Past Six Months?:  (Unknown, Pt refused to answer) Do You Follow a Special Diet?:  (Unknown, Pt refused to answer) Do You Have Any Trouble Sleeping?:  (Unknown, Pt refused to answer)   CCA Employment/Education Employment/Work Situation: Employment / Work Situation Employment Situation:  (Unknown, Pt refused to answer) Patient's Job has Been Impacted by Current Illness:  (Unknown, Pt refused to answer) Has Patient ever Been in the [employer]?:  (Unknown, Pt refused to answer)  Education: Education Is Patient Currently Attending School?: No Last Grade Completed:  (Unknown, Pt  refused to answer) Did You Attend College?:  (Unknown, Pt refused to answer) Did You Have An Individualized Education Program (IIEP):  (Unknown, Pt refused to answer) Did You Have Any Difficulty At School?:  (Unknown, Pt refused to answer) Patient's Education Has Been Impacted by Current Illness:  (Unknown, Pt refused to answer)   CCA Family/Childhood History Family and Relationship History: Family history  Marital status: Single (Spouse recently passed Oct 2023) Does patient have children?:  (Unknown, Pt refused to answer)  Childhood History:  Childhood History By whom was/is the patient raised?:  (Unknown, Pt refused to answer) Did patient suffer any verbal/emotional/physical/sexual abuse as a child?:  (Unknown, Pt refused to answer) Did patient suffer from severe childhood neglect?:  (Unknown, Pt refused to answer) Has patient ever been sexually abused/assaulted/raped as an adolescent or adult?:  (Unknown, Pt refused to answer) Was the patient ever a victim of a crime or a disaster?:  (Unknown, Pt refused to answer) Witnessed domestic violence?:  (Unknown, Pt refused to answer) Has patient been affected by domestic violence as an adult?:  (Unknown, Pt refused to answer)       CCA Substance Use Alcohol/Drug Use: Alcohol / Drug Use Pain Medications: Ibuprofen Prescriptions: See MAR Over the Counter: See MAR History of alcohol / drug use?: No history of alcohol / drug abuse Longest period of sobriety (when/how long): N/A Negative Consequences of Use:  (N/A) Withdrawal Symptoms:  (N/A)                         ASAM's:  Six Dimensions of Multidimensional Assessment  Dimension 1:  Acute Intoxication and/or Withdrawal Potential:      Dimension 2:  Biomedical Conditions and Complications:      Dimension 3:  Emotional, Behavioral, or Cognitive Conditions and Complications:     Dimension 4:  Readiness to Change:     Dimension 5:  Relapse, Continued use, or Continued  Problem Potential:     Dimension 6:  Recovery/Living Environment:     ASAM Severity Score:    ASAM Recommended Level of Treatment:     Substance use Disorder (SUD)    Recommendations for Services/Supports/Treatments:    Discharge Disposition:    DSM5 Diagnoses: Patient Active Problem List   Diagnosis Date Noted   Chronic diarrhea 11/19/2020   Type 2 diabetes mellitus with hyperglycemia, without long-term current use of insulin ([place]) 06/08/2018   Depression, recurrent ([place]) 06/08/2018   Hyperlipidemia associated with type 2 diabetes mellitus ([place]) 06/08/2018   Essential hypertension 06/08/2018   Ankylosing spondylitis ([place]) 10/07/2015   Osteoarthritis of left knee 10/07/2015   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

## 2022-09-24 NOTE — ED Triage Notes (Signed)
Per EMS- Patient was picked up from his PCP office this afternoon. Patient told the PCP that he wanted to shoot himself and had guns at home.  Patient states he has not been taking his medications since his wife died in 08-30-23.  Patient also reports that he has had several falls and has been injuring his right knee. Patient also told the PCP that he has not been eating or drinking and is now having to crawl to the bathroom.  Patient denies blood thinners.

## 2022-09-25 ENCOUNTER — Encounter (HOSPITAL_COMMUNITY): Payer: Self-pay | Admitting: Nurse Practitioner

## 2022-09-25 DIAGNOSIS — F332 Major depressive disorder, recurrent severe without psychotic features: Secondary | ICD-10-CM | POA: Diagnosis present

## 2022-09-25 DIAGNOSIS — R69 Illness, unspecified: Secondary | ICD-10-CM | POA: Diagnosis not present

## 2022-09-25 DIAGNOSIS — Z046 Encounter for general psychiatric examination, requested by authority: Secondary | ICD-10-CM

## 2022-09-25 HISTORY — DX: Encounter for general psychiatric examination, requested by authority: Z04.6

## 2022-09-25 LAB — URINALYSIS, ROUTINE W REFLEX MICROSCOPIC
Bilirubin Urine: NEGATIVE
Glucose, UA: >=500 mg/dL — AB
Hgb urine dipstick: NEGATIVE
Ketones, ur: NEGATIVE mg/dL
Leukocytes,Ua: NEGATIVE
Nitrite: NEGATIVE
Protein, ur: NEGATIVE mg/dL
Specific Gravity, Urine: 1.02 (ref 1.005–1.030)
pH: 5.5 (ref 5.0–8.0)

## 2022-09-25 LAB — URINALYSIS, MICROSCOPIC (REFLEX): Squamous Epithelial / HPF: NONE SEEN (ref 0–5)

## 2022-09-25 LAB — RAPID URINE DRUG SCREEN, HOSP PERFORMED
Amphetamines: NOT DETECTED
Barbiturates: NOT DETECTED
Benzodiazepines: NOT DETECTED
Cocaine: NOT DETECTED
Opiates: NOT DETECTED
Tetrahydrocannabinol: NOT DETECTED

## 2022-09-25 LAB — SARS CORONAVIRUS 2 BY RT PCR: SARS Coronavirus 2 by RT PCR: NEGATIVE

## 2022-09-25 LAB — CBG MONITORING, ED: Glucose-Capillary: 303 mg/dL — ABNORMAL HIGH (ref 70–99)

## 2022-09-25 MED ORDER — HYDROCHLOROTHIAZIDE 12.5 MG PO TABS
12.5000 mg | ORAL_TABLET | Freq: Every day | ORAL | Status: DC
  Administered 2022-09-25 – 2022-09-28 (×4): 12.5 mg via ORAL
  Filled 2022-09-25 (×5): qty 1

## 2022-09-25 MED ORDER — INSULIN ASPART 100 UNIT/ML IJ SOLN
0.0000 [IU] | Freq: Three times a day (TID) | INTRAMUSCULAR | Status: DC
  Administered 2022-09-26: 2 [IU] via SUBCUTANEOUS
  Administered 2022-09-26: 7 [IU] via SUBCUTANEOUS
  Administered 2022-09-26 – 2022-09-27 (×2): 5 [IU] via SUBCUTANEOUS
  Administered 2022-09-27 – 2022-09-28 (×3): 3 [IU] via SUBCUTANEOUS
  Filled 2022-09-25: qty 0.09

## 2022-09-25 MED ORDER — INSULIN ASPART 100 UNIT/ML IJ SOLN
7.0000 [IU] | Freq: Once | INTRAMUSCULAR | Status: AC
  Administered 2022-09-25: 7 [IU] via SUBCUTANEOUS
  Filled 2022-09-25: qty 0.07

## 2022-09-25 MED ORDER — INSULIN ASPART PROT & ASPART (70-30 MIX) 100 UNIT/ML ~~LOC~~ SUSP
7.0000 [IU] | Freq: Once | SUBCUTANEOUS | Status: DC
  Filled 2022-09-25: qty 10

## 2022-09-25 MED ORDER — LISINOPRIL 20 MG PO TABS
20.0000 mg | ORAL_TABLET | Freq: Every day | ORAL | Status: DC
  Administered 2022-09-25 – 2022-09-28 (×4): 20 mg via ORAL
  Filled 2022-09-25: qty 2
  Filled 2022-09-25: qty 1
  Filled 2022-09-25 (×2): qty 2
  Filled 2022-09-25: qty 1

## 2022-09-25 NOTE — Consult Note (Signed)
[facility] ED ASSESSMENT   Reason for Consult:  SI Referring Physician:  Blanchie Dessert, MD Patient Identification: Craig Wagner MRN:  [id number] ED Chief Complaint: MDD (major depressive disorder), recurrent episode, severe ([place])  Diagnosis:  Principal Problem:   MDD (major depressive disorder), recurrent episode, severe ([place]) Active Problems:   Involuntary commitment   ED Assessment Time Calculation: Start Time: 1105 Stop Time: 1125 Total Time in Minutes (Assessment Completion): 20   Subjective:   Craig Wagner is a 66 y.o. male patient admitted with a history of IBS, hypertension, diabetes, degenerative joint disease who is presented to [facility] under IVC from his PCPs office.   HPI:  Today, the patient explains that he visited his primary care physician yesterday, primarily seeking care for his knee. He adamantly denies making any suicidal statements during the visit. 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 He emphasizes the importance of caring for them, especially his Shihpoo, which belonged to his late wife. He reveals that his wife passed away on 08/22/22, marking the end of their 33-year marriage. The patient shares that he lacks local family or contacts for support. Despite asserting no psychiatric history, he acknowledges that his wife had urged him to seek mental health evaluation previously, resulting in a brief stint on medications that he discontinued due to adverse effects. The patient mentions never having seen a counselor except for a brief period during marriage counseling with his late wife. Substance use is denied, except for an evening beer. The patient denies current suicidal or homicidal thoughts, as well as auditory and visual hallucinations. He reports satisfactory sleep, averaging 6-7 hours per night, but notes a decreased appetite and recent weight loss.  During the evaluation, the patient's  appearance was casual and appropriate for the environment. His behavior was cooperative. He was able to communicate clearly with speech at a normal rate and volume. His mood was characterized by depression and his affect was congruent with his mood. His thought process was coherent and linear, and his thought content was logical. There were no signs of delusions or indications that he was currently reacting to internal stimuli. The patient denied experiencing auditory or visual hallucinations. He adamantly denies suicidal and homicidal ideations.  Past Psychiatric History: Depression  Risk to Self or Others: Is the patient at risk to self? Yes Has the patient been a risk to self in the past 6 months? No Has the patient been a risk to self within the distant past? No Is the patient a risk to others? No Has the patient been a risk to others in the past 6 months? No Has the patient been a risk to others within the distant past? No  [place] Scale:  [place] ED from 09/24/2022 in [place] DEPT ED from 09/19/2022 in [facility] ED from 07/08/2021 in [facility] [place] No Risk No Risk No Risk       AIMS:  , , ,  ,   ASAM:    Substance Abuse:  Alcohol / Drug Use Pain Medications: Ibuprofen Prescriptions: See MAR Over the Counter: See MAR History of alcohol / drug use?: No history of alcohol / drug abuse Longest period of sobriety (when/how long): N/A Negative Consequences of Use:  (N/A) Withdrawal Symptoms:  (N/A)  Past Medical History:  Past Medical History:  Diagnosis Date   Degenerative joint disease  Diabetes mellitus without complication ([place])    Diverticulitis    Gout    Hypertension    Involuntary commitment 09/25/2022   Irritable bowel syndrome    Sleep apnea     Past Surgical History:  Procedure Laterality Date   BIOPSY  10/06/2020   Procedure: BIOPSY;  Surgeon:  Eloise Harman, DO;  Location: [facility] SUITE;  Service: Endoscopy;;   COLONOSCOPY WITH PROPOFOL N/A 10/06/2020   Colonoscopy with non-bleeding internal hemorrhoids. Many diverticula in entire colon. Localized mild inflammation in sigmoid colon. Colon mucosa with hyperemia, negative inflammation.    compound fx tibia and fibula     WRIST SURGERY Left    Family History:  Family History  Problem Relation Age of Onset   Cancer Mother    Heart disease Father    Colon cancer Neg Hx    Colon polyps Neg Hx     Social History:  Social History   Substance and Sexual Activity  Alcohol Use Yes   Alcohol/week: 1.0 standard drink of alcohol   Types: 1 Cans of beer per week     Social History   Substance and Sexual Activity  Drug Use No    Social History   Socioeconomic History   Marital status: Married    Spouse name: Not on file   Number of children: Not on file   Years of education: Not on file   Highest education level: Not on file  Occupational History   Not on file  Tobacco Use   Smoking status: Never   Smokeless tobacco: Never  Vaping Use   Vaping Use: Never used  Substance and Sexual Activity   Alcohol use: Yes    Alcohol/week: 1.0 standard drink of alcohol    Types: 1 Cans of beer per week   Drug use: No   Sexual activity: Not Currently    Partners: Female  Other Topics Concern   Not on file  Social History Narrative   Grew up in [place]    Moved to [place] for school.   Father was a Risk analyst in the [employer].   Works part-time.   Married for 28 years.    Does not have children.   Has a lot of pets.   Takes care of mules, dogs, pets.   Has a motorcycle.       Social Determinants of Health   Financial Resource Strain: Low Risk  (03/28/2018)   Overall Financial Resource Strain (CARDIA)    Difficulty of Paying Living Expenses: Not hard at all  Food Insecurity: No Food Insecurity (03/28/2018)   Hunger Vital Sign    Worried About Running Out of Food in the Last Year:  Never true    Ran Out of Food in the Last Year: Never true  Transportation Needs: No Transportation Needs (03/28/2018)   PRAPARE - Hydrologist (Medical): No    Lack of Transportation (Non-Medical): No  Physical Activity: Inactive (03/28/2018)   Exercise Vital Sign    Days of Exercise per Week: 0 days    Minutes of Exercise per Session: 0 min  Stress: No Stress Concern Present (03/28/2018)   [place]    Feeling of Stress : Not at all  Social Connections: Moderately Integrated (03/28/2018)   Social Connection and Isolation Panel [NHANES]    Frequency of Communication with Friends and Family: Once a week    Frequency of Social Gatherings with Friends and  Family: Never    Attends Religious Services: More than 4 times per year    Active Member of Clubs or Organizations: Yes    Attends Archivist Meetings: Never    Marital Status: Married   Additional Social History:    Allergies:  No Known Allergies  Labs:  Results for orders placed or performed during the hospital encounter of 09/24/22 (from the past 48 hour(s))  Urine rapid drug screen (hosp performed)     Status: None   Collection Time: 09/24/22 10:20 AM  Result Value Ref Range   Opiates NONE DETECTED NONE DETECTED   Cocaine NONE DETECTED NONE DETECTED   Benzodiazepines NONE DETECTED NONE DETECTED   Amphetamines NONE DETECTED NONE DETECTED   Tetrahydrocannabinol NONE DETECTED NONE DETECTED   Barbiturates NONE DETECTED NONE DETECTED    Comment: (NOTE) DRUG SCREEN FOR MEDICAL PURPOSES ONLY.  IF CONFIRMATION IS NEEDED FOR ANY PURPOSE, NOTIFY LAB WITHIN 5 DAYS.  LOWEST DETECTABLE LIMITS FOR URINE DRUG SCREEN Drug Class                     Cutoff (ng/mL) Amphetamine and metabolites    1000 Barbiturate and metabolites    200 Benzodiazepine                 200 Opiates and metabolites        300 Cocaine and metabolites         300 THC                            50 Performed at [facility], [place] [address], [place], [place] [address]   Urinalysis, Routine w reflex microscopic Urine, Clean Catch     Status: Abnormal   Collection Time: 09/24/22 10:20 AM  Result Value Ref Range   Color, Urine YELLOW YELLOW   APPearance CLEAR CLEAR   Specific Gravity, Urine 1.020 1.005 - 1.030   pH 5.5 5.0 - 8.0   Glucose, UA >=500 (A) NEGATIVE mg/dL   Hgb urine dipstick NEGATIVE NEGATIVE   Bilirubin Urine NEGATIVE NEGATIVE   Ketones, ur NEGATIVE NEGATIVE mg/dL   Protein, ur NEGATIVE NEGATIVE mg/dL   Nitrite NEGATIVE NEGATIVE   Leukocytes,Ua NEGATIVE NEGATIVE    Comment: Performed at [place] [address], [place], [place] [address]  Urinalysis, Microscopic (reflex)     Status: Abnormal   Collection Time: 09/24/22 10:20 AM  Result Value Ref Range   RBC / HPF 0-5 0 - 5 RBC/hpf   WBC, UA 0-5 0 - 5 WBC/hpf   Bacteria, UA RARE (A) NONE SEEN   Squamous Epithelial / LPF NONE SEEN 0 - 5   Mucus PRESENT    Hyphae Yeast PRESENT     Comment: Performed at [facility], [place] [address] [place], [place] [address]  Comprehensive metabolic panel     Status: Abnormal   Collection Time: 09/24/22  5:47 PM  Result Value Ref Range   Sodium 131 (L) 135 - 145 mmol/L   Potassium 3.4 (L) 3.5 - 5.1 mmol/L   Chloride 96 (L) 98 - 111 mmol/L   CO2 19 (L) 22 - 32 mmol/L   Glucose, Bld 251 (H) 70 - 99 mg/dL    Comment: Glucose reference range applies only to samples taken after fasting for at least 8 hours.   BUN 34 (H) 8 - 23 mg/dL   Creatinine, Ser 1.53 (H)  0.61 - 1.24 mg/dL   Calcium 9.6 8.9 - 10.3 mg/dL   Total Protein 8.0 6.5 - 8.1 g/dL   Albumin 4.6 3.5 - 5.0 g/dL   AST 31 15 - 41 U/L   ALT 38 0 - 44 U/L   Alkaline Phosphatase 66 38 - 126 U/L   Total Bilirubin 1.4 (H) 0.3 - 1.2 mg/dL   GFR, Estimated 50 (L) >60 mL/min    Comment: (NOTE) Calculated using the CKD-EPI  Creatinine Equation (2021)    Anion gap 16 (H) 5 - 15    Comment: Performed at [facility], [place] [address], [place], [place] [address]  Ethanol     Status: None   Collection Time: 09/24/22  5:47 PM  Result Value Ref Range   Alcohol, Ethyl (B) <10 <10 mg/dL    Comment: (NOTE) Lowest detectable limit for serum alcohol is 10 mg/dL.  For medical purposes only. Performed at [facility], [place] [address] [place], [place] [address]   CBC with Diff     Status: Abnormal   Collection Time: 09/24/22  5:47 PM  Result Value Ref Range   WBC 11.4 (H) 4.0 - 10.5 K/uL   RBC 5.36 4.22 - 5.81 MIL/uL   Hemoglobin 17.3 (H) 13.0 - 17.0 g/dL   HCT 48.7 39.0 - 52.0 %   MCV 90.9 80.0 - 100.0 fL   MCH 32.3 26.0 - 34.0 pg   MCHC 35.5 30.0 - 36.0 g/dL   RDW 11.9 11.5 - 15.5 %   Platelets 178 150 - 400 K/uL   nRBC 0.0 0.0 - 0.2 %   Neutrophils Relative % 75 %   Neutro Abs 8.6 (H) 1.7 - 7.7 K/uL   Lymphocytes Relative 18 %   Lymphs Abs 2.1 0.7 - 4.0 K/uL   Monocytes Relative 6 %   Monocytes Absolute 0.6 0.1 - 1.0 K/uL   Eosinophils Relative 0 %   Eosinophils Absolute 0.0 0.0 - 0.5 K/uL   Basophils Relative 1 %   Basophils Absolute 0.1 0.0 - 0.1 K/uL   Immature Granulocytes 0 %   Abs Immature Granulocytes 0.04 0.00 - 0.07 K/uL    Comment: Performed at [facility], [place] [address], [place], [place] [address]  POC CBG, ED     Status: Abnormal   Collection Time: 09/24/22  5:47 PM  Result Value Ref Range   Glucose-Capillary 276 (H) 70 - 99 mg/dL    Comment: Glucose reference range applies only to samples taken after fasting for at least 8 hours.    Current Facility-Administered Medications  Medication Dose Route Frequency Provider Last Rate Last Admin   buPROPion (WELLBUTRIN XL) 24 hr tablet 150 mg  150 mg Oral q morning Blanchie Dessert, MD   150 mg at 09/25/22 1019   dapagliflozin propanediol (FARXIGA) tablet 10 mg  10 mg Oral Daily  Plunkett, Loree Fee, MD       lisinopril-hydrochlorothiazide (ZESTORETIC) 20-12.5 MG per tablet 1 tablet  1 tablet Oral Daily Blanchie Dessert, MD       metFORMIN (GLUCOPHAGE-XR) 24 hr tablet 500 mg  500 mg Oral BID WC Blanchie Dessert, MD       Current Outpatient Medications  Medication Sig Dispense Refill   atorvastatin (LIPITOR) 20 MG tablet TAKE 1 TABLET BY MOUTH ONCE DAILY 30 tablet 0   atorvastatin (LIPITOR) 20 MG tablet Take 1 tablet (20 mg total) by mouth daily. 90 tablet 1   buPROPion (WELLBUTRIN XL) 150 MG  24 hr tablet Take 150 mg by mouth every morning.     buPROPion (WELLBUTRIN XL) 150 MG 24 hr tablet TAKE 1 TABLET BY MOUTH ONCE DAILY EVERY MORNING 90 tablet 1   buPROPion (WELLBUTRIN XL) 150 MG 24 hr tablet TAKE 1 TABLET BY MOUTH ONCE DAILY EVERY MORNING 30 tablet 1   dapagliflozin propanediol (FARXIGA) 10 MG TABS tablet Take 1 tablet (10 mg total) by mouth daily. 90 tablet 0   finasteride (PROPECIA) 1 MG tablet Take 1 tablet (1 mg total) by mouth daily. 90 tablet 1   lisinopril-hydrochlorothiazide (ZESTORETIC) 20-12.5 MG tablet Take 1 tablet by mouth daily. 30 tablet 2   metFORMIN (GLUCOPHAGE-XR) 500 MG 24 hr tablet Take 1 tablet (500 mg total) by mouth every evening with a meal for 7 days, THEN 2 tablets (1,000 mg total) 2 (two) times daily. 106 tablet 0   sildenafil (REVATIO) 20 MG tablet Take 2-5 tablets (40-100 mg total) by mouth 30 minutes before needed. 30 tablet 0    Musculoskeletal: Strength & Muscle Tone: within normal limits Gait & Station: unsteady Patient leans: N/A   Psychiatric Specialty Exam: Presentation  General Appearance:  Casual  Eye Contact: Fair  Speech: Clear and Coherent; Normal Rate  Speech Volume: Decreased  Handedness: Right   Mood and Affect  Mood: Depressed  Affect: Congruent   Thought Process  Thought Processes: Coherent; Linear  Descriptions of Associations:Intact  Orientation:Full (Time, Place and Person)  Thought  Content:Logical  History of Schizophrenia/Schizoaffective disorder:No  Duration of Psychotic Symptoms:No data recorded Hallucinations:Hallucinations: None  Ideas of Reference:None  Suicidal Thoughts:Suicidal Thoughts: No  Homicidal Thoughts:Homicidal Thoughts: No   Sensorium  Memory: Immediate Good; Recent Good; Remote Good  Judgment: Impaired  Insight: Lacking   Executive Functions  Concentration: Fair  Attention Span: Fair  Recall: Good  Fund of Knowledge: Good  Language: Good   Psychomotor Activity  Psychomotor Activity: Psychomotor Activity: Restlessness   Assets  Assets: Communication Skills; Desire for Improvement; Financial Resources/Insurance; Housing    Sleep  Sleep: Sleep: Fair   Physical Exam: Physical Exam Constitutional:      General: He is not in acute distress.    Appearance: He is not ill-appearing, toxic-appearing or diaphoretic.  Eyes:     General:        Right eye: No discharge.        Left eye: No discharge.  Cardiovascular:     Rate and Rhythm: Normal rate.  Pulmonary:     Effort: Pulmonary effort is normal. No respiratory distress.  Musculoskeletal:     Cervical back: Normal range of motion.  Neurological:     Mental Status: He is alert and oriented to person, place, and time.  Psychiatric:        Behavior: Behavior is cooperative.        Thought Content: Thought content is not paranoid. Thought content does not include homicidal or suicidal ideation.    Review of Systems  Constitutional:  Positive for weight loss.  Respiratory:  Negative for cough and shortness of breath.   Cardiovascular:  Negative for chest pain.  Gastrointestinal:  Negative for diarrhea, nausea and vomiting.  Psychiatric/Behavioral:  Positive for depression. Negative for hallucinations and substance abuse. The patient has insomnia. The patient is not nervous/anxious.     Blood pressure (!) 140/86, pulse 69, temperature 98 F (36.7 C),  temperature source Oral, resp. rate 18, height 6' (1.829 m), weight 81.6 kg, SpO2 95 %. Body mass index is 24.41 kg/m.  Medical Decision Making: Patient is a 66 year old male with a history of IBS, hypertension, diabetes, degenerative joint disease who presented to [facility] under IVC from his PCPs office due to making suicidal statements.   Considering the patient acknowledges having guns at home; reported suicidal statements to PCP; and risk factors (recent loss of wife of 8 years, lives alone, no children, medical issues), inpatient psychiatric treatment is recommended.    Disposition: Recommend psychiatric Inpatient admission when medically cleared.  Rozetta Nunnery, NP 09/25/2022 12:16 PM

## 2022-09-25 NOTE — Progress Notes (Signed)
CSW spoke with [facility], advised the patient is under further review. Dorian Pod will reach out to treatment for additional information.  Glennie Isle, MSW, [facility] Phone: [phone] Disposition/TOC

## 2022-09-25 NOTE — Progress Notes (Signed)
Per Lindon Romp, NP, patient meets criteria for inpatient treatment. There are no available beds at [facility] today. CSW faxed referrals to the following facilities for review:  [facility] [address] [place] [address] [phone] --  [facility] N[facility]A [address], [place] [address] [phone] --  [facility]  Pending - Request Sent N/A [place], [place] [address] [phone] --  [facility] [address] [place], [place] [address] [phone] --  [place] [address] [place] [address] [phone] --  [facility]  Pending - Request Sent N/A [address] [place] [address] [place] [address] [place] [address] [phone] --  [facility]  Pending - Request Sent N/A [address] [place] [address] [phone] --  [facility]  Pending - Request Sent N/A [address] [place] [address] [phone] --  [facility]  Pending - Request Sent N/A [place], [place] [address] [phone] --  [facility]  Pending - Request Sent N/A [address], [place] [address] [phone] --  [facility]  Pending - Request Sent N/A [address] [place] [address] [phone] --  [facility]  Pending - Request Sent N/A [address] [place] [address] [phone] --   TTS will continue to seek bed placement.  Glennie Isle, MSW,  [facility] Phone: [phone] Disposition/TOC

## 2022-09-25 NOTE — ED Provider Notes (Signed)
Emergency Medicine Observation Re-evaluation Note  Craig Wagner is a 66 y.o. male, seen on rounds today.  Pt initially presented to the ED for complaints of Suicidal Currently, the patient is pending inpatient placement.  Physical Exam  BP (!) 159/99 (BP Location: Left Arm)   Pulse 71   Temp 98.2 F (36.8 C) (Oral)   Resp 20   Ht 6' (1.829 m)   Wt 81.6 kg   SpO2 96%   BMI 24.41 kg/m  Physical Exam General: Calm Cardiac: Well perfused.  Lungs: Even respirations Psych: Calm  ED Course / MDM  EKG:EKG Interpretation  Date/Time:  Friday September 24 2022 17:15:35 EST Ventricular Rate:  93 PR Interval:  161 QRS Duration: 96 QT Interval:  381 QTC Calculation: 474 R Axis:   83 Text Interpretation: Sinus rhythm Borderline right axis deviation Nonspecific T abnormalities, lateral leads No previous tracing Confirmed by Blanchie Dessert [phone]) on 09/24/2022 7:44:43 PM  I have reviewed the labs performed to date as well as medications administered while in observation.  Recent changes in the last 24 hours include inpatient placement pending. CBG elevated. Will need to be reduced for placement. Will place patient on sliding scale for now.  Plan  Current plan is for glucose control and psych admit.    Margette Fast, MD 09/30/22 [phone]

## 2022-09-25 NOTE — Progress Notes (Signed)
CSW spoke with [facility] admission. It was reported that the patient is under further review. The HMP was requested. CSW faxed [place].  Glennie Isle, MSW, [facility] Phone: [phone] Disposition/TOC

## 2022-09-25 NOTE — ED Notes (Signed)
Pt refused knee brace x 2. Ambulated to restroom with front wheeled walker and lost balance twice; 1+ staff assisted pt in regaining balance without sustaining a fall. Pt refused wheelchair and continued with FWW to restroom. Staff then removed FWW and returned pt to room via wheelchair.

## 2022-09-25 NOTE — ED Notes (Signed)
Pt's belongings moved to locker for rooms 23-25.

## 2022-09-25 NOTE — ED Notes (Signed)
Patient asked to see his phone so that he can get some numbers to call family members.

## 2022-09-26 DIAGNOSIS — F332 Major depressive disorder, recurrent severe without psychotic features: Secondary | ICD-10-CM

## 2022-09-26 LAB — CBG MONITORING, ED
Glucose-Capillary: 178 mg/dL — ABNORMAL HIGH (ref 70–99)
Glucose-Capillary: 175 mg/dL — ABNORMAL HIGH (ref 70–99)
Glucose-Capillary: 303 mg/dL — ABNORMAL HIGH (ref 70–99)
Glucose-Capillary: 292 mg/dL — ABNORMAL HIGH (ref 70–99)

## 2022-09-26 NOTE — ED Notes (Signed)
Patient has no complaints or concerns at this time.

## 2022-09-26 NOTE — Progress Notes (Signed)
[facility] ED Progress Note  09/26/2022 12:08 PM Craig Wagner  MRN:  [id number]   Principal Problem: MDD (major depressive disorder), recurrent episode, severe ([place]) Diagnosis:  Principal Problem:   MDD (major depressive disorder), recurrent episode, severe ([place]) Active Problems:   Involuntary commitment   ED Assessment Time Calculation: Start Time: 6761 Stop Time: 1200 Total Time in Minutes (Assessment Completion): 15  Subjective:  Craig Wagner is a 66 y.o. male patient admitted with a history of IBS, hypertension, diabetes, degenerative joint disease who is presented to [facility] under IVC from his PCPs office.   HPI: The patient is found lying in bed comfortably today. He reiterates his eagerness to return home to his three dogs. He denies any thoughts of self-harm or harm to others. There are no reported auditory or visual hallucinations. The patient also denies experiencing feelings of hopelessness, helplessness, worthlessness, or other symptoms of depression. He reports satisfactory sleep and appetite.  During the evaluation, the patient's appearance was casual and appropriate for the environment. His behavior was cooperative. He was able to communicate clearly with speech at a normal rate and volume. His mood was reported as euthymic however, his affect was non-congruent, constricted and blunt. His thought process was coherent and linear, and his thought content was logical. There were no signs of delusions or indications that he was currently reacting to internal stimuli. The patient denied experiencing auditory or visual hallucinations. He adamantly denies suicidal and homicidal ideations.   [place] Scale:  [place] ED from 09/24/2022 in [place] DEPT ED from 09/19/2022 in [facility] ED from 07/08/2021 in [facility] [place] No Risk No Risk No Risk       Past Medical History:   Past Medical History:  Diagnosis Date   Degenerative joint disease    Diabetes mellitus without complication ([place])    Diverticulitis    Gout    Hypertension    Involuntary commitment 09/25/2022   Irritable bowel syndrome    Sleep apnea     Past Surgical History:  Procedure Laterality Date   BIOPSY  10/06/2020   Procedure: BIOPSY;  Surgeon: Eloise Harman, DO;  Location: [facility] ENDO SUITE;  Service: Endoscopy;;   COLONOSCOPY WITH PROPOFOL N/A 10/06/2020   Colonoscopy with non-bleeding internal hemorrhoids. Many diverticula in entire colon. Localized mild inflammation in sigmoid colon. Colon mucosa with hyperemia, negative inflammation.    compound fx tibia and fibula     WRIST SURGERY Left    Family History:  Family History  Problem Relation Age of Onset   Cancer Mother    Heart disease Father    Colon cancer Neg Hx    Colon polyps Neg Hx     Social History:  Social History   Substance and Sexual Activity  Alcohol Use Yes   Alcohol/week: 1.0 standard drink of alcohol   Types: 1 Cans of beer per week     Social History   Substance and Sexual Activity  Drug Use No    Social History   Socioeconomic History   Marital status: Married    Spouse name: Not on file   Number of children: Not on file   Years of education: Not on file   Highest education level: Not on file  Occupational History   Not on file  Tobacco Use   Smoking status: Never   Smokeless tobacco: Never  Vaping Use   Vaping Use: Never  used  Substance and Sexual Activity   Alcohol use: Yes    Alcohol/week: 1.0 standard drink of alcohol    Types: 1 Cans of beer per week   Drug use: No   Sexual activity: Not Currently    Partners: Female  Other Topics Concern   Not on file  Social History Narrative   Grew up in [place]    Moved to [place] for school.   Father was a Risk analyst in the [employer].   Works part-time.   Married for 28 years.    Does not have children.   Has a lot of pets.   Takes care of  mules, dogs, pets.   Has a motorcycle.       Social Determinants of Health   Financial Resource Strain: Low Risk  (03/28/2018)   Overall Financial Resource Strain (CARDIA)    Difficulty of Paying Living Expenses: Not hard at all  Food Insecurity: No Food Insecurity (03/28/2018)   Hunger Vital Sign    Worried About Running Out of Food in the Last Year: Never true    Ran Out of Food in the Last Year: Never true  Transportation Needs: No Transportation Needs (03/28/2018)   PRAPARE - Hydrologist (Medical): No    Lack of Transportation (Non-Medical): No  Physical Activity: Inactive (03/28/2018)   Exercise Vital Sign    Days of Exercise per Week: 0 days    Minutes of Exercise per Session: 0 min  Stress: No Stress Concern Present (03/28/2018)   [place]    Feeling of Stress : Not at all  Social Connections: Moderately Integrated (03/28/2018)   Social Connection and Isolation Panel [NHANES]    Frequency of Communication with Friends and Family: Once a week    Frequency of Social Gatherings with Friends and Family: Never    Attends Religious Services: More than 4 times per year    Active Member of [employer] or Organizations: Yes    Attends Archivist Meetings: Never    Marital Status: Married    Sleep: Good  Appetite:  Good  Current Medications: Current Facility-Administered Medications  Medication Dose Route Frequency Provider Last Rate Last Admin   buPROPion (WELLBUTRIN XL) 24 hr tablet 150 mg  150 mg Oral q morning Maryan Rued, Whitney, MD   150 mg at 09/26/22 0950   dapagliflozin propanediol (FARXIGA) tablet 10 mg  10 mg Oral Daily Blanchie Dessert, MD   10 mg at 09/26/22 0951   lisinopril (ZESTRIL) tablet 20 mg  20 mg Oral Daily Blanchie Dessert, MD   20 mg at 09/26/22 0950   And   hydrochlorothiazide (HYDRODIURIL) tablet 12.5 mg  12.5 mg Oral Daily Plunkett, Whitney, MD   12.5 mg at  09/26/22 0949   insulin aspart (novoLOG) injection 0-9 Units  0-9 Units Subcutaneous TID WC Long, Wonda Olds, MD   2 Units at 09/26/22 0911   metFORMIN (GLUCOPHAGE-XR) 24 hr tablet 500 mg  500 mg Oral BID WC Blanchie Dessert, MD   500 mg at 09/26/22 8270   Current Outpatient Medications  Medication Sig Dispense Refill   ibuprofen (ADVIL) 200 MG tablet Take 200-400 mg by mouth every 6 (six) hours as needed for mild pain or headache.     atorvastatin (LIPITOR) 20 MG tablet TAKE 1 TABLET BY MOUTH ONCE DAILY (Patient not taking: Reported on 09/25/2022) 30 tablet 0   atorvastatin (LIPITOR) 20 MG tablet Take 1 tablet (  20 mg total) by mouth daily. (Patient not taking: Reported on 09/25/2022) 90 tablet 1   buPROPion (WELLBUTRIN XL) 150 MG 24 hr tablet TAKE 1 TABLET BY MOUTH ONCE DAILY EVERY MORNING (Patient not taking: Reported on 09/25/2022) 90 tablet 1   buPROPion (WELLBUTRIN XL) 150 MG 24 hr tablet TAKE 1 TABLET BY MOUTH ONCE DAILY EVERY MORNING (Patient not taking: Reported on 09/25/2022) 30 tablet 1   dapagliflozin propanediol (FARXIGA) 10 MG TABS tablet Take 1 tablet (10 mg total) by mouth daily. (Patient not taking: Reported on 09/25/2022) 90 tablet 0   finasteride (PROPECIA) 1 MG tablet Take 1 tablet (1 mg total) by mouth daily. (Patient not taking: Reported on 09/25/2022) 90 tablet 1   lisinopril-hydrochlorothiazide (ZESTORETIC) 20-12.5 MG tablet Take 1 tablet by mouth daily. (Patient not taking: Reported on 09/25/2022) 30 tablet 2   metFORMIN (GLUCOPHAGE-XR) 500 MG 24 hr tablet Take 1 tablet (500 mg total) by mouth every evening with a meal for 7 days, THEN 2 tablets (1,000 mg total) 2 (two) times daily. (Patient not taking: Reported on 09/25/2022) 106 tablet 0   sildenafil (REVATIO) 20 MG tablet Take 2-5 tablets (40-100 mg total) by mouth 30 minutes before needed. (Patient not taking: Reported on 09/25/2022) 30 tablet 0    Lab Results:  Results for orders placed or performed during the hospital encounter  of 09/24/22 (from the past 48 hour(s))  Comprehensive metabolic panel     Status: Abnormal   Collection Time: 09/24/22  5:47 PM  Result Value Ref Range   Sodium 131 (L) 135 - 145 mmol/L   Potassium 3.4 (L) 3.5 - 5.1 mmol/L   Chloride 96 (L) 98 - 111 mmol/L   CO2 19 (L) 22 - 32 mmol/L   Glucose, Bld 251 (H) 70 - 99 mg/dL    Comment: Glucose reference range applies only to samples taken after fasting for at least 8 hours.   BUN 34 (H) 8 - 23 mg/dL   Creatinine, Ser 1.53 (H) 0.61 - 1.24 mg/dL   Calcium 9.6 8.9 - 10.3 mg/dL   Total Protein 8.0 6.5 - 8.1 g/dL   Albumin 4.6 3.5 - 5.0 g/dL   AST 31 15 - 41 U/L   ALT 38 0 - 44 U/L   Alkaline Phosphatase 66 38 - 126 U/L   Total Bilirubin 1.4 (H) 0.3 - 1.2 mg/dL   GFR, Estimated 50 (L) >60 mL/min    Comment: (NOTE) Calculated using the CKD-EPI Creatinine Equation (2021)    Anion gap 16 (H) 5 - 15    Comment: Performed at [facility], [place] [address] [place], [place] [address]  Ethanol     Status: None   Collection Time: 09/24/22  5:47 PM  Result Value Ref Range   Alcohol, Ethyl (B) <10 <10 mg/dL    Comment: (NOTE) Lowest detectable limit for serum alcohol is 10 mg/dL.  For medical purposes only. Performed at [facility], [place] [address] [place], [place] [address]   CBC with Diff     Status: Abnormal   Collection Time: 09/24/22  5:47 PM  Result Value Ref Range   WBC 11.4 (H) 4.0 - 10.5 K/uL   RBC 5.36 4.22 - 5.81 MIL/uL   Hemoglobin 17.3 (H) 13.0 - 17.0 g/dL   HCT 48.7 39.0 - 52.0 %   MCV 90.9 80.0 - 100.0 fL   MCH 32.3 26.0 - 34.0 pg   MCHC 35.5 30.0 - 36.0 g/dL  RDW 11.9 11.5 - 15.5 %   Platelets 178 150 - 400 K/uL   nRBC 0.0 0.0 - 0.2 %   Neutrophils Relative % 75 %   Neutro Abs 8.6 (H) 1.7 - 7.7 K/uL   Lymphocytes Relative 18 %   Lymphs Abs 2.1 0.7 - 4.0 K/uL   Monocytes Relative 6 %   Monocytes Absolute 0.6 0.1 - 1.0 K/uL   Eosinophils Relative 0 %   Eosinophils Absolute 0.0  0.0 - 0.5 K/uL   Basophils Relative 1 %   Basophils Absolute 0.1 0.0 - 0.1 K/uL   Immature Granulocytes 0 %   Abs Immature Granulocytes 0.04 0.00 - 0.07 K/uL    Comment: Performed at [facility], [place] [address] [place], [place] [address]  POC CBG, ED     Status: Abnormal   Collection Time: 09/24/22  5:47 PM  Result Value Ref Range   Glucose-Capillary 276 (H) 70 - 99 mg/dL    Comment: Glucose reference range applies only to samples taken after fasting for at least 8 hours.  SARS Coronavirus 2 by RT PCR (hospital order, performed in [facility] hospital lab) *cepheid single result test* Anterior Nasal Swab     Status: None   Collection Time: 09/25/22  1:23 PM   Specimen: Anterior Nasal Swab  Result Value Ref Range   SARS Coronavirus 2 by RT PCR NEGATIVE NEGATIVE    Comment: (NOTE) SARS-CoV-2 target nucleic acids are NOT DETECTED.  The SARS-CoV-2 RNA is generally detectable in upper and lower respiratory specimens during the acute phase of infection. The lowest concentration of SARS-CoV-2 viral copies this assay can detect is 250 copies / mL. A negative result does not preclude SARS-CoV-2 infection and should not be used as the sole basis for treatment or other patient management decisions.  A negative result may occur with improper specimen collection / handling, submission of specimen other than nasopharyngeal swab, presence of viral mutation(s) within the areas targeted by this assay, and inadequate number of viral copies (<250 copies / mL). A negative result must be combined with clinical observations, patient history, and epidemiological information.  Fact Sheet for Patients:   https://www.patel.info/  Fact Sheet for Healthcare Providers: https://hall.com/  This test is not yet approved or  cleared by the [place] FDA and has been authorized for detection and/or diagnosis of SARS-CoV-2 by FDA under an  Emergency Use Authorization (EUA).  This EUA will remain in effect (meaning this test can be used) for the duration of the COVID-19 declaration under Section 564(b)(1) of the Act, 21 U.S.C. section 360bbb-3(b)(1), unless the authorization is terminated or revoked sooner.  Performed at [facility], [place] [address] [place],  [address]   CBG monitoring, ED     Status: Abnormal   Collection Time: 09/25/22  5:11 PM  Result Value Ref Range   Glucose-Capillary 303 (H) 70 - 99 mg/dL    Comment: Glucose reference range applies only to samples taken after fasting for at least 8 hours.  CBG monitoring, ED     Status: Abnormal   Collection Time: 09/26/22  6:25 AM  Result Value Ref Range   Glucose-Capillary 178 (H) 70 - 99 mg/dL    Comment: Glucose reference range applies only to samples taken after fasting for at least 8 hours.  CBG monitoring, ED     Status: Abnormal   Collection Time: 09/26/22  8:00 AM  Result Value Ref Range   Glucose-Capillary 175 (H) 70 -  99 mg/dL    Comment: Glucose reference range applies only to samples taken after fasting for at least 8 hours.    Blood Alcohol level:  Lab Results  Component Value Date   ETH <10 09/24/2022    Physical Findings:  CIWA:    COWS:     Musculoskeletal: Strength & Muscle Tone: within normal limits Gait & Station: unsteady Patient leans: N/A  Psychiatric Specialty Exam:  Presentation  General Appearance:  Casual  Eye Contact: Fair  Speech: Clear and Coherent; Normal Rate  Speech Volume: Decreased  Handedness: Right   Mood and Affect  Mood: Euthymic  Affect: Non-Congruent; Constricted; Blunt   Thought Process  Thought Processes: Coherent; Linear  Descriptions of Associations:Intact  Orientation:Full (Time, Place and Person)  Thought Content:Logical  History of Schizophrenia/Schizoaffective disorder:No  Duration of Psychotic Symptoms:No data  recorded Hallucinations:Hallucinations: None  Ideas of Reference:None  Suicidal Thoughts:Suicidal Thoughts: No  Homicidal Thoughts:Homicidal Thoughts: No   Sensorium  Memory: Immediate Good; Recent Good; Remote Good  Judgment: Intact  Insight: Lacking   Executive Functions  Concentration: Fair  Attention Span: Fair  Recall: Good  Fund of Knowledge: Good  Language: Good   Psychomotor Activity  Psychomotor Activity: Psychomotor Activity: Normal   Assets  Assets: Communication Skills; Desire for Improvement; Financial Resources/Insurance; Housing   Sleep  Sleep: Sleep: Good    Physical Exam: Physical Exam Vitals and nursing note reviewed.  Constitutional:      General: He is not in acute distress.    Appearance: He is not ill-appearing, toxic-appearing or diaphoretic.  Eyes:     General:        Right eye: No discharge.        Left eye: No discharge.  Cardiovascular:     Rate and Rhythm: Normal rate.  Pulmonary:     Effort: Pulmonary effort is normal. No respiratory distress.  Musculoskeletal:     Cervical back: Normal range of motion.  Neurological:     Mental Status: He is alert and oriented to person, place, and time.    Review of Systems  Respiratory:  Negative for cough and shortness of breath.   Cardiovascular:  Negative for chest pain.  Gastrointestinal:  Negative for diarrhea, nausea and vomiting.    Blood pressure (!) 142/96, pulse 70, temperature 98.5 F (36.9 C), temperature source Oral, resp. rate 16, height 6' (1.829 m), weight 81.6 kg, SpO2 99 %. Body mass index is 24.41 kg/m.   Medical Decision Making: Patient is a 66 year old male with a history of IBS, hypertension, diabetes, degenerative joint disease who presented to [facility] under IVC from his PCPs office due to making suicidal statements.    Considering the patient acknowledges having guns at home; reported suicidal statements to PCP; and risk factors (recent loss of  wife of 31 years, lives alone, no children, medical issues), inpatient psychiatric treatment is recommended.      Disposition: Recommend psychiatric Inpatient admission when medically cleared.  Rozetta Nunnery, NP 09/26/2022, 12:08 PM

## 2022-09-26 NOTE — ED Provider Notes (Signed)
Emergency Medicine Observation Re-evaluation Note  Craig Wagner is a 66 y.o. male, seen on rounds today.  Pt initially presented to the ED for complaints of Suicidal Currently, the patient is resting.  No acute distress.  Physical Exam  BP (!) 142/96 (BP Location: Right Arm)   Pulse 70   Temp 98.5 F (36.9 C) (Oral)   Resp 16   Ht 1.829 m (6')   Wt 81.6 kg   SpO2 99%   BMI 24.41 kg/m  Physical Exam   ED Course / MDM  EKG:EKG Interpretation  Date/Time:  Friday September 24 2022 17:15:35 EST Ventricular Rate:  93 PR Interval:  161 QRS Duration: 96 QT Interval:  381 QTC Calculation: 474 R Axis:   83 Text Interpretation: Sinus rhythm Borderline right axis deviation Nonspecific T abnormalities, lateral leads No previous tracing Confirmed by Blanchie Dessert [phone]) on 09/24/2022 7:44:43 PM  I have reviewed the labs performed to date as well as medications administered while in observation.  Recent changes in the last 24 hours include none.  Plan  Current plan is for placement.    Lacretia Leigh, MD 09/26/22 [phone]

## 2022-09-26 NOTE — ED Notes (Signed)
Patient to room 28. Patient alert and cooperative.  Patient was able to move from stretcher to bed. Patient oriented to unit and room. Sitter at bedside.

## 2022-09-27 LAB — CBG MONITORING, ED
Glucose-Capillary: 233 mg/dL — ABNORMAL HIGH (ref 70–99)
Glucose-Capillary: 279 mg/dL — ABNORMAL HIGH (ref 70–99)
Glucose-Capillary: 221 mg/dL — ABNORMAL HIGH (ref 70–99)

## 2022-09-27 LAB — RESP PANEL BY RT-PCR (FLU A&B, COVID) ARPGX2
Influenza A by PCR: NEGATIVE
Influenza B by PCR: NEGATIVE
SARS Coronavirus 2 by RT PCR: NEGATIVE

## 2022-09-27 NOTE — Progress Notes (Signed)
CSW sent request to [facility] for pt to be reviewed for [facility] placement. Per Lindon Romp, NP, pt meets inpatient criteria.    Care Team Notified: Caren Griffins, DO, Alethia Berthold, MD, Maxie Better, RN, Amy Boisvert, RN, and [address] LCSWA  [place], [place]  09/27/2022 10:41 AM

## 2022-09-27 NOTE — Consult Note (Signed)
Patient is admitted to [facility] and is waiting for transportation.  No behavior issues noted while waiting.

## 2022-09-27 NOTE — Progress Notes (Addendum)
Pt was accepted to [facility] 09/27/22 pending negative covid; Bed Assignment: L28  Pt meets inpatient criteria per Charmaine Downs, NP  Attending Physician will be Caren Griffins, DO  Report can be called to: - Child and Adolescence unit: -Adult unit: [phone]  Pt can arrive after: [place] with coordinate with care team.  Care Team Notified: Caren Griffins, DO, Alethia Berthold, MD, Maxie Better, RN, Amy Boisvert, RN, Charmaine Downs, NP, Edward Qualia, RN, and Shelton Silvas, LCSWA  [place], [place]  09/27/2022 1:12 PM

## 2022-09-27 NOTE — Progress Notes (Addendum)
Per Caren Griffins, DO, pt has been accepted to [facility] 09/27/22. CSW awaiting bed assignment. CSW will assist and follow with placement.  Care Team Notified: Caren Griffins, DO, Alethia Berthold, MD, Maxie Better, RN, Amy Boisvert, RN, and [address], LCSWA  [place], [place]  09/27/2022 12:39 PM

## 2022-09-27 NOTE — ED Notes (Signed)
Good phone call with family member Inez Catalina current behavior self controlled and requires minimal interaction by staff. Pt is alert x4 affect is flat mood stable .

## 2022-09-27 NOTE — Inpatient Diabetes Management (Signed)
Inpatient Diabetes Program Recommendations  AACE/ADA: New Consensus Statement on Inpatient Glycemic Control (2015)  Target Ranges:  Prepandial:   less than 140 mg/dL      Peak postprandial:   less than 180 mg/dL (1-2 hours)      Critically ill patients:  140 - 180 mg/dL   Lab Results  Component Value Date   GLUCAP 279 (H) 09/27/2022   HGBA1C 6.5 (H) 07/06/2018    Review of Glycemic Control  Latest Reference Range & Units 09/26/22 08:00 09/26/22 12:27 09/26/22 16:58 09/27/22 08:15 09/27/22 13:13  Glucose-Capillary 70 - 99 mg/dL 175 (H) 303 (H) 292 (H) 233 (H) 279 (H)  (H): Data is abnormally high  Diabetes history: DM2 Outpatient Diabetes medications:  Metformin 1000 mg BID (not taking) Farxiga 10 mg QD (not taking) Current orders for Inpatient glycemic control:  Novolog 0-9 units TID  Metformin 500 mg BID Farxiga 10 mg QD  Inpatient Diabetes Program Recommendations:    Please consider:  -Obtaining an A1C -Semglee 12 units QD (81.6kg x 0.15 units)  Will continue to follow while inpatient.  Thank you, Reche Dixon, MSN, [place] Diabetes Coordinator Inpatient Diabetes Program [phone] (team pager from 8a-5p)

## 2022-09-27 NOTE — ED Provider Notes (Signed)
  Physical Exam  BP (!) 121/91 (BP Location: Right Arm)   Pulse 70   Temp 98.2 F (36.8 C) (Oral)   Resp 18   Ht 6' (1.829 m)   Wt 81.6 kg   SpO2 94%   BMI 24.41 kg/m   Physical Exam  Procedures  Procedures  ED Course / MDM    Medical Decision Making Risk OTC drugs. Prescription drug management.   Pending inpatient psychiatric treatment.  Psychiatry following.  However sugars are more improved.  At this point patient is medically cleared.       Davonna Belling, MD 09/27/22 [phone]

## 2022-09-27 NOTE — ED Notes (Signed)
Currently sleeping no signs of distress or disturbed sleep patterns, respirations are easy skin color is appropriate for his ethnicity .

## 2022-09-27 NOTE — BH Assessment (Signed)
Patient is to be admitted to [facility] today 09/27/22 by Dr.  Louis Meckel .  Attending Physician will be Dr.  Louis Meckel .   Patient has been assigned to room 28, by [place] Nurse, Amy.    ER staff is aware of the admission:  Seth Bake, Patient Access.

## 2022-09-28 ENCOUNTER — Encounter: Payer: Self-pay | Admitting: Psychiatry

## 2022-09-28 ENCOUNTER — Inpatient Hospital Stay
Admission: AD | Admit: 2022-09-28 | Discharge: 2022-10-01 | DRG: 885 | Disposition: A | Discharge status: Home / Self Care | Payer: 59 | Source: Intra-hospital | Attending: Psychiatry | Admitting: Psychiatry

## 2022-09-28 ENCOUNTER — Other Ambulatory Visit: Payer: Self-pay

## 2022-09-28 DIAGNOSIS — K5730 Diverticulosis of large intestine without perforation or abscess without bleeding: Secondary | ICD-10-CM | POA: Diagnosis not present

## 2022-09-28 DIAGNOSIS — F332 Major depressive disorder, recurrent severe without psychotic features: Secondary | ICD-10-CM | POA: Diagnosis present

## 2022-09-28 DIAGNOSIS — E119 Type 2 diabetes mellitus without complications: Secondary | ICD-10-CM | POA: Diagnosis present

## 2022-09-28 DIAGNOSIS — F419 Anxiety disorder, unspecified: Secondary | ICD-10-CM | POA: Diagnosis present

## 2022-09-28 DIAGNOSIS — Z634 Disappearance and death of family member: Secondary | ICD-10-CM

## 2022-09-28 DIAGNOSIS — E860 Dehydration: Secondary | ICD-10-CM | POA: Diagnosis not present

## 2022-09-28 DIAGNOSIS — Z20822 Contact with and (suspected) exposure to covid-19: Secondary | ICD-10-CM | POA: Diagnosis not present

## 2022-09-28 DIAGNOSIS — R45851 Suicidal ideations: Secondary | ICD-10-CM | POA: Diagnosis not present

## 2022-09-28 DIAGNOSIS — Z79899 Other long term (current) drug therapy: Secondary | ICD-10-CM

## 2022-09-28 DIAGNOSIS — Z7984 Long term (current) use of oral hypoglycemic drugs: Secondary | ICD-10-CM | POA: Diagnosis not present

## 2022-09-28 DIAGNOSIS — M25461 Effusion, right knee: Secondary | ICD-10-CM | POA: Diagnosis not present

## 2022-09-28 DIAGNOSIS — I10 Essential (primary) hypertension: Secondary | ICD-10-CM | POA: Diagnosis present

## 2022-09-28 DIAGNOSIS — R69 Illness, unspecified: Secondary | ICD-10-CM | POA: Diagnosis not present

## 2022-09-28 DIAGNOSIS — W19XXXA Unspecified fall, initial encounter: Secondary | ICD-10-CM | POA: Diagnosis not present

## 2022-09-28 DIAGNOSIS — S0990XA Unspecified injury of head, initial encounter: Secondary | ICD-10-CM | POA: Diagnosis not present

## 2022-09-28 DIAGNOSIS — M7989 Other specified soft tissue disorders: Secondary | ICD-10-CM | POA: Diagnosis not present

## 2022-09-28 DIAGNOSIS — E1165 Type 2 diabetes mellitus with hyperglycemia: Secondary | ICD-10-CM | POA: Diagnosis not present

## 2022-09-28 LAB — GLUCOSE, CAPILLARY
Glucose-Capillary: 269 mg/dL — ABNORMAL HIGH (ref 70–99)
Glucose-Capillary: 264 mg/dL — ABNORMAL HIGH (ref 70–99)
Glucose-Capillary: 170 mg/dL — ABNORMAL HIGH (ref 70–99)

## 2022-09-28 LAB — CBG MONITORING, ED: Glucose-Capillary: 206 mg/dL — ABNORMAL HIGH (ref 70–99)

## 2022-09-28 MED ORDER — OLANZAPINE 5 MG PO TABS: 10.0000 mg | ORAL_TABLET | Freq: Four times a day (QID) | ORAL | Status: DC | PRN

## 2022-09-28 MED ORDER — ALUM & MAG HYDROXIDE-SIMETH 200-200-20 MG/5ML PO SUSP: 30.0000 mL | ORAL | Status: DC | PRN

## 2022-09-28 MED ORDER — ATORVASTATIN CALCIUM 10 MG PO TABS
20.0000 mg | ORAL_TABLET | Freq: Every day | ORAL | Status: DC
  Administered 2022-09-28 – 2022-10-01 (×4): 20 mg via ORAL
  Filled 2022-09-28 (×4): qty 2

## 2022-09-28 MED ORDER — HYDROCHLOROTHIAZIDE 12.5 MG PO TABS
12.5000 mg | ORAL_TABLET | Freq: Every day | ORAL | Status: DC
  Administered 2022-09-29 – 2022-09-30 (×2): 12.5 mg via ORAL
  Filled 2022-09-28 (×2): qty 1

## 2022-09-28 MED ORDER — DAPAGLIFLOZIN PROPANEDIOL 10 MG PO TABS
10.0000 mg | ORAL_TABLET | Freq: Every day | ORAL | Status: DC
  Administered 2022-09-29 – 2022-10-01 (×3): 10 mg via ORAL
  Filled 2022-09-28 (×3): qty 1

## 2022-09-28 MED ORDER — ACETAMINOPHEN 325 MG PO TABS
650.0000 mg | ORAL_TABLET | Freq: Four times a day (QID) | ORAL | Status: DC | PRN
  Administered 2022-09-29 – 2022-10-01 (×4): 650 mg via ORAL
  Filled 2022-09-28 (×4): qty 2

## 2022-09-28 MED ORDER — LISINOPRIL 20 MG PO TABS
20.0000 mg | ORAL_TABLET | Freq: Every day | ORAL | Status: DC
  Administered 2022-09-29 – 2022-09-30 (×2): 20 mg via ORAL
  Filled 2022-09-28 (×2): qty 1

## 2022-09-28 MED ORDER — BUPROPION HCL ER (XL) 150 MG PO TB24
150.0000 mg | ORAL_TABLET | Freq: Every morning | ORAL | Status: DC
  Administered 2022-09-29 – 2022-10-01 (×2): 150 mg via ORAL
  Filled 2022-09-28 (×3): qty 1

## 2022-09-28 MED ORDER — MAGNESIUM HYDROXIDE 400 MG/5ML PO SUSP: 30.0000 mL | Freq: Every day | ORAL | Status: DC | PRN

## 2022-09-28 MED ORDER — LORAZEPAM 1 MG PO TABS: 1.0000 mg | ORAL_TABLET | ORAL | Status: DC | PRN

## 2022-09-28 MED ORDER — TRAZODONE HCL 100 MG PO TABS
100.0000 mg | ORAL_TABLET | Freq: Every evening | ORAL | Status: DC | PRN
  Administered 2022-09-28: 100 mg via ORAL
  Filled 2022-09-28 (×2): qty 1

## 2022-09-28 MED ORDER — INSULIN ASPART 100 UNIT/ML IJ SOLN
0.0000 [IU] | Freq: Three times a day (TID) | INTRAMUSCULAR | Status: DC
  Administered 2022-09-28 – 2022-09-29 (×3): 5 [IU] via SUBCUTANEOUS
  Administered 2022-09-29: 7 [IU] via SUBCUTANEOUS
  Administered 2022-09-29 – 2022-09-30 (×2): 3 [IU] via SUBCUTANEOUS
  Administered 2022-09-30: 2 [IU] via SUBCUTANEOUS
  Filled 2022-09-28 (×5): qty 1

## 2022-09-28 MED ORDER — METFORMIN HCL ER 500 MG PO TB24
500.0000 mg | ORAL_TABLET | Freq: Two times a day (BID) | ORAL | Status: DC
  Administered 2022-09-28 – 2022-10-01 (×6): 500 mg via ORAL
  Filled 2022-09-28 (×8): qty 1

## 2022-09-28 NOTE — ED Notes (Signed)
Remains asleep no distress or sleep disturbance noted respirations are easy.

## 2022-09-28 NOTE — ED Provider Notes (Signed)
Emergency Medicine Observation Re-evaluation Note  Craig Wagner is a 66 y.o. male, seen on rounds today.  Pt initially presented to the ED for complaints of Suicidal Currently, the patient is sleeping.  Physical Exam  BP 136/86 (BP Location: Right Arm)   Pulse 70   Temp 98 F (36.7 C) (Oral)   Resp 18   Ht 1.829 m (6')   Wt 81.6 kg   SpO2 98%   BMI 24.41 kg/m  Physical Exam General: No acute distress Cardiac: Regular rate Lungs: Normal effort Psych: Deferred  ED Course / MDM  EKG:EKG Interpretation  Date/Time:  Friday September 24 2022 17:15:35 EST Ventricular Rate:  93 PR Interval:  161 QRS Duration: 96 QT Interval:  381 QTC Calculation: 474 R Axis:   83 Text Interpretation: Sinus rhythm Borderline right axis deviation Nonspecific T abnormalities, lateral leads No previous tracing Confirmed by Blanchie Dessert [phone]) on 09/24/2022 7:44:43 PM  I have reviewed the labs performed to date as well as medications administered while in observation.  Recent changes in the last 24 hours include psychiatric consultation..  Plan  Current plan is for inpatient treatment at [facility].    Dorie Rank, MD 09/28/22 [phone]

## 2022-09-28 NOTE — BHH Counselor (Addendum)
Adult Comprehensive Assessment  Patient ID: Craig Wagner, male   DOB: 02-11-1956, 66 y.o.   MRN: [id number]  Information Source: Information source: Patient  Current Stressors:  Patient states their primary concerns and needs for treatment are:: "needed a CT scan for my head" Patient states their goals for this hospitilization and ongoing recovery are:: "getting out of here" Educational / Learning stressors: Pt denies Employment / Job issues: Pt denies Family Relationships: Pt denies Museum/gallery curator / Lack of resources (include bankruptcy): Pt denies Housing / Lack of housing: Pt denies Physical health (include injuries & life threatening diseases): "my knee" Social relationships: Pt denies Substance abuse: "I have a beer once in a while, 2 or 3x's a week" Bereavement / Loss: Pt denies  Living/Environment/Situation:  Living Arrangements: Alone Living conditions (as described by patient or guardian): "with 3 dogs" How long has patient lived in current situation?: 10 years What is atmosphere in current home: Comfortable  Family History:  Marital status: Widowed Widowed, when?: "10/23" Are you sexually active?: No What is your sexual orientation?: unable to assess Has your sexual activity been affected by drugs, alcohol, medication, or emotional stress?: unable to assess Does patient have children?: No  Childhood History:  By whom was/is the patient raised?: Both parents Description of patient's relationship with caregiver when they were a child: "fine" Patient's description of current relationship with people who raised him/her: fine, "just my mom" How were you disciplined when you got in trouble as a child/adolescent?: "no, I was a good kid" Does patient have siblings?: Yes Number of Siblings: 3 (2 brothers, 1 sister) Description of patient's current relationship with siblings: "good relationship" Did patient suffer any verbal/emotional/physical/sexual abuse as a child?: No Did  patient suffer from severe childhood neglect?: No Has patient ever been sexually abused/assaulted/raped as an adolescent or adult?: No Was the patient ever a victim of a crime or a disaster?: No Witnessed domestic violence?: No Has patient been affected by domestic violence as an adult?: No  Education:  Highest grade of school patient has completed: Therapist, nutritional Currently a student?: No Learning disability?: No  Employment/Work Situation:   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 Where was the Patient Employed at that Time?: 27 years Has Patient ever Been in the [employer]?: No  Financial Resources:   Museum/gallery curator resources:  ("can receive social security this month") Does patient have a Programmer, applications or guardian?: No  Alcohol/Substance Abuse:   What has been your use of drugs/alcohol within the last 12 months?: Pt states that he drinks 2 or 3 beers a week If attempted suicide, did drugs/alcohol play a role in this?: No Alcohol/Substance Abuse Treatment Hx: Denies past history Has alcohol/substance abuse ever caused legal problems?: No  Social Support System:   Pensions consultant Support System: Fair Astronomer System: "friends come by when they want" Type of faith/religion: "Christian" How does patient's faith help to cope with current illness?: "some, yeah, asked for the Bible"  Leisure/Recreation:   Do You Have Hobbies?: Yes Leisure and Hobbies: b"uild and mantain the property and work with my dogs"  Strengths/Needs:   What is the patient's perception of their strengths?: "my financial abilities, outgoing nature, friendliness" Patient states they can use these personal strengths during their treatment to contribute to their recovery: pt denies Patient states these barriers may affect/interfere with their treatment: pt denies Patient states these barriers may affect their return to the  community: pt  denies  Discharge Plan: Currently receiving community mental health services: No Patient states concerns and preferences for aftercare planning are: Pt denies interest in referral for follow up Patient states they will know when they are safe and ready for discharge when: 'feel ready now" Does patient have access to transportation?: No (my car is at [place] at [facility]) Does patient have financial barriers related to discharge medications?: No Patient description of barriers related to discharge medications: pt denies Plan for no access to transportation at discharge: CSW will assist pt with obtaining transportation Will patient be returning to same living situation after discharge?: Yes  Summary/Recommendations:   Summary and Recommendations (to be completed by the evaluator): Patient is a 66 year old male, widowed, from  [place], [place] [facility]  He reports that he will receive SSI and is currently retired. He presents to the hospital following reports of suicial ideation with plan reported to his PCP during an appointment. Recent stressos include, severe knee pain, recent death of his wife and lack of social supports. He has a diagnosis of MDD (major depressive disorder), recurrent severe, without psychosis. Patient has no aftercare plan in place and is not interested in referral to community mental health services.  Recommendations include: crisis stabilization, therapeutic milieu, encourage group attendance and participation, medication management for mood stabilization and development of comprehensive mental wellness plan.  Payden Docter A [place]. 09/28/2022

## 2022-09-28 NOTE — Inpatient Diabetes Management (Signed)
Inpatient Diabetes Program Recommendations  AACE/ADA: New Consensus Statement on Inpatient Glycemic Control (2015)  Target Ranges:  Prepandial:   less than 140 mg/dL      Peak postprandial:   less than 180 mg/dL (1-2 hours)      Critically ill patients:  140 - 180 mg/dL   Lab Results  Component Value Date   GLUCAP 269 (H) 09/28/2022   HGBA1C 6.5 (H) 07/06/2018    Review of Glycemic Control  Latest Reference Range & Units 09/27/22 08:15 09/27/22 13:13 09/27/22 17:16 09/28/22 09:13 09/28/22 12:24  Glucose-Capillary 70 - 99 mg/dL 233 (H) 279 (H) 221 (H) 206 (H) 269 (H)   Diabetes history: DM2 Outpatient Diabetes medications:  Metformin 1000 mg BID (not taking) Farxiga 10 mg QD (not taking) Current orders for Inpatient glycemic control:  Novolog 0-9 units TID  Metformin 500 mg BID Farxiga 10 mg QD  Inpatient Diabetes Program Recommendations:    -   Increase Novolog to 0-15 units tid + hs scale -   May also need Semglee 10 units Q24 hours.   Will continue to follow while inpatient.  Thanks, Tama Headings RN, MSN, BC-ADM Inpatient Diabetes Coordinator Team Pager [phone] (8a-5p)

## 2022-09-28 NOTE — Progress Notes (Signed)
Patient did not attend music therapy group.

## 2022-09-28 NOTE — BHH Suicide Risk Assessment (Signed)
[facility] INPATIENT:  Family/Significant Other Suicide Prevention Education  Suicide Prevention Education:  Patient Refusal for Family/Significant Other Suicide Prevention Education: The patient Craig Wagner has refused to provide written consent for family/significant other to be provided Family/Significant Other Suicide Prevention Education during admission and/or prior to discharge.  Physician notified.  SPE completed with pt, as pt refused to consent to family contact. SPI pamphlet provided to pt and pt was encouraged to share information with support network, ask questions, and talk about any concerns relating to SPE. Pt denies access to guns/firearms and verbalized understanding of information provided. Mobile Crisis information also provided to pt.   Pt stated that he had guns at home that are locked and secure when asked if pt had fire arms and if they were properly secured.   [name] 09/28/2022, 1:58 PM

## 2022-09-28 NOTE — Evaluation (Signed)
Physical Therapy Evaluation Patient Details Name: Craig Wagner MRN: [id number] DOB: 1956/09/07 Today's Date: 09/28/2022  History of Present Illness  Pt is a 66 y/o M admitted on 09/24/22 after presenting today under IVC from his PCPs office. Pt was in PCP office 2/2 a fall last week where he fell & injured R knee resulting in him using a walker. It was noted pt had not been taking care of himself since his wife passed away in 08-10-23, pt has quit taking medication, drinking with multiple falls, & pt endorsing suicidal ideation. R knee imaging on 09/19/22 was negative for acute injury. PMH: IBS, HTN, DM, DJD  Clinical Impression  Pt seen for PT evaluation with pt agreeable. Pt is AxOx3 with decreased safety awareness, decreased overall awareness. Pt reports prior to admission he was independent without AD but endorses a "couple falls" in the past 6 months. On this date, pt demonstrates fair strength in RLE but impaired balance, impaired righting reactions, and impaired safety awareness that impacts mobility. Pt requires min<>mod for balance 2/2 frequent LOB. Will continue to follow pt acutely to address balance, safety awareness, and gait with LRAD.   Recommendations for follow up therapy are one component of a multi-disciplinary discharge planning process, led by the attending physician.  Recommendations may be updated based on patient status, additional functional criteria and insurance authorization.  Follow Up Recommendations Skilled nursing-short term rehab (<3 hours/day) Can patient physically be transported by private vehicle: Yes    Assistance Recommended at Discharge Frequent or constant Supervision/Assistance  Patient can return home with the following  A lot of help with walking and/or transfers;A lot of help with bathing/dressing/bathroom;Assistance with feeding;Help with stairs or ramp for entrance;Assistance with cooking/housework;Assist for transportation;Direct supervision/assist for  financial management    Equipment Recommendations None recommended by PT  Recommendations for Other Services       Functional Status Assessment Patient has had a recent decline in their functional status and demonstrates the ability to make significant improvements in function in a reasonable and predictable amount of time.     Precautions / Restrictions Precautions Precautions: Fall Restrictions Weight Bearing Restrictions: No      Mobility  Bed Mobility Overal bed mobility: Modified Independent             General bed mobility comments: supine<>sit with Mod I    Transfers Overall transfer level: Needs assistance Equipment used: None Transfers: Sit to/from Stand Sit to Stand: Min assist, Min guard           General transfer comment: Pt requires assistance for STS from significantly low EOB. Pt demonstrates decreased anterior weight shifting, need to scoot forward, posterior lean onto EOB with BLE.    Ambulation/Gait Ambulation/Gait assistance: Min assist, Mod assist Gait Distance (Feet): 11 Feet (+ 40 ft) Assistive device: Rolling walker (2 wheels) Gait Pattern/deviations: Decreased step length - right, Decreased step length - left, Decreased stride length Gait velocity: decreased        Stairs            Wheelchair Mobility    Modified Rankin (Stroke Patients Only)       Balance Overall balance assessment: Needs assistance Sitting-balance support: Feet supported, Bilateral upper extremity supported Sitting balance-Leahy Scale: Good Sitting balance - Comments: supervision static sitting   Standing balance support: Bilateral upper extremity supported, During functional activity, Reliant on assistive device for balance Standing balance-Leahy Scale: Poor  Pertinent Vitals/Pain Pain Assessment Pain Assessment: Faces Faces Pain Scale: Hurts little more Pain Location: R knee with significant  flexion Pain Descriptors / Indicators: Discomfort, Grimacing Pain Intervention(s): Monitored during session, Repositioned    Home Living Family/patient expects to be discharged to:: Private residence Living Arrangements: Alone   Type of Home: House Home Access: Stairs to enter Entrance Stairs-Rails: Right;Left (wideset) Entrance Stairs-Number of Steps: 6   Home Layout:  (office on main level) Home Equipment: None      Prior Function Prior Level of Function : Independent/Modified Independent             Mobility Comments: Pt reports independence without AD but endorses a couple falls in the past 6 months.       Hand Dominance        Extremity/Trunk Assessment   Upper Extremity Assessment Upper Extremity Assessment: Overall WFL for tasks assessed    Lower Extremity Assessment Lower Extremity Assessment: Generalized weakness       Communication   Communication: No difficulties  Cognition Arousal/Alertness: Awake/alert Behavior During Therapy: WFL for tasks assessed/performed Overall Cognitive Status: No family/caregiver present to determine baseline cognitive functioning                                 General Comments: Pt is oriented to location, date, self, reports he's in the hospital "because they won't let me leave". Demonstrates decreased safety awareness as he feels he can d/c home & care for his dogs.        General Comments      Exercises     Assessment/Plan    PT Assessment Patient needs continued PT services  PT Problem List Pain;Decreased activity tolerance;Decreased knowledge of use of DME;Decreased safety awareness;Decreased balance;Decreased mobility;Decreased cognition       PT Treatment Interventions DME instruction;Therapeutic exercise;Gait training;Balance training;Stair training;Neuromuscular re-education;Functional mobility training;Cognitive remediation;Patient/family education;Therapeutic activities;Modalities     PT Goals (Current goals can be found in the Care Plan section)  Acute Rehab PT Goals Patient Stated Goal: R knee get better, go home to take care of dogs PT Goal Formulation: With patient Time For Goal Achievement: 10/12/22 Potential to Achieve Goals: Good    Frequency Min 2X/week     Co-evaluation               AM-PAC PT "6 Clicks" Mobility  Outcome Measure Help needed turning from your back to your side while in a flat bed without using bedrails?: None Help needed moving from lying on your back to sitting on the side of a flat bed without using bedrails?: None Help needed moving to and from a bed to a chair (including a wheelchair)?: A Little Help needed standing up from a chair using your arms (e.g., wheelchair or bedside chair)?: A Little Help needed to walk in hospital room?: A Lot Help needed climbing 3-5 steps with a railing? : Total 6 Click Score: 17    End of Session   Activity Tolerance: Patient tolerated treatment well Patient left: in bed;with call bell/phone within reach Nurse Communication: Mobility status PT Visit Diagnosis: Unsteadiness on feet (R26.81);Muscle weakness (generalized) (M62.81);Difficulty in walking, not elsewhere classified (R26.2);Other abnormalities of gait and mobility (R26.89)    Time: 9562-1308 PT Time Calculation (min) (ACUTE ONLY): 10 min   Charges:   PT Evaluation $PT Eval Moderate Complexity: 1 Mod          Lavone Nian, PT, DPT  09/28/22, 3:50 PM   Waunita Schooner 09/28/2022, 3:47 PM

## 2022-09-28 NOTE — Plan of Care (Signed)
Patient alert and cooperative on assessment but guarded.  Patient does not have HS medication but was given Trazadone for sleep.  Patient denies SI/HI and AVH.  Patient endorses anxiety and depression because he states he does not need to be here in this "prison", he was just running his mouth to his doctor and didn't really mean he wanted to kill himself.  Patient has isolated to room this shift.  Patient refused snack, stated I can't eat any of that.  Patient has stayed in bed this shift, requesting urinal to urinate because of the issues with his knee.  However, patient declines pain medication.  Patient's BS was 170 , no s/s of any diabetic distress noted.  Patient can contract for safety,  Q 15 minute rounds in progress, will continue to monitor.   Problem: Pain Managment: Goal: General experience of comfort will improve Outcome: Progressing   Problem: Safety: Goal: Ability to remain free from injury will improve Outcome: Progressing   Problem: Activity: Goal: Risk for activity intolerance will decrease Outcome: Not Progressing   Problem: Coping: Goal: Level of anxiety will decrease Outcome: Not Progressing

## 2022-09-28 NOTE — Tx Team (Signed)
Initial Treatment Plan 09/28/2022 1:04 PM [name] ZOX:[id number]    PATIENT STRESSORS: Loss of wife   PATIENT STRENGTHS: Capable of independent living  Communication skills    PATIENT IDENTIFIED PROBLEMS: No one to care for his dogs while he is in the hospital                     DISCHARGE CRITERIA:  Ability to meet basic life and health needs Adequate post-discharge living arrangements Improved stabilization in mood, thinking, and/or behavior  PRELIMINARY DISCHARGE PLAN: Return to previous living arrangement  PATIENT/FAMILY INVOLVEMENT: This treatment plan has been presented to and reviewed with the patient, Craig Wagner.  The patient has been given the opportunity to ask questions and make suggestions.  Joesph Fillers, RN 09/28/2022, 1:04 PM

## 2022-09-28 NOTE — Progress Notes (Incomplete)
Admission Note:  66 year old male admitted to geropsych unit under IVC from [place]. Patient has a history of IBS, HTN, diabetes and degenerative joint disease.  Patient stated he was brought to the hospital for a CT of his head.  Patient forwarded no information as to the situation leading to admission.  Per notes in patent's chart, patient visited his PCP for knee pain and made suicidal statements at the appointment.  Patient's largest stressor is the passing of his wife in 15 / 2023 after 68 years of marriage.  None of this information was obtained from the patient.   Patient was irritable, but cooperative with admission process. Poor eye contact   Patient denied SI/HI, AVH, anxiety and depression. Pain rated 3/10 in his right knee. His goal for treatment is to "get out of here."  Patient lives alone and wants to get home to his 3 dogs.  Patient denies smoking cigarettes and reports drinking alcohol occasionally with friends. Patient stated he no longer has a PCP and does not take any home medications.  Patient wears eye glasses.  Denies use of hearing aids or dentures.   Skin assessment completed with Roland Rack, RN. Healing scrapes on his left ankle and right forearm.  Red colored flat area on back that resembled acne.  No contraband found on patient.  Patient reported he ambulates with a walker at home. Patient unsteady on his feet and needs at least +1 assistance for transfers and ambulation.  Patient feels he can ambulate independently.  Per patient's notes, patient had a recent fall while using the restroom.  PT consult ordered.  Patient oriented to unit and given lunch.

## 2022-09-29 DIAGNOSIS — F332 Major depressive disorder, recurrent severe without psychotic features: Secondary | ICD-10-CM

## 2022-09-29 LAB — GLUCOSE, CAPILLARY
Glucose-Capillary: 298 mg/dL — ABNORMAL HIGH (ref 70–99)
Glucose-Capillary: 305 mg/dL — ABNORMAL HIGH (ref 70–99)
Glucose-Capillary: 214 mg/dL — ABNORMAL HIGH (ref 70–99)
Glucose-Capillary: 166 mg/dL — ABNORMAL HIGH (ref 70–99)

## 2022-09-29 MED ORDER — HALOPERIDOL 5 MG PO TABS: 5.0000 mg | ORAL_TABLET | Freq: Four times a day (QID) | ORAL | Status: DC | PRN

## 2022-09-29 MED ORDER — OLANZAPINE 5 MG PO TABS
10.0000 mg | ORAL_TABLET | Freq: Every day | ORAL | Status: DC
  Administered 2022-09-30: 10 mg via ORAL
  Filled 2022-09-29 (×2): qty 2

## 2022-09-29 NOTE — Plan of Care (Signed)
Pt received resting in bed, pt Aox4, calm, cooperative, preoccupied, intermittent irritability. Pt stated " I am upset that I am being held here against my will, there is nothing wrong with me." This writer provided support, encouraged pt to discuss feelings with treatment team. Pt refused Zyprexa. Pt c/o 6/10 R knee pain, pt reports Tylenol is helpful, PRN Tylenol given. HS BG 166. Pt reports eating a low carb diet at home. Pt educated on safe use of walker and encouraged to begin ambulating tomorrow, pt agreeable. Pt is hopeful knee surgery will resolve pain and mobility issues. Pt has possessions, urinal (emptied) at bedside, instructed to use call bell for assistance, pt verbalized an understanding. Overnight pt used call bell to get assistance to bathroom. Pt very unsteady, utilized walker to ambulate to bathroom. Staff monitored closely to ensure pt safety.    Pt slept 5 hours overnight.   Problem: Safety: Goal: Ability to remain free from injury will improve Outcome: Progressing

## 2022-09-29 NOTE — Group Note (Signed)
[facility] LCSW Group Therapy Note   Group Date: 09/29/2022 Start Time: 1300 End Time: 1345   Type of Therapy/Topic:  Group Therapy:  Emotion Regulation  Participation Level:  Did Not Attend     Description of Group:    The purpose of this group is to assist patients in learning to regulate negative emotions and experience positive emotions. Patients will be guided to discuss ways in which they have been vulnerable to their negative emotions. These vulnerabilities will be juxtaposed with experiences of positive emotions or situations, and patients challenged to use positive emotions to combat negative ones. Special emphasis will be placed on coping with negative emotions in conflict situations, and patients will process healthy conflict resolution skills.  Therapeutic Goals: Patient will identify two positive emotions or experiences to reflect on in order to balance out negative emotions:  Patient will label two or more emotions that they find the most difficult to experience:  Patient will be able to demonstrate positive conflict resolution skills through discussion or role plays:   Summary of Patient Progress:   X    Therapeutic Modalities:   Cognitive Behavioral Therapy Feelings Identification Dialectical Behavioral Therapy   Craig Wagner, LCSWA

## 2022-09-29 NOTE — Plan of Care (Signed)
D- Patient alert and oriented. Patient presents with an irritated and frustrated mood and affect. Patient required numerous prompting from writer during the morning on how to get into the wheelchair. Patient did not want to sit in the correct sitting position and wanted to get up in the wheelchair. Patient  denies SI, HI, AVH, but endorses pain. Pain stated that his knee hurt but declined any pain medication during the morning. Writer encouraged patient to use walker per PT recommendation; however, patient was not steady on his feet and almost fell. PT made aware. Writer will continue to educate the patient on the importance of mobility and assess gait as needed. Writer informed patient that we would continue to try and ambulate with the walker.   A- Scheduled medications administered to patient, per MD orders. Support and encouragement provided.  Routine safety checks conducted every 15 minutes.  Patient informed to notify staff with problems or concerns.  R- No adverse drug reactions noted. Patient contracts for safety at this time. Patient compliant with medications and treatment plan. Patient receptive, calm, and cooperative. Patient did not interact with others on the unit except for meal times. Patient in his room.  Patient remains safe at this time.   Problem: Health Behavior/Discharge Planning: Goal: Ability to manage health-related needs will improve Outcome: Not Progressing   Problem: Clinical Measurements: Goal: Will remain free from infection Outcome: Progressing Goal: Respiratory complications will improve Outcome: Progressing   Problem: Activity: Goal: Risk for activity intolerance will decrease Outcome: Not Progressing   Problem: Nutrition: Goal: Adequate nutrition will be maintained Outcome: Progressing   Problem: Coping: Goal: Level of anxiety will decrease Outcome: Not Progressing   Problem: Pain Managment: Goal: General experience of comfort will improve Outcome: Not  Progressing

## 2022-09-29 NOTE — H&P (Signed)
Psychiatric Admission Assessment Adult  Patient Identification: Craig Wagner MRN:  [id number] Date of Evaluation:  09/29/2022 Chief Complaint:  MDD (major depressive disorder), recurrent severe, without psychosis ([place]) [F33.2] Principal Diagnosis: MDD (major depressive disorder), recurrent severe, without psychosis ([place]) Diagnosis:  Principal Problem:   MDD (major depressive disorder), recurrent severe, without psychosis ([place])  History of Present Illness: Greco is a 66 year old white male who had made suicidal statements while visiting his primary care doctor and subsequently was involuntarily committed in the emergency room where he was transferred to [facility].  He does not have a significant psychiatric history and states that he made that statement under frustration.  He is having a lot of orthopedic problems including walking.  Recent CT of his head on 09/24/2022 shows:Scattered hypodensities within the right cerebellum and bilateral periventricular and subcortical white matter, consistent with age-indeterminate small vessel ischemic change.  He has a history of diabetes and hypertension.  His white count is slightly elevated along with his BUN and creatinine.  GFR is slightly decreased.  Sodium and potassium are slightly low.  AST and ALT are within normal limits.  Supposedly, he has a history of alcohol use.  He has not seen a psychiatrist in 20 years and that is when he was going through marriage counseling.  He denies being suicidal and says that he made stupid statements and now regrets it.  His wife passed away in August 10, 2023 from cancer.  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.  He is very irritable during the interview and insists on leaving.  I discussed medications with him but he states that he is not depressed but he is not euthymic, either.  He presented already on Wellbutrin.  I think he could benefit from a mood stabilizer.  Physical therapy saw him yesterday and  recommended treatment and a walker.  No past suicide attempts.  No past psychiatric hospitalizations.  He says that he will see a therapist if that is what we recommend.  He has very poor insight.  He does not have any children.  His biggest support is [employer].  (Friend).  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  Is the patient at risk to self? Yes.    Has the patient been a risk to self in the past 6 months? Yes.    Has the patient been a risk to self within the distant past? No.  Is the patient a risk to others? No.  Has the patient been a risk to others in the past 6 months? No.  Has the patient been a risk to others within the distant past? No.   [place] Scale:  [place] Admission (Current) from 09/28/2022 in [facility] ED from 09/24/2022 in [facility]PT ED from 09/19/2022 in [facility] No Risk No Risk No Risk        Prior Inpatient Therapy: No. If yes, describe  Prior Outpatient Therapy: No. If yes, describe   Alcohol Screening: 1. How often do you have a drink containing alcohol?: Monthly or less 2. How many drinks containing alcohol do you have on a typical day when you are drinking?: 1 or 2 3. How often do you  have six or more drinks on one occasion?: Never AUDIT-C Score: 1 4. How often during the last year have you found that you were not able to stop drinking once you had started?: Never 5. How often during the last year have you failed to do what was normally expected from you because of drinking?: Never 6. How often during the last year have you needed a first drink in the  morning to get yourself going after a heavy drinking session?: Never 7. How often during the last year have you had a feeling of guilt of remorse after drinking?: Never 8. How often during the last year have you been unable to remember what happened the night before because you had been drinking?: Never 9. Have you or someone else been injured as a result of your drinking?: No 10. Has a relative or friend or a doctor or another health worker been concerned about your drinking or suggested you cut down?: No Alcohol Use Disorder Identification Test Final Score (AUDIT): 1 Alcohol Brief Interventions/Follow-up: Alcohol education/Brief advice Substance Abuse History in the last 12 months:  Yes.   Consequences of Substance Abuse: NA Previous Psychotropic Medications: Yes  Psychological Evaluations: Yes  Past Medical History:  Past Medical History:  Diagnosis Date   Degenerative joint disease    Diabetes mellitus without complication (Person)    Diverticulitis    Gout    Hypertension    Involuntary commitment 09/25/2022   Irritable bowel syndrome    Sleep apnea     Past Surgical History:  Procedure Laterality Date   BIOPSY  10/06/2020   Procedure: BIOPSY;  Surgeon: Eloise Harman, DO;  Location: [facility];  Service: Endoscopy;;   COLONOSCOPY WITH PROPOFOL N/A 10/06/2020   Colonoscopy with non-bleeding internal hemorrhoids. Many diverticula in entire colon. Localized mild inflammation in sigmoid colon. Colon mucosa with hyperemia, negative inflammation.    compound fx tibia and fibula     WRIST SURGERY Left    Family History:  Family History  Problem Relation Age of Onset   Cancer Mother    Heart disease Father    Colon cancer Neg Hx    Colon polyps Neg Hx    Family Psychiatric  History: Unremarkable Tobacco Screening:  Social History   Tobacco Use  Smoking Status Never  Smokeless Tobacco Never    [facility] Tobacco Counseling     Are you interested in Tobacco Cessation  Medications?  N/A, patient does not use tobacco products Counseled patient on smoking cessation:  No value filed. Reason Tobacco Screening Not Completed: No value filed.       Social History:  Social History   Substance and Sexual Activity  Alcohol Use Yes   Alcohol/week: 1.0 standard drink of alcohol   Types: 1 Cans of beer per week     Social History   Substance and Sexual Activity  Drug Use No    Additional Social History: Marital status: Widowed Widowed, when?: "10/23" Are you sexually active?: No What is your sexual orientation?: unable to assess Has your sexual activity been affected by drugs, alcohol, medication, or emotional stress?: unable to assess Does patient have children?: No                         Allergies:  No Known Allergies Lab Results:  Results for orders placed or performed during the hospital encounter of 09/28/22 (from the past 48 hour(s))  Glucose, capillary  Status: Abnormal   Collection Time: 09/28/22 12:24 PM  Result Value Ref Range   Glucose-Capillary 269 (H) 70 - 99 mg/dL    Comment: Glucose reference range applies only to samples taken after fasting for at least 8 hours.  Glucose, capillary     Status: Abnormal   Collection Time: 09/28/22  4:28 PM  Result Value Ref Range   Glucose-Capillary 264 (H) 70 - 99 mg/dL    Comment: Glucose reference range applies only to samples taken after fasting for at least 8 hours.  Glucose, capillary     Status: Abnormal   Collection Time: 09/28/22  8:35 PM  Result Value Ref Range   Glucose-Capillary 170 (H) 70 - 99 mg/dL    Comment: Glucose reference range applies only to samples taken after fasting for at least 8 hours.   Comment 1 Notify RN   Glucose, capillary     Status: Abnormal   Collection Time: 09/29/22  8:24 AM  Result Value Ref Range   Glucose-Capillary 298 (H) 70 - 99 mg/dL    Comment: Glucose reference range applies only to samples taken after fasting for at least 8 hours.     Blood Alcohol level:  Lab Results  Component Value Date   ETH <10 17/61/6073    Metabolic Disorder Labs:  Lab Results  Component Value Date   HGBA1C 6.5 (H) 07/06/2018   MPG 140 07/06/2018   MPG 154 03/28/2018   No results found for: "PROLACTIN" Lab Results  Component Value Date   CHOL 133 07/06/2018   TRIG 300 (H) 07/06/2018   HDL 36 (L) 07/06/2018   CHOLHDL 3.7 07/06/2018   LDLCALC 65 07/06/2018   LDLCALC 156 (H) 03/28/2018    Current Medications: Current Facility-Administered Medications  Medication Dose Route Frequency Provider Last Rate Last Admin   acetaminophen (TYLENOL) tablet 650 mg  650 mg Oral Q6H PRN Parks Ranger, DO       alum & mag hydroxide-simeth (MAALOX/MYLANTA) 200-200-20 MG/5ML suspension 30 mL  30 mL Oral Q4H PRN Parks Ranger, DO       atorvastatin (LIPITOR) tablet 20 mg  20 mg Oral Daily Parks Ranger, DO   20 mg at 09/29/22 0905   buPROPion (WELLBUTRIN XL) 24 hr tablet 150 mg  150 mg Oral q morning Parks Ranger, DO   150 mg at 09/29/22 7106   dapagliflozin propanediol (FARXIGA) tablet 10 mg  10 mg Oral Daily Parks Ranger, DO   10 mg at 09/29/22 2694   haloperidol (HALDOL) tablet 5 mg  5 mg Oral Q6H PRN Parks Ranger, DO       lisinopril (ZESTRIL) tablet 20 mg  20 mg Oral Daily Parks Ranger, DO   20 mg at 09/29/22 8546   And   hydrochlorothiazide (HYDRODIURIL) tablet 12.5 mg  12.5 mg Oral Daily Parks Ranger, DO   12.5 mg at 09/29/22 2703   insulin aspart (novoLOG) injection 0-9 Units  0-9 Units Subcutaneous TID WC Parks Ranger, DO   5 Units at 09/29/22 5009   LORazepam (ATIVAN) tablet 1 mg  1 mg Oral Q4H PRN Parks Ranger, DO       magnesium hydroxide (MILK OF MAGNESIA) suspension 30 mL  30 mL Oral Daily PRN Parks Ranger, DO       metFORMIN (GLUCOPHAGE-XR) 24 hr tablet 500 mg  500 mg Oral BID WC Parks Ranger, DO   500 mg at  09/29/22 [phone]  OLANZapine (ZYPREXA) tablet 10 mg  10 mg Oral QHS Parks Ranger, DO       traZODone (DESYREL) tablet 100 mg  100 mg Oral QHS PRN Parks Ranger, DO   100 mg at 09/28/22 2138   PTA Medications: Medications Prior to Admission  Medication Sig Dispense Refill Last Dose   atorvastatin (LIPITOR) 20 MG tablet TAKE 1 TABLET BY MOUTH ONCE DAILY (Patient not taking: Reported on 09/25/2022) 30 tablet 0    atorvastatin (LIPITOR) 20 MG tablet Take 1 tablet (20 mg total) by mouth daily. (Patient not taking: Reported on 09/25/2022) 90 tablet 1    buPROPion (WELLBUTRIN XL) 150 MG 24 hr tablet TAKE 1 TABLET BY MOUTH ONCE DAILY EVERY MORNING (Patient not taking: Reported on 09/25/2022) 90 tablet 1    buPROPion (WELLBUTRIN XL) 150 MG 24 hr tablet TAKE 1 TABLET BY MOUTH ONCE DAILY EVERY MORNING (Patient not taking: Reported on 09/25/2022) 30 tablet 1    dapagliflozin propanediol (FARXIGA) 10 MG TABS tablet Take 1 tablet (10 mg total) by mouth daily. (Patient not taking: Reported on 09/25/2022) 90 tablet 0    finasteride (PROPECIA) 1 MG tablet Take 1 tablet (1 mg total) by mouth daily. (Patient not taking: Reported on 09/25/2022) 90 tablet 1    ibuprofen (ADVIL) 200 MG tablet Take 200-400 mg by mouth every 6 (six) hours as needed for mild pain or headache.      lisinopril-hydrochlorothiazide (ZESTORETIC) 20-12.5 MG tablet Take 1 tablet by mouth daily. (Patient not taking: Reported on 09/25/2022) 30 tablet 2    metFORMIN (GLUCOPHAGE-XR) 500 MG 24 hr tablet Take 1 tablet (500 mg total) by mouth every evening with a meal for 7 days, THEN 2 tablets (1,000 mg total) 2 (two) times daily. (Patient not taking: Reported on 09/25/2022) 106 tablet 0    sildenafil (REVATIO) 20 MG tablet Take 2-5 tablets (40-100 mg total) by mouth 30 minutes before needed. (Patient not taking: Reported on 09/25/2022) 30 tablet 0     Musculoskeletal: Strength & Muscle Tone: within normal limits Gait & Station:  unsteady Patient leans: Right            Psychiatric Specialty Exam:  Presentation  General Appearance:  Casual  Eye Contact: Fair  Speech: Clear and Coherent; Normal Rate  Speech Volume: Decreased  Handedness: Right   Mood and Affect  Mood: Euthymic  Affect: Non-Congruent; Constricted; Blunt   Thought Process  Thought Processes: Coherent; Linear  Duration of Psychotic Symptoms:N/A Past Diagnosis of Schizophrenia or Psychoactive disorder: No  Descriptions of Associations:Intact  Orientation:Full (Time, Place and Person)  Thought Content:Logical  Hallucinations:No data recorded Ideas of Reference:None  Suicidal Thoughts:No data recorded Homicidal Thoughts:No data recorded  Sensorium  Memory: Immediate Good; Recent Good; Remote Good  Judgment: Intact  Insight: Lacking   Executive Functions  Concentration: Fair  Attention Span: Fair  Recall: Good  Fund of Knowledge: Good  Language: Good   Psychomotor Activity  Psychomotor Activity:No data recorded  Assets  Assets: Communication Skills; Desire for Improvement; Financial Resources/Insurance; Housing   Sleep  Sleep:No data recorded   Physical Exam: Physical Exam Vitals and nursing note reviewed.  Constitutional:      Appearance: Normal appearance. He is normal weight.  HENT:     Head: Normocephalic and atraumatic.     Nose: Nose normal.     Mouth/Throat:     Pharynx: Oropharynx is clear.  Eyes:     Extraocular Movements: Extraocular movements intact.  Pupils: Pupils are equal, round, and reactive to light.  Cardiovascular:     Rate and Rhythm: Normal rate and regular rhythm.     Pulses: Normal pulses.     Heart sounds: Normal heart sounds.  Pulmonary:     Effort: Pulmonary effort is normal.     Breath sounds: Normal breath sounds.  Abdominal:     General: Abdomen is flat. Bowel sounds are normal.     Palpations: Abdomen is soft.  Musculoskeletal:         General: Normal range of motion.     Cervical back: Normal range of motion and neck supple.  Skin:    General: Skin is warm and dry.  Neurological:     General: No focal deficit present.     Mental Status: He is alert and oriented to person, place, and time.  Psychiatric:        Attention and Perception: Attention and perception normal.        Mood and Affect: Mood is anxious. Affect is angry.        Speech: Speech normal.        Behavior: Behavior is agitated and withdrawn.        Thought Content: Thought content is paranoid.        Cognition and Memory: Cognition and memory normal.        Judgment: Judgment is impulsive and inappropriate.    Review of Systems  Constitutional: Negative.   HENT: Negative.    Eyes: Negative.   Respiratory: Negative.    Cardiovascular: Negative.   Gastrointestinal: Negative.   Genitourinary: Negative.   Musculoskeletal: Negative.   Skin: Negative.   Neurological: Negative.   Endo/Heme/Allergies: Negative.   Psychiatric/Behavioral:  Positive for substance abuse and suicidal ideas. The patient is nervous/anxious.    Blood pressure 122/82, pulse 77, temperature 97.9 F (36.6 C), temperature source Oral, resp. rate 18, height 6' (1.829 m), weight 78.9 kg, SpO2 99 %. Body mass index is 23.6 kg/m.  Treatment Plan Summary: Daily contact with patient to assess and evaluate symptoms and progress in treatment, Medication management, and Plan continue Wellbutrin and start Zyprexa 10 mg at bedtime.  Social work is working on outpatient appointments.  Observation Level/Precautions:  15 minute checks  Laboratory:  CBC Chemistry Profile HbAIC  Psychotherapy:    Medications:    Consultations:    Discharge Concerns:    Estimated LOS:  Other:     Physician Treatment Plan for Primary Diagnosis: MDD (major depressive disorder), recurrent severe, without psychosis ([place]) Long Term Goal(s): Improvement in symptoms so as ready for discharge  Short Term  Goals: Ability to identify changes in lifestyle to reduce recurrence of condition will improve, Ability to verbalize feelings will improve, Ability to disclose and discuss suicidal ideas, Ability to demonstrate self-control will improve, Ability to identify and develop effective coping behaviors will improve, Ability to maintain clinical measurements within normal limits will improve, Compliance with prescribed medications will improve, and Ability to identify triggers associated with substance abuse/mental health issues will improve  Physician Treatment Plan for Secondary Diagnosis: Principal Problem:   MDD (major depressive disorder), recurrent severe, without psychosis ([place])    I certify that inpatient services furnished can reasonably be expected to improve the patient's condition.    Rein[place], DO 12/6/202310:15 AM

## 2022-09-29 NOTE — Inpatient Diabetes Management (Signed)
Inpatient Diabetes Program Recommendations  AACE/ADA: New Consensus Statement on Inpatient Glycemic Control   Target Ranges:  Prepandial:   less than 140 mg/dL      Peak postprandial:   less than 180 mg/dL (1-2 hours)      Critically ill patients:  140 - 180 mg/dL    Latest Reference Range & Units 09/29/22 08:24 09/29/22 11:24  Glucose-Capillary 70 - 99 mg/dL 298 (H) 305 (H)   Review of Glycemic Control  Diabetes history: DM2 Outpatient Diabetes medications: Metformin 1000 mg BID (not taking), Farxiga 10 mg daily (not taking) Current orders for Inpatient glycemic control: Novolog 0-9 units TID with meals, Metformin XR 500 mg BID, Farxiga 10 mg daily  Inpatient Diabetes Program Recommendations:    Insulin: Noted Wilder Glade started today. If glucose remains consistently over 180 mg/dl, please consider ordering Semglee 8 units Q24H and Novolog 3 units TID with meals for meal coverage if patient eats at least 50% of meals.  Thanks, Barnie Alderman, RN, MSN, [place] Diabetes Coordinator Inpatient Diabetes Program [phone] (Team Pager from 8am to [place])

## 2022-09-29 NOTE — [facility] Suicide Risk Assessment (Signed)
[facility] Admission Suicide Risk Assessment   Nursing information obtained from:  Patient Demographic factors:  Male, Divorced or widowed, Living alone Current Mental Status:  NA Loss Factors:  Loss of significant relationship (Wife passed 08-Aug-2022) Historical Factors:  NA Risk Reduction Factors:  Positive social support  Total Time spent with patient: 1 hour Principal Problem: MDD (major depressive disorder), recurrent severe, without psychosis ([place]) Diagnosis:  Principal Problem:   MDD (major depressive disorder), recurrent severe, without psychosis ([place])  Subjective Data: Per EMS- Patient was picked up from his PCP office this afternoon. Patient told the PCP that he wanted to shoot himself and had guns at home.   Patient states he has not been taking his medications since his wife died in Aug 09, 2023.  Patient also reports that he has had several falls and has been injuring his right knee. Patient also told the PCP that he has not been eating or drinking and is now having to crawl to the bathroom.  Continued Clinical Symptoms:  Alcohol Use Disorder Identification Test Final Score (AUDIT): 1 The "Alcohol Use Disorders Identification Test", Guidelines for Use in Primary Care, Second Edition.  World Pharmacologist [facility]). Score between 0-7:  no or low risk or alcohol related problems. Score between 8-15:  moderate risk of alcohol related problems. Score between 16-19:  high risk of alcohol related problems. Score 20 or above:  warrants further diagnostic evaluation for alcohol dependence and treatment.   CLINICAL FACTORS:   Depression:   Anhedonia   Musculoskeletal: Strength & Muscle Tone: within normal limits Gait & Station: unsteady Patient leans: N/A  Psychiatric Specialty Exam:  Presentation  General Appearance:  Casual  Eye Contact: Fair  Speech: Clear and Coherent; Normal Rate  Speech Volume: Decreased  Handedness: Right   Mood and Affect   Mood: Euthymic  Affect: Non-Congruent; Constricted; Blunt   Thought Process  Thought Processes: Coherent; Linear  Descriptions of Associations:Intact  Orientation:Full (Time, Place and Person)  Thought Content:Logical  History of Schizophrenia/Schizoaffective disorder:No  Duration of Psychotic Symptoms:No data recorded Hallucinations:No data recorded Ideas of Reference:None  Suicidal Thoughts:No data recorded Homicidal Thoughts:No data recorded  Sensorium  Memory: Immediate Good; Recent Good; Remote Good  Judgment: Intact  Insight: Lacking   Executive Functions  Concentration: Fair  Attention Span: Fair  Recall: Good  Fund of Knowledge: Good  Language: Good   Psychomotor Activity  Psychomotor Activity:No data recorded  Assets  Assets: Communication Skills; Desire for Improvement; Financial Resources/Insurance; Housing   Sleep  Sleep:No data recorded    Blood pressure 122/82, pulse 77, temperature 97.9 F (36.6 C), temperature source Oral, resp. rate 18, height 6' (1.829 m), weight 78.9 kg, SpO2 99 %. Body mass index is 23.6 kg/m.   COGNITIVE FEATURES THAT CONTRIBUTE TO RISK:  None    SUICIDE RISK:   Minimal: No identifiable suicidal ideation.  Patients presenting with no risk factors but with morbid ruminations; may be classified as minimal risk based on the severity of the depressive symptoms  PLAN OF CARE: See orders  I certify that inpatient services furnished can reasonably be expected to improve the patient's condition.   Parks Ranger, DO 09/29/2022, 8:50 AM

## 2022-09-29 NOTE — Progress Notes (Signed)
Physical Therapy Treatment Patient Details Name: Craig Wagner MRN: [id number] DOB: Nov 29, 1955 Today's Date: 09/29/2022   History of Present Illness Pt is a 66 y/o M admitted on 09/24/22 after presenting today under IVC from his PCPs office. Pt was in PCP office 2/2 a fall last week where he fell & injured R knee resulting in him using a walker. It was noted pt had not been taking care of himself since his wife passed away in Sep 01, 2023, pt has quit taking medication, drinking with multiple falls, & pt endorsing suicidal ideation. R knee imaging on 09/19/22 was negative for acute injury. PMH: IBS, HTN, DM, DJD    PT Comments    Patient received in room, staff present. Patient reports right knee pain. He requires cues for sit to stand, but no physical assist. Ambulated 30 feet with RW. Step-to, antalgic pattern due to right knee pain. Patient ambulation distance limited by pain. Min guard and cues for safety needed. Patient instructed to take something for pain to tolerate mobility better. He will continue to benefit from skilled PT to improve independence and reduce pain.       Recommendations for follow up therapy are one component of a multi-disciplinary discharge planning process, led by the attending physician.  Recommendations may be updated based on patient status, additional functional criteria and insurance authorization.  Follow Up Recommendations  Home health PT Can patient physically be transported by private vehicle: Yes   Assistance Recommended at Discharge Frequent or constant Supervision/Assistance  Patient can return home with the following A little help with walking and/or transfers;A little help with bathing/dressing/bathroom;Help with stairs or ramp for entrance;Assist for transportation;Assistance with cooking/housework;Direct supervision/assist for medications management   Equipment Recommendations  Rolling walker (2 wheels)    Recommendations for Other Services        Precautions / Restrictions Precautions Precautions: Fall Restrictions Weight Bearing Restrictions: No     Mobility  Bed Mobility               General bed mobility comments: NT patient received sitting eob and remained sitting upon end of session    Transfers Overall transfer level: Needs assistance Equipment used: None Transfers: Sit to/from Stand Sit to Stand: Min guard           General transfer comment: Cues for hand placement    Ambulation/Gait Ambulation/Gait assistance: Min guard Gait Distance (Feet): 30 Feet Assistive device: Rolling walker (2 wheels) Gait Pattern/deviations: Step-to pattern, Decreased weight shift to right, Antalgic Gait velocity: decreased     General Gait Details: limited distance by pain in right knee   Stairs             Wheelchair Mobility    Modified Rankin (Stroke Patients Only)       Balance Overall balance assessment: Needs assistance, History of Falls Sitting-balance support: Feet supported Sitting balance-Leahy Scale: Normal     Standing balance support: Bilateral upper extremity supported, During functional activity Standing balance-Leahy Scale: Good Standing balance comment: good balance with B UE support                            Cognition Arousal/Alertness: Awake/alert Behavior During Therapy: WFL for tasks assessed/performed Overall Cognitive Status: No family/caregiver present to determine baseline cognitive functioning  Exercises Other Exercises Other Exercises: instructed patient to take mild pain medication to relieve R Knee pain for improved mobility tolerance. Instructed in LAQ seated to improve joint lubrication, mobility to decrease pain    General Comments        Pertinent Vitals/Pain Pain Assessment Pain Assessment: Faces Faces Pain Scale: Hurts even more Pain Location: R knee Pain Descriptors / Indicators:  Discomfort, Grimacing, Guarding, Sore Pain Intervention(s): Monitored during session, Repositioned    Home Living                          Prior Function            PT Goals (current goals can now be found in the care plan section) Acute Rehab PT Goals Patient Stated Goal: R knee get better, go home to take care of dogs PT Goal Formulation: With patient Time For Goal Achievement: 10/12/22 Potential to Achieve Goals: Good Progress towards PT goals: Not progressing toward goals - comment (due to knee pain)    Frequency    Min 2X/week      PT Plan Discharge plan needs to be updated    Co-evaluation              AM-PAC PT "6 Clicks" Mobility   Outcome Measure  Help needed turning from your back to your side while in a flat bed without using bedrails?: None Help needed moving from lying on your back to sitting on the side of a flat bed without using bedrails?: None Help needed moving to and from a bed to a chair (including a wheelchair)?: A Little Help needed standing up from a chair using your arms (e.g., wheelchair or bedside chair)?: A Little Help needed to walk in hospital room?: A Little Help needed climbing 3-5 steps with a railing? : A Lot 6 Click Score: 19    End of Session   Activity Tolerance: Patient limited by pain Patient left: in bed;with call bell/phone within reach Nurse Communication: Mobility status;Patient requests pain meds PT Visit Diagnosis: Unsteadiness on feet (R26.81);Difficulty in walking, not elsewhere classified (R26.2);Pain Pain - Right/Left: Right Pain - part of body: Knee     Time: 1245-8099 PT Time Calculation (min) (ACUTE ONLY): 13 min  Charges:  $Gait Training: 8-22 mins                     Craig Wagner, PT, GCS 09/29/22,2:49 PM

## 2022-09-29 NOTE — BH IP Treatment Plan (Signed)
Interdisciplinary Treatment and Diagnostic Plan Update  09/29/2022 Time of Session: 10:00AM Craig Wagner MRN: [id number]  Principal Diagnosis: MDD (major depressive disorder), recurrent severe, without psychosis ([place])  Secondary Diagnoses: Principal Problem:   MDD (major depressive disorder), recurrent severe, without psychosis ([place])   Current Medications:  Current Facility-Administered Medications  Medication Dose Route Frequency Provider Last Rate Last Admin   acetaminophen (TYLENOL) tablet 650 mg  650 mg Oral Q6H PRN Parks Ranger, DO       alum & mag hydroxide-simeth (MAALOX/MYLANTA) 200-200-20 MG/5ML suspension 30 mL  30 mL Oral Q4H PRN Parks Ranger, DO       atorvastatin (LIPITOR) tablet 20 mg  20 mg Oral Daily Parks Ranger, DO   20 mg at 09/29/22 0905   buPROPion (WELLBUTRIN XL) 24 hr tablet 150 mg  150 mg Oral q morning Parks Ranger, DO   150 mg at 09/29/22 2197   dapagliflozin propanediol (FARXIGA) tablet 10 mg  10 mg Oral Daily Parks Ranger, DO   10 mg at 09/29/22 5883   haloperidol (HALDOL) tablet 5 mg  5 mg Oral Q6H PRN Parks Ranger, DO       lisinopril (ZESTRIL) tablet 20 mg  20 mg Oral Daily Parks Ranger, DO   20 mg at 09/29/22 2549   And   hydrochlorothiazide (HYDRODIURIL) tablet 12.5 mg  12.5 mg Oral Daily Parks Ranger, DO   12.5 mg at 09/29/22 8264   insulin aspart (novoLOG) injection 0-9 Units  0-9 Units Subcutaneous TID WC Parks Ranger, DO   5 Units at 09/29/22 1583   LORazepam (ATIVAN) tablet 1 mg  1 mg Oral Q4H PRN Parks Ranger, DO       magnesium hydroxide (MILK OF MAGNESIA) suspension 30 mL  30 mL Oral Daily PRN Parks Ranger, DO       metFORMIN (GLUCOPHAGE-XR) 24 hr tablet 500 mg  500 mg Oral BID WC Parks Ranger, DO   500 mg at 09/29/22 0903   OLANZapine (ZYPREXA) tablet 10 mg  10 mg Oral QHS Parks Ranger, DO       traZODone  (DESYREL) tablet 100 mg  100 mg Oral QHS PRN Parks Ranger, DO   100 mg at 09/28/22 2138   PTA Medications: Medications Prior to Admission  Medication Sig Dispense Refill Last Dose   atorvastatin (LIPITOR) 20 MG tablet TAKE 1 TABLET BY MOUTH ONCE DAILY (Patient not taking: Reported on 09/25/2022) 30 tablet 0    atorvastatin (LIPITOR) 20 MG tablet Take 1 tablet (20 mg total) by mouth daily. (Patient not taking: Reported on 09/25/2022) 90 tablet 1    buPROPion (WELLBUTRIN XL) 150 MG 24 hr tablet TAKE 1 TABLET BY MOUTH ONCE DAILY EVERY MORNING (Patient not taking: Reported on 09/25/2022) 90 tablet 1    buPROPion (WELLBUTRIN XL) 150 MG 24 hr tablet TAKE 1 TABLET BY MOUTH ONCE DAILY EVERY MORNING (Patient not taking: Reported on 09/25/2022) 30 tablet 1    dapagliflozin propanediol (FARXIGA) 10 MG TABS tablet Take 1 tablet (10 mg total) by mouth daily. (Patient not taking: Reported on 09/25/2022) 90 tablet 0    finasteride (PROPECIA) 1 MG tablet Take 1 tablet (1 mg total) by mouth daily. (Patient not taking: Reported on 09/25/2022) 90 tablet 1    ibuprofen (ADVIL) 200 MG tablet Take 200-400 mg by mouth every 6 (six) hours as needed for mild pain or headache.      lisinopril-hydrochlorothiazide (  ZESTORETIC) 20-12.5 MG tablet Take 1 tablet by mouth daily. (Patient not taking: Reported on 09/25/2022) 30 tablet 2    metFORMIN (GLUCOPHAGE-XR) 500 MG 24 hr tablet Take 1 tablet (500 mg total) by mouth every evening with a meal for 7 days, THEN 2 tablets (1,000 mg total) 2 (two) times daily. (Patient not taking: Reported on 09/25/2022) 106 tablet 0    sildenafil (REVATIO) 20 MG tablet Take 2-5 tablets (40-100 mg total) by mouth 30 minutes before needed. (Patient not taking: Reported on 09/25/2022) 30 tablet 0     Patient Stressors:    Patient Strengths: Capable of independent living  Communication skills   Treatment Modalities: Medication Management, Group therapy, Case management,  1 to 1 session with  clinician, Psychoeducation, Recreational therapy.   Physician Treatment Plan for Primary Diagnosis: MDD (major depressive disorder), recurrent severe, without psychosis ([place]) Long Term Goal(s):     Short Term Goals:    Medication Management: Evaluate patient's response, side effects, and tolerance of medication regimen.  Therapeutic Interventions: 1 to 1 sessions, Unit Group sessions and Medication administration.  Evaluation of Outcomes: Not Met  Physician Treatment Plan for Secondary Diagnosis: Principal Problem:   MDD (major depressive disorder), recurrent severe, without psychosis ([place])  Long Term Goal(s):     Short Term Goals:       Medication Management: Evaluate patient's response, side effects, and tolerance of medication regimen.  Therapeutic Interventions: 1 to 1 sessions, Unit Group sessions and Medication administration.  Evaluation of Outcomes: Not Met   RN Treatment Plan for Primary Diagnosis: MDD (major depressive disorder), recurrent severe, without psychosis ([place]) Long Term Goal(s): Knowledge of disease and therapeutic regimen to maintain health will improve  Short Term Goals: Ability to remain free from injury will improve, Ability to verbalize frustration and anger appropriately will improve, Ability to demonstrate self-control, Ability to participate in decision making will improve, Ability to verbalize feelings will improve, Ability to disclose and discuss suicidal ideas, Ability to identify and develop effective coping behaviors will improve, and Compliance with prescribed medications will improve  Medication Management: RN will administer medications as ordered by provider, will assess and evaluate patient's response and provide education to patient for prescribed medication. RN will report any adverse and/or side effects to prescribing provider.  Therapeutic Interventions: 1 on 1 counseling sessions, Psychoeducation, Medication administration, Evaluate  responses to treatment, Monitor vital signs and CBGs as ordered, Perform/monitor CIWA, COWS, AIMS and Fall Risk screenings as ordered, Perform wound care treatments as ordered.  Evaluation of Outcomes: Not Met   LCSW Treatment Plan for Primary Diagnosis: MDD (major depressive disorder), recurrent severe, without psychosis ([place]) Long Term Goal(s): Safe transition to appropriate next level of care at discharge, Engage patient in therapeutic group addressing interpersonal concerns.  Short Term Goals: Engage patient in aftercare planning with referrals and resources, Increase social support, Increase ability to appropriately verbalize feelings, Increase emotional regulation, and Facilitate acceptance of mental health diagnosis and concerns  Therapeutic Interventions: Assess for all discharge needs, 1 to 1 time with Social worker, Explore available resources and support systems, Assess for adequacy in community support network, Educate family and significant other(s) on suicide prevention, Complete Psychosocial Assessment, Interpersonal group therapy.  Evaluation of Outcomes: Not Met   Progress in Treatment: Attending groups: No. Participating in groups: No. Taking medication as prescribed: Yes. Toleration medication: Yes. Family/Significant other contact made: No, will contact:  pt declined permission Patient understands diagnosis: No. Discussing patient identified problems/goals with staff: Yes. Medical problems  stabilized or resolved: Yes. Denies suicidal/homicidal ideation: Yes. Issues/concerns per patient self-inventory: No. Other: None  New problem(s) identified: No, Describe:  none  New Short Term/Long Term Goal(s): Patient to work towards  medication management for mood stabilization; elimination of SI thoughts; development of comprehensive mental wellness plan.   Patient Goals:  "ready to get out of here"  Discharge Plan or Barriers: CSW will assist pt with development of  appropriate discharge/after care plan.   Reason for Continuation of Hospitalization: Depression Medical Issues Medication stabilization  Estimated Length of Stay: 1-7 days  Last [place] Suicide Severity Risk Score: Christine Admission (Current) from 09/28/2022 in [facility] ED from 09/24/2022 in [facility] ED from 09/19/2022 in [facility] No Risk No Risk No Risk       Last PHQ 2/9 Scores:    04/20/2018    1:18 PM 04/12/2018    4:07 PM 03/28/2018    2:10 PM  Depression screen PHQ 2/9  Decreased Interest 0 1 3  Down, Depressed, Hopeless 0 1 3  PHQ - 2 Score 0 2 6  Altered sleeping 0 0 2  Tired, decreased energy 0 0 2  Change in appetite 0 0 2  Feeling bad or failure about yourself  0 0 3  Trouble concentrating 0 0 3  Moving slowly or fidgety/restless 0 0 3  Suicidal thoughts 0 0 1  PHQ-9 Score 0 2 22  Difficult doing work/chores  Not difficult at all Somewhat difficult    Scribe for Treatment Team:  A Martinique, [place] 09/29/2022 10:26 AM

## 2022-09-29 NOTE — Progress Notes (Signed)
Group Note: MHT took patients outside for fresh air and recreational therapy. Also MHT completed music/art therapy with patients.   In Attendance: NO

## 2022-09-30 DIAGNOSIS — F332 Major depressive disorder, recurrent severe without psychotic features: Secondary | ICD-10-CM | POA: Diagnosis not present

## 2022-09-30 LAB — GLUCOSE, CAPILLARY
Glucose-Capillary: 202 mg/dL — ABNORMAL HIGH (ref 70–99)
Glucose-Capillary: 197 mg/dL — ABNORMAL HIGH (ref 70–99)
Glucose-Capillary: 211 mg/dL — ABNORMAL HIGH (ref 70–99)
Glucose-Capillary: 271 mg/dL — ABNORMAL HIGH (ref 70–99)

## 2022-09-30 MED ORDER — HYDROCHLOROTHIAZIDE 25 MG PO TABS
25.0000 mg | ORAL_TABLET | Freq: Every day | ORAL | Status: DC
  Administered 2022-10-01: 25 mg via ORAL
  Filled 2022-09-30: qty 1

## 2022-09-30 MED ORDER — INSULIN ASPART 100 UNIT/ML IJ SOLN
3.0000 [IU] | Freq: Three times a day (TID) | INTRAMUSCULAR | Status: DC
  Administered 2022-09-30 – 2022-10-01 (×3): 3 [IU] via SUBCUTANEOUS
  Filled 2022-09-30 (×3): qty 1

## 2022-09-30 MED ORDER — LISINOPRIL 20 MG PO TABS
20.0000 mg | ORAL_TABLET | Freq: Every day | ORAL | Status: DC
  Administered 2022-10-01: 20 mg via ORAL
  Filled 2022-09-30: qty 1

## 2022-09-30 MED ORDER — INSULIN DETEMIR 100 UNIT/ML ~~LOC~~ SOLN
8.0000 [IU] | Freq: Every day | SUBCUTANEOUS | Status: DC
  Administered 2022-09-30: 8 [IU] via SUBCUTANEOUS
  Filled 2022-09-30 (×2): qty 0.08

## 2022-09-30 NOTE — Progress Notes (Signed)
Patients friend M. Strickland called and stated she had been to the patients home and REMOVED 3 pistols and 1 shotgun.

## 2022-09-30 NOTE — BHH Counselor (Signed)
CSW spoke with pt regarding discharge planning. Pt provided verbal permission to speak with pt's friend, [name] regarding SPE and weapons safety.   CSW discussed with pt PT referral recommendations for SNF. Pt stated that he wanted to  "go home" and said he would follow up with [facility] for his knee problems. When asked regarding follow up pt stated he was not interested in referral for aftercare with community mental health.   CSW stated that discharge date would be determined by the provider and encouraged participation in milieu and groups during admission.   Naevia Unterreiner Martinique, MSW, LCSW-A 12/7/20231:16 PM

## 2022-09-30 NOTE — Plan of Care (Signed)
D- Patient alert and oriented. Patient presents with an irritated mood and affect.  Patient denies SI, HI, AVH, but endorses pain. Patient rated pain of a 4 on a scale of 0-10. Patient states that the only goal that he has [facility] as he does not have a need to be here. Writer tried to explain to patient that he was not in prison. Patient very upset, angry, and irritable this morning. MD made aware. Patient tried to ambulate with the walker while Probation officer assisted. Patient was able to make it from his room and half way down the hall before needing to transition to the wheelchair.   A- Scheduled medications administered to patient, per MD orders. Support and encouragement provided.  Routine safety checks conducted every 15 minutes.  Patient informed to notify staff with problems or concerns.  R- No adverse drug reactions noted. Patient contracts for safety at this time. Patient compliant with medications and treatment plan. Patient receptive, calm, and cooperative. Patient interacts well with others on the unit.  Patient remains safe at this time.  Problem: Education: Goal: Knowledge of General Education information will improve Description: Including pain rating scale, medication(s)/side effects and non-pharmacologic comfort measures Outcome: Not Progressing   Problem: Health Behavior/Discharge Planning: Goal: Ability to manage health-related needs will improve Outcome: Not Progressing   Problem: Clinical Measurements: Goal: Ability to maintain clinical measurements within normal limits will improve Outcome: Not Progressing Goal: Diagnostic test results will improve Outcome: Progressing Goal: Respiratory complications will improve Outcome: Progressing Goal: Cardiovascular complication will be avoided Outcome: Progressing   Problem: Activity: Goal: Risk for activity intolerance will decrease Outcome: Not Progressing   Problem: Nutrition: Goal: Adequate nutrition  will be maintained Outcome: Progressing   Problem: Elimination: Goal: Will not experience complications related to bowel motility Outcome: Progressing Goal: Will not experience complications related to urinary retention Outcome: Progressing   Problem: Pain Managment: Goal: General experience of comfort will improve Outcome: Not Progressing   Problem: Skin Integrity: Goal: Risk for impaired skin integrity will decrease Outcome: Progressing

## 2022-09-30 NOTE — Progress Notes (Signed)
[facility] MD Progress Note  09/30/2022 9:22 AM Craig Wagner  MRN:  [id number] Subjective: Craig Wagner is seen on rounds.  He refused Wellbutrin and Zyprexa.  He is very irritable and angry and insisted on leaving.  We had a long discussion on why he is here and how we have to do our job to make sure he is safe when he is discharged.  He has very poor insight.  I asked him to try the medication since he is here.  He denies any suicidality.  Principal Problem: MDD (major depressive disorder), recurrent severe, without psychosis ([place]) Diagnosis: Principal Problem:   MDD (major depressive disorder), recurrent severe, without psychosis ([place])  Total Time spent with patient: 15 minutes  Past Psychiatric History: Unremarkable  Past Medical History:  Past Medical History:  Diagnosis Date   Degenerative joint disease    Diabetes mellitus without complication ([place])    Diverticulitis    Gout    Hypertension    Involuntary commitment 09/25/2022   Irritable bowel syndrome    Sleep apnea     Past Surgical History:  Procedure Laterality Date   BIOPSY  10/06/2020   Procedure: BIOPSY;  Surgeon: Eloise Harman, DO;  Location: [facility];  Service: Endoscopy;;   COLONOSCOPY WITH PROPOFOL N/A 10/06/2020   Colonoscopy with non-bleeding internal hemorrhoids. Many diverticula in entire colon. Localized mild inflammation in sigmoid colon. Colon mucosa with hyperemia, negative inflammation.    compound fx tibia and fibula     WRIST SURGERY Left    Family History:  Family History  Problem Relation Age of Onset   Cancer Mother    Heart disease Father    Colon cancer Neg Hx    Colon polyps Neg Hx    Family Psychiatric  History: Unremarkable Social History:  Social History   Substance and Sexual Activity  Alcohol Use Yes   Alcohol/week: 1.0 standard drink of alcohol   Types: 1 Cans of beer per week     Social History   Substance and Sexual Activity  Drug Use No    Social History    Socioeconomic History   Marital status: Married    Spouse name: Not on file   Number of children: Not on file   Years of education: Not on file   Highest education level: Not on file  Occupational History   Not on file  Tobacco Use   Smoking status: Never   Smokeless tobacco: Never  Vaping Use   Vaping Use: Never used  Substance and Sexual Activity   Alcohol use: Yes    Alcohol/week: 1.0 standard drink of alcohol    Types: 1 Cans of beer per week   Drug use: No   Sexual activity: Not Currently    Partners: Female  Other Topics Concern   Not on file  Social History Narrative   Grew up in [place]    Moved to [place] for school.   Father was a Risk analyst in the [employer].   Works part-time.   Married for 28 years.    Does not have children.   Has a lot of pets.   Takes care of mules, dogs, pets.   Has a motorcycle.       Social Determinants of Health   Financial Resource Strain: Low Risk  (03/28/2018)   Overall Financial Resource Strain (CARDIA)    Difficulty of Paying Living Expenses: Not hard at all  Food Insecurity: No Food Insecurity (09/28/2022)   Hunger Vital Sign  Worried About Charity fundraiser in the Last Year: Never true    [place] [employer] in the Last Year: Never true  Transportation Needs: No Transportation Needs (09/28/2022)   PRAPARE - Hydrologist (Medical): No    Lack of Transportation (Non-Medical): No  Physical Activity: Inactive (03/28/2018)   Exercise Vital Sign    Days of Exercise per Week: 0 days    Minutes of Exercise per Session: 0 min  Stress: No Stress Concern Present (03/28/2018)   [place]    Feeling of Stress : Not at all  Social Connections: Moderately Integrated (03/28/2018)   Social Connection and Isolation Panel [NHANES]    Frequency of Communication with Friends and Family: Once a week    Frequency of Social Gatherings with Friends and Family: Never     Attends Religious Services: More than 4 times per year    Active Member of [employer] or Organizations: Yes    Attends Archivist Meetings: Never    Marital Status: Married   Additional Social History:                         Sleep: Fair  Appetite:  Fair  Current Medications: Current Facility-Administered Medications  Medication Dose Route Frequency Provider Last Rate Last Admin   acetaminophen (TYLENOL) tablet 650 mg  650 mg Oral Q6H PRN Parks Ranger, DO   650 mg at 09/29/22 2142   alum & mag hydroxide-simeth (MAALOX/MYLANTA) 200-200-20 MG/5ML suspension 30 mL  30 mL Oral Q4H PRN Parks Ranger, DO       atorvastatin (LIPITOR) tablet 20 mg  20 mg Oral Daily Parks Ranger, DO   20 mg at 09/30/22 0846   buPROPion (WELLBUTRIN XL) 24 hr tablet 150 mg  150 mg Oral q morning Parks Ranger, DO   150 mg at 09/29/22 8938   dapagliflozin propanediol (FARXIGA) tablet 10 mg  10 mg Oral Daily Parks Ranger, DO   10 mg at 09/30/22 0846   haloperidol (HALDOL) tablet 5 mg  5 mg Oral Q6H PRN Parks Ranger, DO       lisinopril (ZESTRIL) tablet 20 mg  20 mg Oral Daily Parks Ranger, DO   20 mg at 09/30/22 1017   And   hydrochlorothiazide (HYDRODIURIL) tablet 12.5 mg  12.5 mg Oral Daily Parks Ranger, DO   12.5 mg at 09/30/22 0846   insulin aspart (novoLOG) injection 0-9 Units  0-9 Units Subcutaneous TID WC Parks Ranger, DO   3 Units at 09/30/22 0845   LORazepam (ATIVAN) tablet 1 mg  1 mg Oral Q4H PRN Parks Ranger, DO       magnesium hydroxide (MILK OF MAGNESIA) suspension 30 mL  30 mL Oral Daily PRN Parks Ranger, DO       metFORMIN (GLUCOPHAGE-XR) 24 hr tablet 500 mg  500 mg Oral BID WC Parks Ranger, DO   500 mg at 09/30/22 0847   OLANZapine (ZYPREXA) tablet 10 mg  10 mg Oral QHS Parks Ranger, DO       traZODone (DESYREL) tablet 100 mg  100 mg Oral QHS  PRN Parks Ranger, DO   100 mg at 09/28/22 2138    Lab Results:  Results for orders placed or performed during the hospital encounter of 09/28/22 (from the past 48 hour(s))  Glucose, capillary  Status: Abnormal   Collection Time: 09/28/22 12:24 PM  Result Value Ref Range   Glucose-Capillary 269 (H) 70 - 99 mg/dL    Comment: Glucose reference range applies only to samples taken after fasting for at least 8 hours.  Glucose, capillary     Status: Abnormal   Collection Time: 09/28/22  4:28 PM  Result Value Ref Range   Glucose-Capillary 264 (H) 70 - 99 mg/dL    Comment: Glucose reference range applies only to samples taken after fasting for at least 8 hours.  Glucose, capillary     Status: Abnormal   Collection Time: 09/28/22  8:35 PM  Result Value Ref Range   Glucose-Capillary 170 (H) 70 - 99 mg/dL    Comment: Glucose reference range applies only to samples taken after fasting for at least 8 hours.   Comment 1 Notify RN   Glucose, capillary     Status: Abnormal   Collection Time: 09/29/22  8:24 AM  Result Value Ref Range   Glucose-Capillary 298 (H) 70 - 99 mg/dL    Comment: Glucose reference range applies only to samples taken after fasting for at least 8 hours.  Glucose, capillary     Status: Abnormal   Collection Time: 09/29/22 11:24 AM  Result Value Ref Range   Glucose-Capillary 305 (H) 70 - 99 mg/dL    Comment: Glucose reference range applies only to samples taken after fasting for at least 8 hours.   Comment 1 Notify RN   Glucose, capillary     Status: Abnormal   Collection Time: 09/29/22  4:28 PM  Result Value Ref Range   Glucose-Capillary 214 (H) 70 - 99 mg/dL    Comment: Glucose reference range applies only to samples taken after fasting for at least 8 hours.   Comment 1 Notify RN   Glucose, capillary     Status: Abnormal   Collection Time: 09/29/22  8:15 PM  Result Value Ref Range   Glucose-Capillary 166 (H) 70 - 99 mg/dL    Comment: Glucose reference range  applies only to samples taken after fasting for at least 8 hours.  Glucose, capillary     Status: Abnormal   Collection Time: 09/30/22  7:54 AM  Result Value Ref Range   Glucose-Capillary 202 (H) 70 - 99 mg/dL    Comment: Glucose reference range applies only to samples taken after fasting for at least 8 hours.    Blood Alcohol level:  Lab Results  Component Value Date   ETH <10 81/44/8185    Metabolic Disorder Labs: Lab Results  Component Value Date   HGBA1C 6.5 (H) 07/06/2018   MPG 140 07/06/2018   MPG 154 03/28/2018   No results found for: "PROLACTIN" Lab Results  Component Value Date   CHOL 133 07/06/2018   TRIG 300 (H) 07/06/2018   HDL 36 (L) 07/06/2018   CHOLHDL 3.7 07/06/2018   LDLCALC 65 07/06/2018   LDLCALC 156 (H) 03/28/2018    Physical Findings: AIMS:  , ,  ,  ,    CIWA:    COWS:     Musculoskeletal: Strength & Muscle Tone: within normal limits Gait & Station: unsteady Patient leans: Right  Psychiatric Specialty Exam:  Presentation  General Appearance:  Casual  Eye Contact: Fair  Speech: Clear and Coherent; Normal Rate  Speech Volume: Decreased  Handedness: Right   Mood and Affect  Mood: Euthymic  Affect: Non-Congruent; Constricted; Blunt   Thought Process  Thought Processes: Coherent; Linear  Descriptions of Associations:Intact  Orientation:Full (Time, Place and Person)  Thought Content:Logical  History of Schizophrenia/Schizoaffective disorder:No  Duration of Psychotic Symptoms:No data recorded Hallucinations:No data recorded Ideas of Reference:None  Suicidal Thoughts:No data recorded Homicidal Thoughts:No data recorded  Sensorium  Memory: Immediate Good; Recent Good; Remote Good  Judgment: Intact  Insight: Lacking   Executive Functions  Concentration: Fair  Attention Span: Fair  Recall: Good  Fund of Knowledge: Good  Language: Good   Psychomotor Activity  Psychomotor Activity:No data  recorded  Assets  Assets: Communication Skills; Desire for Improvement; Financial Resources/Insurance; Housing   Sleep  Sleep:No data recorded   Physical Exam: Physical Exam Vitals and nursing note reviewed.  Constitutional:      Appearance: Normal appearance. He is normal weight.  Neurological:     General: No focal deficit present.     Mental Status: He is alert and oriented to person, place, and time.  Psychiatric:        Attention and Perception: Attention and perception normal.        Mood and Affect: Mood is anxious. Affect is angry.        Speech: Speech normal.        Behavior: Behavior is agitated.        Thought Content: Thought content normal.        Cognition and Memory: Cognition and memory normal.        Judgment: Judgment is inappropriate.    Review of Systems  Constitutional: Negative.   HENT: Negative.    Eyes: Negative.   Respiratory: Negative.    Cardiovascular: Negative.   Gastrointestinal: Negative.   Genitourinary: Negative.   Musculoskeletal: Negative.   Skin: Negative.   Neurological: Negative.   Endo/Heme/Allergies: Negative.   Psychiatric/Behavioral: Negative.     Blood pressure (!) 141/93, pulse 68, temperature 97.9 F (36.6 C), temperature source Oral, resp. rate 18, height 6' (1.829 m), weight 78.9 kg, SpO2 100 %. Body mass index is 23.6 kg/m.   Treatment Plan Summary: Daily contact with patient to assess and evaluate symptoms and progress in treatment, Medication management, and Plan increase hydrochlorothiazide to 25 mg/day because his blood pressure is running high.  [place], DO 09/30/2022, 9:22 AM

## 2022-09-30 NOTE — Plan of Care (Signed)
Patient alert and cooperative on assessment but guarded.  Patient is medication compliant with encouragement.  Patient denies SI/HI and AVH.  Patient endorses anxiety and depression due to being here, Patient still does not feel he needs to be here.  Patient out of room to dayroom in w/c.  Patient denies pain at this time.  Patient can contract for safety.  Q 15 minute rounds in progress.  Will continue to monitor. Problem: Activity: Goal: Risk for activity intolerance will decrease Outcome: Progressing   Problem: Pain Managment: Goal: General experience of comfort will improve Outcome: Progressing   Problem: Safety: Goal: Ability to remain free from injury will improve Outcome: Progressing   Problem: Education: Goal: Knowledge of General Education information will improve Description: Including pain rating scale, medication(s)/side effects and non-pharmacologic comfort measures Outcome: Not Progressing   Problem: Coping: Goal: Level of anxiety will decrease Outcome: Not Progressing

## 2022-09-30 NOTE — [facility] Suicide Risk Assessment (Signed)
Craig Wagner INPATIENT:  Family/Significant Other Suicide Prevention Education  Suicide Prevention Education:  Education Completed; Craig Wagner, friend, [phone]   has been identified by the patient as the family member/significant other with whom the patient will be residing, and identified as the person(s) who will aid the patient in the event of a mental health crisis (suicidal ideations/suicide attempt).  With written consent from the patient, the family member/significant other has been provided the following suicide prevention education, prior to the and/or following the discharge of the patient.  The suicide prevention education provided includes the following: Suicide risk factors Suicide prevention and interventions National Suicide Hotline telephone number [facility] assessment telephone number [facility] Emergency Assistance [place] and/or Residential Mobile Crisis Unit telephone number  Request made of family/significant other to: Remove weapons (e.g., guns, rifles, knives), all items previously/currently identified as safety concern.   Remove drugs/medications (over-the-counter, prescriptions, illicit drugs), all items previously/currently identified as a safety concern.  The family member/significant other verbalizes understanding of the suicide prevention education information provided.  The family member/significant other agrees to remove the items of safety concern listed above.  CSW spoke with friend regarding pt's firearms in the home. She stated that pt has spoken with her regarding removing weapons from home. She stated that pt wanted her to remove 3 pistols and a long range firearm from his home. CSW stated that pt also provided permission for removal of weapons from home. She stated she would follow up with pt regarding the number of weapons and inform pt of removal once accomplished. No other requests were made. Conversation ended without  incident.   Craig Wagner 09/30/2022, 12:51 PM

## 2022-10-01 DIAGNOSIS — F332 Major depressive disorder, recurrent severe without psychotic features: Secondary | ICD-10-CM | POA: Diagnosis not present

## 2022-10-01 LAB — GLUCOSE, CAPILLARY
Glucose-Capillary: 163 mg/dL — ABNORMAL HIGH (ref 70–99)
Glucose-Capillary: 180 mg/dL — ABNORMAL HIGH (ref 70–99)

## 2022-10-01 MED ORDER — HYDROCHLOROTHIAZIDE 25 MG PO TABS: 25.0000 mg | ORAL_TABLET | Freq: Every day | ORAL | 3 refills | Status: DC

## 2022-10-01 MED ORDER — BUPROPION HCL ER (XL) 150 MG PO TB24: 150.0000 mg | ORAL_TABLET | Freq: Every morning | ORAL | 3 refills | Status: DC

## 2022-10-01 MED ORDER — OLANZAPINE 5 MG PO TABS: 5.0000 mg | ORAL_TABLET | Freq: Every day | ORAL | Status: DC

## 2022-10-01 MED ORDER — INSULIN DETEMIR 100 UNIT/ML ~~LOC~~ SOLN
8.0000 [IU] | Freq: Every day | SUBCUTANEOUS | Status: DC
  Filled 2022-10-01: qty 0.08

## 2022-10-01 MED ORDER — OLANZAPINE 5 MG PO TABS: 5.0000 mg | ORAL_TABLET | Freq: Every day | ORAL | 3 refills | Status: DC

## 2022-10-01 MED ORDER — LISINOPRIL 20 MG PO TABS: 20.0000 mg | ORAL_TABLET | Freq: Every day | ORAL | 3 refills | Status: DC

## 2022-10-01 NOTE — Plan of Care (Signed)
D- Patient alert and oriented. Patient presents with an irritable mood and affect. Patient denies SI, HI, AVH, and pain. Patient states the only goal he has today is to be discharged.   A- Scheduled medications administered to patient, per MD orders. Support and encouragement provided.  Routine safety checks conducted every 15 minutes.  Patient informed to notify staff with problems or concerns.  R- No adverse drug reactions noted. Patient contracts for safety at this time. Patient compliant with medications and treatment plan. Patient receptive, calm, and cooperative. Patient interacts well with others on the unit.  Patient remains safe at this time.  Problem: Education: Goal: Knowledge of General Education information will improve Description: Including pain rating scale, medication(s)/side effects and non-pharmacologic comfort measures Outcome: Not Progressing   Problem: Health Behavior/Discharge Planning: Goal: Ability to manage health-related needs will improve Outcome: Progressing   Problem: Clinical Measurements: Goal: Will remain free from infection Outcome: Progressing Goal: Diagnostic test results will improve Outcome: Progressing Goal: Respiratory complications will improve Outcome: Progressing Goal: Cardiovascular complication will be avoided Outcome: Progressing   Problem: Activity: Goal: Risk for activity intolerance will decrease Outcome: Not Progressing   Problem: Nutrition: Goal: Adequate nutrition will be maintained Outcome: Progressing   Problem: Coping: Goal: Level of anxiety will decrease Outcome: Progressing   Problem: Elimination: Goal: Will not experience complications related to bowel motility Outcome: Progressing Goal: Will not experience complications related to urinary retention Outcome: Progressing   Problem: Pain Managment: Goal: General experience of comfort will improve Outcome: Not Progressing   Problem: Safety: Goal: Ability to remain  free from injury will improve Outcome: Progressing   Problem: Skin Integrity: Goal: Risk for impaired skin integrity will decrease Outcome: Progressing

## 2022-10-01 NOTE — [facility] Counselor (Signed)
CSW contacted pt's friend, [name], [phone] . She confirmed with CSW that yesterday she removed 3 pistols and 1 self defense shot gun as requested by pt from his home.   CSW asked if she felt pt was safe enough to return home. She stated that while she"doesn't want to prevent him from going home" he did need to make his home "de-cluttered" in order to be able to ambulate appropriately.  She stated she would be able to organize and clean pt's home to assist with ambulatory function. From a mental health perspective she said if pt was going to continue the psych medications started in the hospital and with his weapons removed she believes he would be safe at home.   She stated that she worked at the [employer] and [employer] and can assist pt with facilitating appointment with Dr. Mayer Camel and have  an MRI among other medical interventions for pt's current knee issues.   CSW stated that discharge was still pending and that CSW would follow up regarding date once determined by the provider after meeting with the pt.

## 2022-10-01 NOTE — [facility] Counselor (Addendum)
CSW met with pt to discuss discharge. Pt stated that he would be open for CSW to schedule an appointment for therapy for follow up and provided verbal permission to schedule, but that his primary care provider could manage his medications and declined referral for psychiatry aftercare referral. CSW informed pt that he could attend [facility] in [place] if he wanted to get a referral for psychiatry if needed.   Ashaunte Standley [place], MSW, LCSW-A 12/8/202311:27 AM

## 2022-10-01 NOTE — Discharge Summary (Signed)
Physician Discharge Summary Note  Patient:  Craig Wagner is an 66 y.o., male MRN:  [id number] DOB:  April 11, 1956 Patient phone:  [phone] (home)  Patient address:   [address] [place] [address],  Total Time spent with patient: 1 hour  Date of Admission:  09/28/2022 Date of Discharge: 10/01/2022  Reason for Admission:  Craig Wagner is a 66 year old white male who had made suicidal statements while visiting his primary care doctor and subsequently was involuntarily committed in the emergency room where he was transferred to [facility].  He does not have a significant psychiatric history and states that he made that statement under frustration.  He is having a lot of orthopedic problems including walking.  Recent CT of his head on 09/24/2022 shows:Scattered hypodensities within the right cerebellum and bilateral periventricular and subcortical white matter, consistent with age-indeterminate small vessel ischemic change.  He has a history of diabetes and hypertension.  His white count is slightly elevated along with his BUN and creatinine.  GFR is slightly decreased.  Sodium and potassium are slightly low.  AST and ALT are within normal limits.  Supposedly, he has a history of alcohol use.   He has not seen a psychiatrist in 20 years and that is when he was going through marriage counseling.  He denies being suicidal and says that he made stupid statements and now regrets it.  His wife passed away in 08/24/2023 from cancer.  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.  He is very irritable during the interview and insists on leaving.  I discussed medications with him but he states that he is not depressed but he is not euthymic, either.  He presented already on Wellbutrin.  I think he could benefit from a mood stabilizer.  Physical therapy saw him yesterday and recommended treatment and a walker.  No past suicide attempts.  No past psychiatric hospitalizations.  He says that he will see a  therapist if that is what we recommend.  He has very poor insight.  He does not have any children.  His biggest support is [employer].  (Friend).  Principal Problem: MDD (major depressive disorder), recurrent severe, without psychosis ([place]) Discharge Diagnoses: Principal Problem:   MDD (major depressive disorder), recurrent severe, without psychosis ([place])   Past Psychiatric History: Unremarkable  Past Medical History:  Past Medical History:  Diagnosis Date   Degenerative joint disease    Diabetes mellitus without complication ([place])    Diverticulitis    Gout    Hypertension    Involuntary commitment 09/25/2022   Irritable bowel syndrome    Sleep apnea     Past Surgical History:  Procedure Laterality Date   BIOPSY  10/06/2020   Procedure: BIOPSY;  Surgeon: Eloise Harman, DO;  Location: [facility] SUITE;  Service: Endoscopy;;   COLONOSCOPY WITH PROPOFOL N/A 10/06/2020   Colonoscopy with non-bleeding internal hemorrhoids. Many diverticula in entire colon. Localized mild inflammation in sigmoid colon. Colon mucosa with hyperemia, negative inflammation.    compound fx tibia and fibula     WRIST SURGERY Left    Family History:  Family History  Problem Relation Age of Onset   Cancer Mother    Heart disease Father    Colon cancer Neg Hx    Colon polyps Neg Hx    Family Psychiatric  History: Unremarkable Social History:  Social History   Substance and Sexual Activity  Alcohol Use Yes   Alcohol/week: 1.0 standard drink of alcohol  Types: 1 Cans of beer per week     Social History   Substance and Sexual Activity  Drug Use No    Social History   Socioeconomic History   Marital status: Married    Spouse name: Not on file   Number of children: Not on file   Years of education: Not on file   Highest education level: Not on file  Occupational History   Not on file  Tobacco Use   Smoking status: Never   Smokeless tobacco: Never  Vaping Use   Vaping Use: Never  used  Substance and Sexual Activity   Alcohol use: Yes    Alcohol/week: 1.0 standard drink of alcohol    Types: 1 Cans of beer per week   Drug use: No   Sexual activity: Not Currently    Partners: Female  Other Topics Concern   Not on file  Social History Narrative   Grew up in [place]    Moved to [place] for school.   Father was a Risk analyst in the [employer].   Works part-time.   Married for 28 years.    Does not have children.   Has a lot of pets.   Takes care of mules, dogs, pets.   Has a motorcycle.       Social Determinants of Health   Financial Resource Strain: Low Risk  (03/28/2018)   Overall Financial Resource Strain (CARDIA)    Difficulty of Paying Living Expenses: Not hard at all  Food Insecurity: No Food Insecurity (09/28/2022)   Hunger Vital Sign    Worried About Running Out of Food in the Last Year: Never true    Ran Out of Food in the Last Year: Never true  Transportation Needs: No Transportation Needs (09/28/2022)   PRAPARE - Hydrologist (Medical): No    Lack of Transportation (Non-Medical): No  Physical Activity: Inactive (03/28/2018)   Exercise Vital Sign    Days of Exercise per Week: 0 days    Minutes of Exercise per Session: 0 min  Stress: No Stress Concern Present (03/28/2018)   [place]    Feeling of Stress : Not at all  Social Connections: Moderately Integrated (03/28/2018)   Social Connection and Isolation Panel [NHANES]    Frequency of Communication with Friends and Family: Once a week    Frequency of Social Gatherings with Friends and Family: Never    Attends Religious Services: More than 4 times per year    Active Member of [employer] or Organizations: Yes    Attends Archivist Meetings: Never    Marital Status: Married    Hospital Course: This is a 66 year old white male who was involuntarily admitted to geriatric psychiatry on transfer from [facility].   Craig Wagner was at his primary care physician's office and was frustrated with his current treatment as far as his orthopedic concerns and made statements about shooting himself.  The emergency room involuntarily committed him.  He presented very irritable and angry and stated that he did not mean what he said.  He did admit to saying it.  His wife died this past 08-17-2023 but he states that she was sick with cancer and that has nothing to do with him and his statements.  He says he feels fine and regrets making the statement.  At first he refused any medication but then took Zyprexa at bedtime last night and is much more agreeable today.  Social work contacted his good friend Craig Wagner, who told social work that she removed the guns from his house.  He insists that he is not suicidal.  He does not have any past suicide attempts.  The only psychiatric encounter he had was during marriage counseling 20 years ago with his wife that recently passed away, so they must work things out.  He agreed to the medication and [facility]  He remained on his Wellbutrin and his blood pressure medicine was increased from 12.5 mg of hydrochlorothiazide to 25 mg.  On the day of discharge he denied suicidal ideation, homicidal ideation, auditory or visual hallucinations.  His judgment and insight were good.  Physical Findings: AIMS:  , ,  ,  ,    CIWA:    COWS:     Musculoskeletal: Strength & Muscle Tone: within normal limits Gait & Station: unsteady Patient leans: Right   Psychiatric Specialty Exam:  Presentation  General Appearance:  Casual  Eye Contact: Fair  Speech: Clear and Coherent; Normal Rate  Speech Volume: Decreased  Handedness: Right   Mood and Affect  Mood: Euthymic  Affect: Non-Congruent; Constricted; Blunt   Thought Process  Thought Processes: Coherent; Linear  Descriptions of Associations:Intact  Orientation:Full (Time, Place and Person)  Thought  Content:Logical  History of Schizophrenia/Schizoaffective disorder:No  Duration of Psychotic Symptoms:No data recorded Hallucinations:No data recorded Ideas of Reference:None  Suicidal Thoughts:No data recorded Homicidal Thoughts:No data recorded  Sensorium  Memory: Immediate Good; Recent Good; Remote Good  Judgment: Intact  Insight: Lacking   Executive Functions  Concentration: Fair  Attention Span: Fair  Recall: Good  Fund of Knowledge: Good  Language: Good   Psychomotor Activity  Psychomotor Activity:No data recorded  Assets  Assets: Communication Skills; Desire for Improvement; Financial Resources/Insurance; Housing   Sleep  Sleep:No data recorded   Physical Exam: Physical Exam Vitals and nursing note reviewed.  Constitutional:      Appearance: Normal appearance. He is normal weight.  Neurological:     General: No focal deficit present.     Mental Status: He is alert and oriented to person, place, and time.  Psychiatric:        Attention and Perception: Attention and perception normal.        Mood and Affect: Mood and affect normal.        Speech: Speech normal.        Behavior: Behavior normal. Behavior is cooperative.        Thought Content: Thought content normal.        Cognition and Memory: Cognition and memory normal.        Judgment: Judgment normal.    Review of Systems  Constitutional: Negative.   HENT: Negative.    Eyes: Negative.   Respiratory: Negative.    Cardiovascular: Negative.   Gastrointestinal: Negative.   Genitourinary: Negative.   Musculoskeletal: Negative.   Skin: Negative.   Neurological: Negative.   Endo/Heme/Allergies: Negative.   Psychiatric/Behavioral: Negative.     Blood pressure (!) 128/91, pulse 79, temperature 97.9 F (36.6 C), temperature source Oral, resp. rate 18, height 6' (1.829 m), weight 78.9 kg, SpO2 100 %. Body mass index is 23.6 kg/m.   Social History   Tobacco Use  Smoking Status  Never  Smokeless Tobacco Never   Tobacco Cessation:  N/A, patient does not currently use tobacco products   Blood Alcohol level:  Lab Results  Component Value Date   ETH <10 09/24/2022  Metabolic Disorder Labs:  Lab Results  Component Value Date   HGBA1C 6.5 (H) 07/06/2018   MPG 140 07/06/2018   MPG 154 03/28/2018   No results found for: "PROLACTIN" Lab Results  Component Value Date   CHOL 133 07/06/2018   TRIG 300 (H) 07/06/2018   HDL 36 (L) 07/06/2018   CHOLHDL 3.7 07/06/2018   LDLCALC 65 07/06/2018   LDLCALC 156 (H) 03/28/2018    See Psychiatric Specialty Exam and Suicide Risk Assessment completed by Attending Physician prior to discharge.  Discharge destination:  Home  Is patient on multiple antipsychotic therapies at discharge:  No   Has Patient had three or more failed trials of antipsychotic monotherapy by history:  No  Recommended Plan for Multiple Antipsychotic Therapies: NA   Allergies as of 10/01/2022   No Known Allergies      Medication List     TAKE these medications      Indication  atorvastatin 20 MG tablet Commonly known as: LIPITOR TAKE 1 TABLET BY MOUTH ONCE DAILY What changed: Another medication with the same name was removed. Continue taking this medication, and follow the directions you see here.    buPROPion 150 MG 24 hr tablet Commonly known as: WELLBUTRIN XL Take 1 tablet (150 mg total) by mouth every morning. Start taking on: October 02, 2022 What changed:  how much to take Another medication with the same name was removed. Continue taking this medication, and follow the directions you see here.  Indication: Major Depressive Disorder   Farxiga 10 MG Tabs tablet Generic drug: dapagliflozin propanediol Take 1 tablet (10 mg total) by mouth daily.    finasteride 1 MG tablet Commonly known as: PROPECIA Take 1 tablet (1 mg total) by mouth daily.    hydrochlorothiazide 25 MG tablet Commonly known as: HYDRODIURIL Take 1  tablet (25 mg total) by mouth daily. Start taking on: October 02, 2022  Indication: High Blood Pressure Disorder   ibuprofen 200 MG tablet Commonly known as: ADVIL Take 200-400 mg by mouth every 6 (six) hours as needed for mild pain or headache.    lisinopril 20 MG tablet Commonly known as: ZESTRIL Take 1 tablet (20 mg total) by mouth daily. Start taking on: October 02, 2022  Indication: High Blood Pressure Disorder   lisinopril-hydrochlorothiazide 20-12.5 MG tablet Commonly known as: ZESTORETIC Take 1 tablet by mouth daily.    metFORMIN 500 MG 24 hr tablet Commonly known as: GLUCOPHAGE-XR Take 1 tablet (500 mg total) by mouth every evening with a meal for 7 days, THEN 2 tablets (1,000 mg total) 2 (two) times daily. Start taking on: November 14, 2021    OLANZapine 5 MG tablet Commonly known as: ZYPREXA Take 1 tablet (5 mg total) by mouth at bedtime.  Indication: Major Depressive Disorder   sildenafil 20 MG tablet Commonly known as: REVATIO Take 2-5 tablets (40-100 mg total) by mouth 30 minutes before needed.         Follow-up Information     [facility] Follow up on 10/28/2022.   Specialty: Behavioral Health Why: You have a follow up initial appointment for therapy with provider Coralyn Mark (in person) on Thursday, January 4th at [facility]. Please contact their office if you need to reschedule. Thanks! Contact information: [address] [place] [phone]        [facility], [facility] Follow up on 10/04/2022.   Why: Please attend your appointment for medication management on Monday Decemeber 11th  at 8am. Please bring your social security card, proof of insurance, proof of income and ID. There is a temporary address: [address], [place], [place] [address]. Thanks! Contact information: [place] [address] [phone]                 Follow-up  recommendations:  [facility]    Signed: Parks Ranger, DO 10/01/2022, 11:34 AM

## 2022-10-01 NOTE — Plan of Care (Signed)
  Problem: Education: Goal: Knowledge of General Education information will improve Description: Including pain rating scale, medication(s)/side effects and non-pharmacologic comfort measures 10/01/2022 1552 by Luis Abed, RN Outcome: Adequate for Discharge 10/01/2022 1551 by Luis Abed, RN Outcome: Adequate for Discharge 10/01/2022 1106 by Luis Abed, RN Outcome: Not Progressing   Problem: Health Behavior/Discharge Planning: Goal: Ability to manage health-related needs will improve 10/01/2022 1552 by Luis Abed, RN Outcome: Adequate for Discharge 10/01/2022 1551 by Luis Abed, RN Outcome: Adequate for Discharge 10/01/2022 1106 by Luis Abed, RN Outcome: Progressing   Problem: Clinical Measurements: Goal: Ability to maintain clinical measurements within normal limits will improve 10/01/2022 1552 by Luis Abed, RN Outcome: Adequate for Discharge 10/01/2022 1551 by Luis Abed, RN Outcome: Adequate for Discharge Goal: Will remain free from infection 10/01/2022 1552 by Luis Abed, RN Outcome: Adequate for Discharge 10/01/2022 1551 by Luis Abed, RN Outcome: Adequate for Discharge 10/01/2022 1106 by Luis Abed, RN Outcome: Progressing Goal: Diagnostic test results will improve 10/01/2022 1552 by Luis Abed, RN Outcome: Adequate for Discharge 10/01/2022 1551 by Luis Abed, RN Outcome: Adequate for Discharge 10/01/2022 1106 by Luis Abed, RN Outcome: Progressing Goal: Respiratory complications will improve 10/01/2022 1552 by Luis Abed, RN Outcome: Adequate for Discharge 10/01/2022 1551 by Luis Abed, RN Outcome: Adequate for Discharge 10/01/2022 1106 by Luis Abed, RN Outcome: Progressing Goal: Cardiovascular complication will be avoided 10/01/2022 1552 by Luis Abed, RN Outcome: Adequate for Discharge 10/01/2022 1551 by Luis Abed, RN Outcome: Adequate for Discharge 10/01/2022  1106 by Luis Abed, RN Outcome: Progressing   Problem: Nutrition: Goal: Adequate nutrition will be maintained 10/01/2022 1552 by Luis Abed, RN Outcome: Adequate for Discharge 10/01/2022 1551 by Luis Abed, RN Outcome: Adequate for Discharge 10/01/2022 1106 by Luis Abed, RN Outcome: Progressing   Problem: Coping: Goal: Level of anxiety will decrease 10/01/2022 1552 by Luis Abed, RN Outcome: Adequate for Discharge 10/01/2022 1551 by Luis Abed, RN Outcome: Adequate for Discharge 10/01/2022 1106 by Luis Abed, RN Outcome: Progressing   Problem: Elimination: Goal: Will not experience complications related to bowel motility 10/01/2022 1552 by Luis Abed, RN Outcome: Adequate for Discharge 10/01/2022 1551 by Luis Abed, RN Outcome: Adequate for Discharge 10/01/2022 1106 by Luis Abed, RN Outcome: Progressing Goal: Will not experience complications related to urinary retention 10/01/2022 1552 by Luis Abed, RN Outcome: Adequate for Discharge 10/01/2022 1551 by Luis Abed, RN Outcome: Adequate for Discharge 10/01/2022 1106 by Luis Abed, RN Outcome: Progressing   Problem: Pain Managment: Goal: General experience of comfort will improve 10/01/2022 1552 by Luis Abed, RN Outcome: Adequate for Discharge 10/01/2022 1551 by Luis Abed, RN Outcome: Adequate for Discharge 10/01/2022 1106 by Luis Abed, RN Outcome: Not Progressing   Problem: Safety: Goal: Ability to remain free from injury will improve 10/01/2022 1552 by Luis Abed, RN Outcome: Adequate for Discharge 10/01/2022 1551 by Luis Abed, RN Outcome: Adequate for Discharge 10/01/2022 1106 by Luis Abed, RN Outcome: Progressing   Problem: Skin Integrity: Goal: Risk for impaired skin integrity will decrease 10/01/2022 1552 by Luis Abed, RN Outcome: Adequate for Discharge 10/01/2022 1551 by Luis Abed,  RN Outcome: Adequate for Discharge 10/01/2022 1106 by Luis Abed, RN Outcome: Progressing

## 2022-10-01 NOTE — Progress Notes (Signed)
Discharge Note:  Patient denies SI/HI/AVH at this time. Discharge instructions, AVS, prescriptions, and transition record gone over with patient. Patient agrees to comply with medication management, follow-up visit, and outpatient therapy. Patient belongings returned to patient and confirmed. Patient questions and concerns addressed and answered. Patient ambulatory off unit. Patient discharged to home with safe transport.

## 2022-10-01 NOTE — BHH Counselor (Signed)
CSW contacted pt's friend to inform of pt's discharge today. She stated she would be able to meet pt at his home after work and assist pt with medications and groceries if needed.   Thao Bauza [place], MSW, LCSW-A 12/8/20232:02 PM

## 2022-10-01 NOTE — BHH Suicide Risk Assessment (Signed)
[facility] Discharge Suicide Risk Assessment   Principal Problem: MDD (major depressive disorder), recurrent severe, without psychosis ([place]) Discharge Diagnoses: Principal Problem:   MDD (major depressive disorder), recurrent severe, without psychosis ([place])   Total Time spent with patient: 1 hour  Musculoskeletal: Strength & Muscle Tone: within normal limits Gait & Station: unsteady Patient leans: N/A  Psychiatric Specialty Exam  Presentation  General Appearance:  Casual  Eye Contact: Fair  Speech: Clear and Coherent; Normal Rate  Speech Volume: Decreased  Handedness: Right   Mood and Affect  Mood: Euthymic  Duration of Depression Symptoms: No data recorded Affect: Non-Congruent; Constricted; Blunt   Thought Process  Thought Processes: Coherent; Linear  Descriptions of Associations:Intact  Orientation:Full (Time, Place and Person)  Thought Content:Logical  History of Schizophrenia/Schizoaffective disorder:No  Duration of Psychotic Symptoms:No data recorded Hallucinations:No data recorded Ideas of Reference:None  Suicidal Thoughts:No data recorded Homicidal Thoughts:No data recorded  Sensorium  Memory: Immediate Good; Recent Good; Remote Good  Judgment: Intact  Insight: Lacking   Executive Functions  Concentration: Fair  Attention Span: Fair  Recall: Good  Fund of Knowledge: Good  Language: Good   Psychomotor Activity  Psychomotor Activity:No data recorded  Assets  Assets: Communication Skills; Desire for Improvement; Financial Resources/Insurance; Housing   Sleep  Sleep:No data recorded   Blood pressure (!) 128/91, pulse 79, temperature 97.9 F (36.6 C), temperature source Oral, resp. rate 18, height 6' (1.829 m), weight 78.9 kg, SpO2 100 %. Body mass index is 23.6 kg/m.  Mental Status Per Nursing Assessment::   On Admission:  NA  Demographic Factors:  Male, Divorced or widowed, Caucasian, and Living alone  Loss  Factors: Loss of significant relationship and Decline in physical health  Historical Factors: NA  Risk Reduction Factors:   Positive social support, Positive therapeutic relationship, and Positive coping skills or problem solving skills  Continued Clinical Symptoms:  Severe Anxiety and/or Agitation  Cognitive Features That Contribute To Risk:  Closed-mindedness    Suicide Risk:  Mild:  Suicidal ideation of limited frequency, intensity, duration, and specificity.  There are no identifiable plans, no associated intent, mild dysphoria and related symptoms, good self-control (both objective and subjective assessment), few other risk factors, and identifiable protective factors, including available and accessible social support.   Follow-up Information     [facility] Follow up on 10/28/2022.   Specialty: Behavioral Health Why: You have a follow up initial appointment for therapy with provider Coralyn Mark (in person) on Thursday, January 4th at [facility]. Please contact their office if you need to reschedule. Thanks! Contact information: [address] [place] [phone]        [facility] Follow up on 10/04/2022.   Why: Please attend your appointment for medication management on Monday Decemeber 11th at 8am. Please bring your social security card, proof of insurance, proof of income and ID. There is a temporary address: [address] [place], [place] [address]. Thanks! Contact information: [place] [address] [phone]                 Plan Of Care/Follow-up recommendations: Madelin Rear, DO 10/01/2022, 11:25 AM

## 2022-10-01 NOTE — Progress Notes (Signed)
  [facility] Adult Case Management Discharge Plan :  Will you be returning to the same living situation after discharge:  Yes,  pt will be returning home At discharge, do you have transportation home?: Yes,  CSW will assist pt with obtaining transportation Do you have the ability to pay for your medications: Yes,  pt has private insurance  Release of information consent forms completed and in the chart;  Patient's signature needed at discharge.  Patient to Follow up at:  Follow-up Information     [facility] Follow up on 10/28/2022.   Specialty: Behavioral Health Why: You have Craig follow up initial appointment for therapy with provider Coralyn Mark (in person) on Thursday, January 4th at [facility]. Please contact their office if you need to reschedule. Thanks! Contact information: [address] [place] [phone]        [facility] Follow up on 10/04/2022.   Why: Please attend your appointment for medication management on Monday Decemeber 11th at 8am. Please bring your social security card, proof of insurance, proof of income and ID. There is Craig temporary address: [address] [place], [place] [address]. Thanks! Contact information: [place] [address] [phone]                 Next level of care provider has access to [place] and Suicide Prevention discussed: Yes,  SPE completed with pt and pt's friend [name]     Has patient been referred to the Quitline?: N/Craig patient is not Craig smoker  Patient has been referred for addiction treatment: N/Craig  Craig Wagner Craig Wagner, [place] 10/01/2022, 4:21 PM

## 2022-10-05 DIAGNOSIS — M1711 Unilateral primary osteoarthritis, right knee: Secondary | ICD-10-CM | POA: Diagnosis not present

## 2022-10-05 DIAGNOSIS — M79601 Pain in right arm: Secondary | ICD-10-CM | POA: Diagnosis not present

## 2022-10-12 DIAGNOSIS — E119 Type 2 diabetes mellitus without complications: Secondary | ICD-10-CM | POA: Diagnosis not present

## 2022-10-12 DIAGNOSIS — Z8249 Family history of ischemic heart disease and other diseases of the circulatory system: Secondary | ICD-10-CM | POA: Diagnosis not present

## 2022-10-12 DIAGNOSIS — R2689 Other abnormalities of gait and mobility: Secondary | ICD-10-CM | POA: Diagnosis not present

## 2022-10-12 DIAGNOSIS — Z008 Encounter for other general examination: Secondary | ICD-10-CM | POA: Diagnosis not present

## 2022-10-12 DIAGNOSIS — Z809 Family history of malignant neoplasm, unspecified: Secondary | ICD-10-CM | POA: Diagnosis not present

## 2022-10-12 DIAGNOSIS — R69 Illness, unspecified: Secondary | ICD-10-CM | POA: Diagnosis not present

## 2022-10-12 DIAGNOSIS — Z833 Family history of diabetes mellitus: Secondary | ICD-10-CM | POA: Diagnosis not present

## 2022-10-12 DIAGNOSIS — E785 Hyperlipidemia, unspecified: Secondary | ICD-10-CM | POA: Diagnosis not present

## 2022-10-12 DIAGNOSIS — Z7984 Long term (current) use of oral hypoglycemic drugs: Secondary | ICD-10-CM | POA: Diagnosis not present

## 2022-10-12 DIAGNOSIS — Z9181 History of falling: Secondary | ICD-10-CM | POA: Diagnosis not present

## 2022-10-12 DIAGNOSIS — I10 Essential (primary) hypertension: Secondary | ICD-10-CM | POA: Diagnosis not present

## 2022-10-19 DIAGNOSIS — R262 Difficulty in walking, not elsewhere classified: Secondary | ICD-10-CM | POA: Diagnosis not present

## 2022-10-19 DIAGNOSIS — M6281 Muscle weakness (generalized): Secondary | ICD-10-CM | POA: Diagnosis not present

## 2022-10-28 ENCOUNTER — Ambulatory Visit (HOSPITAL_COMMUNITY): Payer: 59 | Admitting: Clinical

## 2022-10-28 ENCOUNTER — Telehealth (HOSPITAL_COMMUNITY): Payer: Self-pay | Admitting: Clinical

## 2022-10-28 NOTE — Telephone Encounter (Signed)
The patient was a no show 

## 2022-11-02 DIAGNOSIS — M1711 Unilateral primary osteoarthritis, right knee: Secondary | ICD-10-CM | POA: Diagnosis not present

## 2022-12-23 ENCOUNTER — Encounter: Payer: Self-pay | Admitting: Radiology

## 2023-03-29 ENCOUNTER — Ambulatory Visit (INDEPENDENT_AMBULATORY_CARE_PROVIDER_SITE_OTHER): Payer: 59 | Admitting: Family Medicine

## 2023-03-29 ENCOUNTER — Encounter: Payer: Self-pay | Admitting: Family Medicine

## 2023-03-29 VITALS — BP 152/108 | HR 95 | Ht 72.0 in | Wt 171.0 lb

## 2023-03-29 DIAGNOSIS — M25562 Pain in left knee: Secondary | ICD-10-CM

## 2023-03-29 DIAGNOSIS — I10 Essential (primary) hypertension: Secondary | ICD-10-CM | POA: Diagnosis not present

## 2023-03-29 DIAGNOSIS — Z13220 Encounter for screening for lipoid disorders: Secondary | ICD-10-CM

## 2023-03-29 DIAGNOSIS — E538 Deficiency of other specified B group vitamins: Secondary | ICD-10-CM | POA: Diagnosis not present

## 2023-03-29 DIAGNOSIS — M25462 Effusion, left knee: Secondary | ICD-10-CM

## 2023-03-29 DIAGNOSIS — E559 Vitamin D deficiency, unspecified: Secondary | ICD-10-CM | POA: Diagnosis not present

## 2023-03-29 DIAGNOSIS — Z7984 Long term (current) use of oral hypoglycemic drugs: Secondary | ICD-10-CM

## 2023-03-29 DIAGNOSIS — Z125 Encounter for screening for malignant neoplasm of prostate: Secondary | ICD-10-CM

## 2023-03-29 DIAGNOSIS — Z1329 Encounter for screening for other suspected endocrine disorder: Secondary | ICD-10-CM | POA: Diagnosis not present

## 2023-03-29 DIAGNOSIS — Z131 Encounter for screening for diabetes mellitus: Secondary | ICD-10-CM | POA: Diagnosis not present

## 2023-03-29 MED ORDER — OLMESARTAN MEDOXOMIL 20 MG PO TABS: 20.0000 mg | ORAL_TABLET | Freq: Every day | ORAL | 1 refills | Status: DC

## 2023-03-29 MED ORDER — PREDNISONE 20 MG PO TABS: 20.0000 mg | ORAL_TABLET | Freq: Two times a day (BID) | ORAL | 0 refills | Status: AC

## 2023-03-29 NOTE — Assessment & Plan Note (Signed)
Vitals:   03/29/23 1422 03/29/23 1425  BP: (!) 173/96 (!) 152/108   Blood pressure not controlled in today's visit Started olmesartan 20 mg Explained non pharmacological interventions such as low salt, DASH diet discussed. Educated on stress reduction and physical activity minimum 150 minutes per week. Discussed signs and symptoms of major cardiovascular event and need to present to the ED. Follow up in 4 weeks or sooner if needed. Patient verbalizes understanding regarding plan of care and all questions answered.

## 2023-03-29 NOTE — Patient Instructions (Addendum)

## 2023-03-29 NOTE — Progress Notes (Signed)
New Patient Office Visit   Subjective   Patient ID: Craig Wagner, male    DOB: Mar 01, 1956  Age: 67 y.o. MRN: [id number]  CC:  Chief Complaint  Patient presents with   Establish Care    Patient is here to establish care. Complains of R arm pain, bilateral knee pain and warm to the touch.     HPI Craig Wagner 67 year old male, presents to the clinic to establish care He  has a past medical history of Degenerative joint disease, Diabetes mellitus without complication (HCC), Diverticulitis, Gout, Hypertension, Involuntary commitment (09/25/2022), Irritable bowel syndrome, and Sleep apnea.  Knee Pain  Left knee pain and swelling occurred 5 to 7 days ago. There was no injury mechanism. The pain is present in the left knee. The quality of the pain is described as burning and aching. The pain is at a severity of 9/10. The pain has been Fluctuating since onset. Associated symptoms include muscle weakness. Pertinent negatives include no inability to bear weight, loss of sensation, numbness or tingling. He reports no foreign bodies present. The symptoms are aggravated by movement. He has tried rest for the symptoms. The treatment provided no relief.        Outpatient Encounter Medications as of 03/29/2023  Medication Sig   olmesartan (BENICAR) 20 MG tablet Take 1 tablet (20 mg total) by mouth daily.   predniSONE (DELTASONE) 20 MG tablet Take 1 tablet (20 mg total) by mouth 2 (two) times daily with a meal for 5 days.   atorvastatin (LIPITOR) 20 MG tablet TAKE 1 TABLET BY MOUTH ONCE DAILY (Patient not taking: Reported on 09/25/2022)   buPROPion (WELLBUTRIN XL) 150 MG 24 hr tablet Take 1 tablet (150 mg total) by mouth every morning. (Patient not taking: Reported on 03/29/2023)   dapagliflozin propanediol (FARXIGA) 10 MG TABS tablet Take 1 tablet (10 mg total) by mouth daily. (Patient not taking: Reported on 03/29/2023)   finasteride (PROPECIA) 1 MG tablet Take 1 tablet (1 mg total) by mouth daily.  (Patient not taking: Reported on 03/29/2023)   hydrochlorothiazide (HYDRODIURIL) 25 MG tablet Take 1 tablet (25 mg total) by mouth daily. (Patient not taking: Reported on 03/29/2023)   ibuprofen (ADVIL) 200 MG tablet Take 200-400 mg by mouth every 6 (six) hours as needed for mild pain or headache. (Patient not taking: Reported on 03/29/2023)   metFORMIN (GLUCOPHAGE-XR) 500 MG 24 hr tablet Take 1 tablet (500 mg total) by mouth every evening with a meal for 7 days, THEN 2 tablets (1,000 mg total) 2 (two) times daily. (Patient not taking: Reported on 09/25/2022)   OLANZapine (ZYPREXA) 5 MG tablet Take 1 tablet (5 mg total) by mouth at bedtime. (Patient not taking: Reported on 03/29/2023)   sildenafil (REVATIO) 20 MG tablet Take 2-5 tablets (40-100 mg total) by mouth 30 minutes before needed. (Patient not taking: Reported on 03/29/2023)   [DISCONTINUED] lisinopril (ZESTRIL) 20 MG tablet Take 1 tablet (20 mg total) by mouth daily. (Patient not taking: Reported on 03/29/2023)   [DISCONTINUED] lisinopril-hydrochlorothiazide (ZESTORETIC) 20-12.5 MG tablet Take 1 tablet by mouth daily. (Patient not taking: Reported on 03/29/2023)   No facility-administered encounter medications on file as of 03/29/2023.    Past Surgical History:  Procedure Laterality Date   BIOPSY  10/06/2020   Procedure: BIOPSY;  Surgeon: Lanelle Bal, DO;  Location: [facility];  Service: Endoscopy;;   COLONOSCOPY WITH PROPOFOL N/A 10/06/2020   Colonoscopy with non-bleeding internal hemorrhoids. Many diverticula in entire colon.  Localized mild inflammation in sigmoid colon. Colon mucosa with hyperemia, negative inflammation.    compound fx tibia and fibula     WRIST SURGERY Left     Review of Systems  Constitutional:  Negative for chills and fever.  HENT:  Negative for ear pain.   Eyes:  Negative for blurred vision.  Respiratory:  Negative for shortness of breath.   Cardiovascular:  Negative for chest pain.  Gastrointestinal:  Negative  for abdominal pain.  Genitourinary:  Negative for dysuria.  Musculoskeletal:  Positive for joint pain and myalgias.  Skin:  Negative for rash.  Neurological:  Negative for dizziness and headaches.      Objective    BP (!) 152/108   Pulse 95   Ht 6' (1.829 m)   Wt 171 lb (77.6 kg)   SpO2 95%   BMI 23.19 kg/m   Physical Exam Vitals reviewed.  Constitutional:      General: He is not in acute distress.    Appearance: Normal appearance. He is not ill-appearing, toxic-appearing or diaphoretic.  HENT:     Head: Normocephalic.  Eyes:     General:        Right eye: No discharge.        Left eye: No discharge.     Conjunctiva/sclera: Conjunctivae normal.  Cardiovascular:     Rate and Rhythm: Normal rate.     Pulses: Normal pulses.     Heart sounds: Normal heart sounds.  Pulmonary:     Effort: Pulmonary effort is normal. No respiratory distress.     Breath sounds: Normal breath sounds.  Abdominal:     General: Bowel sounds are normal.     Palpations: Abdomen is soft.     Tenderness: There is no abdominal tenderness. There is no guarding.  Musculoskeletal:        General: Swelling and tenderness present.     Cervical back: Normal range of motion.     Right knee: No swelling. Decreased range of motion.     Left knee: Swelling present. Decreased range of motion. Tenderness present.  Skin:    General: Skin is warm and dry.     Capillary Refill: Capillary refill takes less than 2 seconds.  Neurological:     General: No focal deficit present.     Mental Status: He is alert and oriented to person, place, and time.     Coordination: Coordination normal.     Gait: Gait normal.  Psychiatric:        Mood and Affect: Mood normal.       Assessment & Plan:  Left knee pain, unspecified chronicity -     Ambulatory referral to Orthopedics -     predniSONE; Take 1 tablet (20 mg total) by mouth 2 (two) times daily with a meal for 5 days.  Dispense: 10 tablet; Refill: 0  Primary  hypertension -     CBC with Differential/Platelet -     CMP14+EGFR -     Microalbumin / creatinine urine ratio -     Olmesartan Medoxomil; Take 1 tablet (20 mg total) by mouth daily.  Dispense: 30 tablet; Refill: 1  Screening for diabetes mellitus -     Hemoglobin A1c  Screening for lipid disorders -     Lipid panel  Screening for thyroid disorder -     TSH + free T4  Vitamin D deficiency -     VITAMIN D 25 Hydroxy (Vit-D Deficiency, Fractures)  Vitamin B12 deficiency -  Vitamin B12  Screening for prostate cancer -     PSA  Pain and swelling of left knee Assessment & Plan: Prednisone 20 mg x 5 days Xray ordered - awaiting results will follow up Advise icing, stretching and modifying activities that cause pain. NSAIDs for pain management. Discussed theThe RICE method: R: Rest the painful area for a few days. I: Ice the area for 20 minutes at a time to relieve inflammation. C: Compress the area with a soft wrap to reduce swelling. E: Elevate the area by putting the foot on a few pillows.    Essential hypertension Assessment & Plan: Vitals:   03/29/23 1422 03/29/23 1425  BP: (!) 173/96 (!) 152/108   Blood pressure not controlled in today's visit Started olmesartan 20 mg Explained non pharmacological interventions such as low salt, DASH diet discussed. Educated on stress reduction and physical activity minimum 150 minutes per week. Discussed signs and symptoms of major cardiovascular event and need to present to the ED. Follow up in 4 weeks or sooner if needed. Patient verbalizes understanding regarding plan of care and all questions answered.      Return in 4 weeks (on 04/26/2023) for re-check blood pressure, hypertension.   Cruzita Lederer Newman Nip, FNP

## 2023-03-29 NOTE — Assessment & Plan Note (Signed)
Prednisone 20 mg x 5 days Xray ordered - awaiting results will follow up Advise icing, stretching and modifying activities that cause pain. NSAIDs for pain management. Discussed theThe RICE method: R: Rest the painful area for a few days. I: Ice the area for 20 minutes at a time to relieve inflammation. C: Compress the area with a soft wrap to reduce swelling. E: Elevate the area by putting the foot on a few pillows.

## 2023-03-31 ENCOUNTER — Other Ambulatory Visit: Payer: Self-pay | Admitting: Family Medicine

## 2023-03-31 LAB — CBC WITH DIFFERENTIAL/PLATELET
Basophils Absolute: 0.1 10*3/uL (ref 0.0–0.2)
Basos: 1 %
EOS (ABSOLUTE): 0 10*3/uL (ref 0.0–0.4)
Eos: 0 %
Hematocrit: 47.4 % (ref 37.5–51.0)
Hemoglobin: 16.3 g/dL (ref 13.0–17.7)
Immature Grans (Abs): 0 10*3/uL (ref 0.0–0.1)
Immature Granulocytes: 0 %
Lymphocytes Absolute: 2 10*3/uL (ref 0.7–3.1)
Lymphs: 24 %
MCH: 31.2 pg (ref 26.6–33.0)
MCHC: 34.4 g/dL (ref 31.5–35.7)
MCV: 91 fL (ref 79–97)
Monocytes Absolute: 0.7 10*3/uL (ref 0.1–0.9)
Monocytes: 8 %
Neutrophils Absolute: 5.5 10*3/uL (ref 1.4–7.0)
Neutrophils: 67 %
Platelets: 171 10*3/uL (ref 150–450)
RBC: 5.22 x10E6/uL (ref 4.14–5.80)
RDW: 12.6 % (ref 11.6–15.4)
WBC: 8.3 10*3/uL (ref 3.4–10.8)

## 2023-03-31 LAB — MICROALBUMIN / CREATININE URINE RATIO
Creatinine, Urine: 308.1 mg/dL
Microalb/Creat Ratio: 191 mg/g creat — ABNORMAL HIGH (ref 0–29)
Microalbumin, Urine: 589.6 ug/mL

## 2023-03-31 LAB — CMP14+EGFR
ALT: 26 IU/L (ref 0–44)
AST: 22 IU/L (ref 0–40)
Albumin/Globulin Ratio: 2 (ref 1.2–2.2)
Albumin: 4.7 g/dL (ref 3.9–4.9)
Alkaline Phosphatase: 116 IU/L (ref 44–121)
BUN/Creatinine Ratio: 11 (ref 10–24)
BUN: 16 mg/dL (ref 8–27)
Bilirubin Total: 1.1 mg/dL (ref 0.0–1.2)
CO2: 21 mmol/L (ref 20–29)
Calcium: 10 mg/dL (ref 8.6–10.2)
Chloride: 97 mmol/L (ref 96–106)
Creatinine, Ser: 1.44 mg/dL — ABNORMAL HIGH (ref 0.76–1.27)
Globulin, Total: 2.4 g/dL (ref 1.5–4.5)
Glucose: 179 mg/dL — ABNORMAL HIGH (ref 70–99)
Potassium: 3.8 mmol/L (ref 3.5–5.2)
Sodium: 139 mmol/L (ref 134–144)
Total Protein: 7.1 g/dL (ref 6.0–8.5)
eGFR: 54 mL/min/{1.73_m2} — ABNORMAL LOW (ref >59)

## 2023-03-31 LAB — LIPID PANEL
Chol/HDL Ratio: 4.1 ratio (ref 0.0–5.0)
Cholesterol, Total: 244 mg/dL — ABNORMAL HIGH (ref 100–199)
HDL: 59 mg/dL (ref >39)
LDL Chol Calc (NIH): 161 mg/dL — ABNORMAL HIGH (ref 0–99)
Triglycerides: 134 mg/dL (ref 0–149)
VLDL Cholesterol Cal: 24 mg/dL (ref 5–40)

## 2023-03-31 LAB — HEMOGLOBIN A1C
Est. average glucose Bld gHb Est-mCnc: 226 mg/dL
Hgb A1c MFr Bld: 9.5 % — ABNORMAL HIGH (ref 4.8–5.6)

## 2023-03-31 LAB — TSH+FREE T4
Free T4: 1.1 ng/dL (ref 0.82–1.77)
TSH: 3.18 u[IU]/mL (ref 0.450–4.500)

## 2023-03-31 LAB — VITAMIN D 25 HYDROXY (VIT D DEFICIENCY, FRACTURES): Vit D, 25-Hydroxy: 11.9 ng/mL — ABNORMAL LOW (ref 30.0–100.0)

## 2023-03-31 LAB — PSA: Prostate Specific Ag, Serum: 2.4 ng/mL (ref 0.0–4.0)

## 2023-03-31 MED ORDER — METFORMIN HCL 1000 MG PO TABS: 1000.0000 mg | ORAL_TABLET | Freq: Two times a day (BID) | ORAL | 3 refills | Status: DC

## 2023-03-31 MED ORDER — ROSUVASTATIN CALCIUM 40 MG PO TABS: 40.0000 mg | ORAL_TABLET | Freq: Every day | ORAL | 3 refills | Status: DC

## 2023-03-31 NOTE — Progress Notes (Signed)
Please inform patient,  Your eGFR levels indicates moderate kidney function, I advise to keep your hypertension controlled, maintain blood pressure reading goals under 130/80, take your daily blood pressure medications. Managing your diabetes with a hemoglobin A1c goal less than 7. Keep your cholesterol under control to prevent further damage to blood vessels. Avoid NSAIDs medications and take tylenol for pain management.Consume a kidney friendly diet which includes Veggies: cauliflower, onions, eggplant, turnips. Low sodium, low to moderate intake of proteins: lean meats (poultry, fish), eggs, unsalted seafood. Avoid fatty foods, limit or avoid smoking and alcohol intake. Maintain an excercise routine to minimum of 150 minuties a week.   *Uncontrolled hypertension and diabetes leads to worsening kidney function can get worse if not treated  Hemoglobin A1c 9.5 indicates type 2 diabetes- Metformin 1000 mg twice daily sent to pharmacy    Cholesterol levels elevated, I advise lifestyle modifications follow diet low in saturated fat, reduce dietary salt intake, avoid fatty foods. Plan of treatment will include Crestor 40 mg daily.   Vitamin D levels low, I advise to taking  over the counter supplements of vitamin D 1000 IU/day to prevent low vitamin D levels. Consuming Vitamin D rich food sources include fish, salmon, sardines, egg yolks, red meat, liver, oranges, soy milk.

## 2023-04-22 ENCOUNTER — Other Ambulatory Visit: Payer: Self-pay | Admitting: Family Medicine

## 2023-04-22 DIAGNOSIS — I10 Essential (primary) hypertension: Secondary | ICD-10-CM

## 2023-04-27 ENCOUNTER — Telehealth: Payer: 59 | Admitting: Family Medicine

## 2023-05-04 ENCOUNTER — Ambulatory Visit: Payer: 59 | Admitting: Family Medicine

## 2023-05-05 ENCOUNTER — Telehealth (INDEPENDENT_AMBULATORY_CARE_PROVIDER_SITE_OTHER): Payer: 59 | Admitting: Family Medicine

## 2023-05-05 ENCOUNTER — Encounter: Payer: Self-pay | Admitting: Family Medicine

## 2023-05-05 ENCOUNTER — Telehealth: Payer: Self-pay | Admitting: Family Medicine

## 2023-05-05 VITALS — BP 150/90 | Ht 72.0 in | Wt 170.0 lb

## 2023-05-05 DIAGNOSIS — I10 Essential (primary) hypertension: Secondary | ICD-10-CM | POA: Diagnosis not present

## 2023-05-05 MED ORDER — GLIMEPIRIDE 1 MG PO TABS: 1.0000 mg | ORAL_TABLET | Freq: Every day | ORAL | 3 refills | Status: DC

## 2023-05-05 MED ORDER — SITAGLIPTIN PHOSPHATE 25 MG PO TABS: 25.0000 mg | ORAL_TABLET | Freq: Every day | ORAL | 3 refills | Status: DC

## 2023-05-05 MED ORDER — OLMESARTAN MEDOXOMIL-HCTZ 40-12.5 MG PO TABS: 1.0000 | ORAL_TABLET | Freq: Every day | ORAL | 1 refills | Status: DC

## 2023-05-05 NOTE — Assessment & Plan Note (Addendum)
Vitals:   05/05/23 1018 05/05/23 1107  BP: (!) 170/100 (!) 150/90  Blood pressure reading done from home Increased medication dosage. Olmesartan-hydrochlorothiazide 40-12.5 mg Continued discussion on DASH diet, low sodium diet and maintain a exercise routine for 150 minutes per week. Follow up in 6 weeks

## 2023-05-05 NOTE — Telephone Encounter (Signed)
Returned patient's call. No answer and VM is full. He can just give readings in message.

## 2023-05-05 NOTE — Telephone Encounter (Signed)
Patient called left voicemail for nurse to return call to give provider bp readings call back  #[phone]

## 2023-05-05 NOTE — Progress Notes (Signed)
Virtual Visit via Video Note  I connected with Craig Wagner on 05/05/23 at 10:20 AM EDT by a video enabled telemedicine application and verified that I am speaking with the correct person using two identifiers.  Patient Location: Home Provider Location: Office/Clinic  I discussed the limitations, risks, security, and privacy concerns of performing an evaluation and management service by video and the availability of in person appointments. I also discussed with the patient that there may be a patient responsible charge related to this service. The patient expressed understanding and agreed to proceed.  Subjective: PCP: Rica Records, FNP  Chief Complaint  Patient presents with   Hypertension    Patient is being seen for HTN and DM f/u. States he checked his BP around 8:30 this am and it was 170/100. He said he will check it again before the visit. He does not know what medications he takes, but he said he stopped taking metformin.    HPI Craig Wagner 67 year old male, presents via telehealth for HTN follow up. For the details of today's visit, please refer to assessment and plan.    ROS: Per HPI  Current Outpatient Medications:    buPROPion (WELLBUTRIN XL) 150 MG 24 hr tablet, Take 1 tablet (150 mg total) by mouth every morning., Disp: 30 tablet, Rfl: 3   glimepiride (AMARYL) 1 MG tablet, Take 1 tablet (1 mg total) by mouth daily with breakfast., Disp: 30 tablet, Rfl: 3   olmesartan-hydrochlorothiazide (BENICAR HCT) 40-12.5 MG tablet, Take 1 tablet by mouth daily., Disp: 30 tablet, Rfl: 1   rosuvastatin (CRESTOR) 40 MG tablet, Take 1 tablet (40 mg total) by mouth daily., Disp: 90 tablet, Rfl: 3   sitaGLIPtin (JANUVIA) 25 MG tablet, Take 1 tablet (25 mg total) by mouth daily., Disp: 30 tablet, Rfl: 3   dapagliflozin propanediol (FARXIGA) 10 MG TABS tablet, Take 1 tablet (10 mg total) by mouth daily. (Patient not taking: Reported on 03/29/2023), Disp: 90 tablet, Rfl: 0    finasteride (PROPECIA) 1 MG tablet, Take 1 tablet (1 mg total) by mouth daily. (Patient not taking: Reported on 03/29/2023), Disp: 90 tablet, Rfl: 1   ibuprofen (ADVIL) 200 MG tablet, Take 200-400 mg by mouth every 6 (six) hours as needed for mild pain or headache. (Patient not taking: Reported on 03/29/2023), Disp: , Rfl:    OLANZapine (ZYPREXA) 5 MG tablet, Take 1 tablet (5 mg total) by mouth at bedtime. (Patient not taking: Reported on 03/29/2023), Disp: 30 tablet, Rfl: 3   sildenafil (REVATIO) 20 MG tablet, Take 2-5 tablets (40-100 mg total) by mouth 30 minutes before needed. (Patient not taking: Reported on 03/29/2023), Disp: 30 tablet, Rfl: 0  Observations/Objective: Today's Vitals   05/05/23 1018 05/05/23 1107  BP: (!) 170/100 (!) 150/90  Weight: 170 lb (77.1 kg)   Height: 6' (1.829 m)    Physical Exam Patient is alert and no acute distress noted.   Assessment and Plan: Essential hypertension Assessment & Plan: Vitals:   05/05/23 1018 05/05/23 1107  BP: (!) 170/100 (!) 150/90  Blood pressure reading done from home Increased medication dosage. Olmesartan-hydrochlorothiazide 40-12.5 mg Continued discussion on DASH diet, low sodium diet and maintain a exercise routine for 150 minutes per week. Follow up in 6 weeks    Other orders -     Olmesartan Medoxomil-HCTZ; Take 1 tablet by mouth daily.  Dispense: 30 tablet; Refill: 1 -     SITagliptin Phosphate; Take 1 tablet (25 mg total) by mouth  daily.  Dispense: 30 tablet; Refill: 3 -     Glimepiride; Take 1 tablet (1 mg total) by mouth daily with breakfast.  Dispense: 30 tablet; Refill: 3    Follow Up Instructions: No follow-ups on file.   I discussed the assessment and treatment plan with the patient. The patient was provided an opportunity to ask questions, and all were answered. The patient agreed with the plan and demonstrated an understanding of the instructions.   The patient was advised to call back or seek an in-person evaluation  if the symptoms worsen or if the condition fails to improve as anticipated.  The above assessment and management plan was discussed with the patient. The patient verbalized understanding of and has agreed to the management plan.   Cruzita Lederer Newman Nip, FNP

## 2023-06-01 ENCOUNTER — Other Ambulatory Visit: Payer: Self-pay | Admitting: Family Medicine

## 2023-06-16 ENCOUNTER — Ambulatory Visit (INDEPENDENT_AMBULATORY_CARE_PROVIDER_SITE_OTHER): Payer: Medicare HMO | Admitting: Family Medicine

## 2023-06-16 ENCOUNTER — Encounter: Payer: Self-pay | Admitting: Family Medicine

## 2023-06-16 VITALS — BP 160/100 | HR 79 | Ht 72.0 in | Wt 176.0 lb

## 2023-06-16 DIAGNOSIS — E612 Magnesium deficiency: Secondary | ICD-10-CM | POA: Diagnosis not present

## 2023-06-16 DIAGNOSIS — M25561 Pain in right knee: Secondary | ICD-10-CM | POA: Diagnosis not present

## 2023-06-16 DIAGNOSIS — I10 Essential (primary) hypertension: Secondary | ICD-10-CM

## 2023-06-16 DIAGNOSIS — G8929 Other chronic pain: Secondary | ICD-10-CM | POA: Diagnosis not present

## 2023-06-16 DIAGNOSIS — E538 Deficiency of other specified B group vitamins: Secondary | ICD-10-CM

## 2023-06-16 MED ORDER — VITAMIN D3 25 MCG (1000 UT) PO CAPS: 1000.0000 [IU] | ORAL_CAPSULE | Freq: Every day | ORAL | 2 refills | Status: AC

## 2023-06-16 MED ORDER — AMLODIPINE BESYLATE 5 MG PO TABS: 5.0000 mg | ORAL_TABLET | Freq: Every day | ORAL | 3 refills | Status: DC

## 2023-06-16 MED ORDER — OLMESARTAN MEDOXOMIL-HCTZ 40-25 MG PO TABS: 1.0000 | ORAL_TABLET | Freq: Every day | ORAL | 3 refills | Status: DC

## 2023-06-16 NOTE — Assessment & Plan Note (Signed)
Vitals:   06/16/23 1022 06/16/23 1040  BP: (!) 162/108 (!) 160/100   Blood pressure not controlled in today's visit Medication compliance is questionable Increased Olmesartan-hydrochlorothiazide 40-25 mg daily Started amlodipine 5 mg daily Follow up in 8 weeks with at home blood pressure readings Continued discussion on DASH diet, low sodium diet and maintain a exercise routine for 150 minutes per week.

## 2023-06-16 NOTE — Progress Notes (Signed)
Patient Office Visit   Subjective   Patient ID: Craig Wagner, male    DOB: [birth year]  Age: 67 y.o. MRN: [id number]  CC:  Chief Complaint  Patient presents with   Follow-up    F/u for Lft. Knee Pain   Diabetes    F/u for Diabetes.   Hypertension    F/u for hypertension   Shoulder Pain    Rt. Shoulder pain. Aches during sleep and when he moves it.    Hand Pain    Rt. Hand soreness in the knuckles, not able to use as before.     HPI Craig Wagner 67 year old male, presents to the clinic for HTN follow up. He  has a past medical history of Degenerative joint disease, Diabetes mellitus without complication (HCC), Diverticulitis, Gout, Hypertension, Involuntary commitment (09/25/2022), Irritable bowel syndrome, and Sleep apnea.For the details of today's visit, please refer to assessment and plan.   HPI    Outpatient Encounter Medications as of 06/16/2023  Medication Sig   amLODipine (NORVASC) 5 MG tablet Take 1 tablet (5 mg total) by mouth daily.   buPROPion (WELLBUTRIN XL) 150 MG 24 hr tablet Take 1 tablet (150 mg total) by mouth every morning.   Cholecalciferol (VITAMIN D3) 25 MCG (1000 UT) CAPS Take 1 capsule (1,000 Units total) by mouth daily.   dapagliflozin propanediol (FARXIGA) 10 MG TABS tablet Take 1 tablet (10 mg total) by mouth daily.   finasteride (PROPECIA) 1 MG tablet Take 1 tablet (1 mg total) by mouth daily.   ibuprofen (ADVIL) 200 MG tablet Take 200-400 mg by mouth every 6 (six) hours as needed for mild pain or headache.   OLANZapine (ZYPREXA) 5 MG tablet Take 1 tablet (5 mg total) by mouth at bedtime.   olmesartan-hydrochlorothiazide (BENICAR HCT) 40-25 MG tablet Take 1 tablet by mouth daily.   sitaGLIPtin (JANUVIA) 25 MG tablet Take 1 tablet (25 mg total) by mouth daily.   [DISCONTINUED] olmesartan-hydrochlorothiazide (BENICAR HCT) 40-12.5 MG tablet TAKE 1 TABLET BY MOUTH EVERY DAY   glimepiride (AMARYL) 1 MG tablet Take 1 tablet (1 mg total) by mouth daily  with breakfast. (Patient not taking: Reported on 06/16/2023)   rosuvastatin (CRESTOR) 40 MG tablet Take 1 tablet (40 mg total) by mouth daily. (Patient not taking: Reported on 06/16/2023)   sildenafil (REVATIO) 20 MG tablet Take 2-5 tablets (40-100 mg total) by mouth 30 minutes before needed. (Patient not taking: Reported on 03/29/2023)   No facility-administered encounter medications on file as of 06/16/2023.    Past Surgical History:  Procedure Laterality Date   BIOPSY  10/06/2020   Procedure: BIOPSY;  Surgeon: Lanelle Bal, DO;  Location: [facility];  Service: Endoscopy;;   COLONOSCOPY WITH PROPOFOL N/A 10/06/2020   Colonoscopy with non-bleeding internal hemorrhoids. Many diverticula in entire colon. Localized mild inflammation in sigmoid colon. Colon mucosa with hyperemia, negative inflammation.    compound fx tibia and fibula     WRIST SURGERY Left     Review of Systems  Constitutional:  Negative for chills and fever.  Eyes:  Negative for blurred vision.  Respiratory:  Negative for shortness of breath.   Cardiovascular:  Negative for chest pain.  Musculoskeletal:  Positive for joint pain and myalgias.  Neurological:  Negative for dizziness.      Objective    BP (!) 160/100 (BP Location: Left Arm, Patient Position: Sitting, Cuff Size: Normal)   Pulse 79   Ht 6' (1.829 m)   Wt 176  lb 0.6 oz (79.9 kg)   SpO2 97%   BMI 23.88 kg/m   Physical Exam Vitals reviewed.  Constitutional:      General: He is not in acute distress.    Appearance: Normal appearance. He is not ill-appearing, toxic-appearing or diaphoretic.  HENT:     Head: Normocephalic.  Eyes:     General:        Right eye: No discharge.        Left eye: No discharge.     Conjunctiva/sclera: Conjunctivae normal.  Cardiovascular:     Rate and Rhythm: Normal rate.     Pulses: Normal pulses.  Pulmonary:     Effort: Pulmonary effort is normal. No respiratory distress.     Breath sounds: Normal breath sounds.   Musculoskeletal:     Cervical back: Normal range of motion.  Skin:    General: Skin is warm and dry.     Capillary Refill: Capillary refill takes less than 2 seconds.  Neurological:     General: No focal deficit present.     Mental Status: He is alert.     Coordination: Coordination abnormal.     Gait: Gait abnormal.  Psychiatric:        Mood and Affect: Mood normal.        Behavior: Behavior normal.       Assessment & Plan:  Primary hypertension  Vitamin B12 deficiency -     Vitamin B12 -     Vitamin B1  Magnesium deficiency -     Magnesium  Chronic pain of right knee -     Ambulatory referral to Orthopedics  Essential hypertension Assessment & Plan: Vitals:   06/16/23 1022 06/16/23 1040  BP: (!) 162/108 (!) 160/100   Blood pressure not controlled in today's visit Medication compliance is questionable Increased Olmesartan-hydrochlorothiazide 40-25 mg daily Started amlodipine 5 mg daily Follow up in 8 weeks with at home blood pressure readings Continued discussion on DASH diet, low sodium diet and maintain a exercise routine for 150 minutes per week.    Other orders -     Olmesartan Medoxomil-HCTZ; Take 1 tablet by mouth daily.  Dispense: 30 tablet; Refill: 3 -     amLODIPine Besylate; Take 1 tablet (5 mg total) by mouth daily.  Dispense: 30 tablet; Refill: 3 -     Vitamin D3; Take 1 capsule (1,000 Units total) by mouth daily.  Dispense: 90 capsule; Refill: 2    Return in about 8 weeks (around 08/11/2023), or if symptoms worsen or fail to improve, for hypertension, type 2 diabetes.   Cruzita Lederer Newman Nip, FNP

## 2023-06-16 NOTE — Patient Instructions (Signed)

## 2023-06-18 LAB — MAGNESIUM: Magnesium: 2.3 mg/dL (ref 1.6–2.3)

## 2023-06-18 LAB — VITAMIN B12: Vitamin B-12: 467 pg/mL (ref 232–1245)

## 2023-06-21 LAB — VITAMIN B1: Thiamine: 158.5 nmol/L (ref 66.5–200.0)

## 2023-08-11 ENCOUNTER — Encounter: Payer: Self-pay | Admitting: Family Medicine

## 2023-08-11 ENCOUNTER — Ambulatory Visit (INDEPENDENT_AMBULATORY_CARE_PROVIDER_SITE_OTHER): Payer: Medicare HMO | Admitting: Family Medicine

## 2023-08-11 VITALS — BP 172/114 | HR 75 | Ht 72.0 in | Wt 180.0 lb

## 2023-08-11 DIAGNOSIS — E1129 Type 2 diabetes mellitus with other diabetic kidney complication: Secondary | ICD-10-CM

## 2023-08-11 DIAGNOSIS — E1122 Type 2 diabetes mellitus with diabetic chronic kidney disease: Secondary | ICD-10-CM | POA: Diagnosis not present

## 2023-08-11 DIAGNOSIS — I10 Essential (primary) hypertension: Secondary | ICD-10-CM | POA: Diagnosis not present

## 2023-08-11 DIAGNOSIS — N1831 Chronic kidney disease, stage 3a: Secondary | ICD-10-CM

## 2023-08-11 MED ORDER — DAPAGLIFLOZIN PROPANEDIOL 10 MG PO TABS: 10.0000 mg | ORAL_TABLET | Freq: Every day | ORAL | 1 refills | Status: DC

## 2023-08-11 MED ORDER — AMLODIPINE BESYLATE 10 MG PO TABS: 10.0000 mg | ORAL_TABLET | Freq: Every day | ORAL | 2 refills | Status: DC

## 2023-08-11 MED ORDER — ROSUVASTATIN CALCIUM 40 MG PO TABS: 40.0000 mg | ORAL_TABLET | Freq: Every day | ORAL | 3 refills | Status: DC

## 2023-08-11 MED ORDER — OLMESARTAN MEDOXOMIL-HCTZ 40-25 MG PO TABS: 1.0000 | ORAL_TABLET | Freq: Every day | ORAL | 3 refills | Status: DC

## 2023-08-11 MED ORDER — CLONIDINE HCL 0.1 MG PO TABS: 0.1000 mg | ORAL_TABLET | Freq: Two times a day (BID) | ORAL | 2 refills | Status: DC | PRN

## 2023-08-11 NOTE — Assessment & Plan Note (Addendum)
Last Hemoglobin A1c: 9.5 Labs: Ordered today, results pending; will follow up accordingly. The patient reports adhering to prescribed medications: Farxiga 10 daily, glimepiride 1 mg daily, Januvia 25 mg daily.  Reviewed non-pharmacological interventions, including a balanced diet rich in lean proteins, healthy fats, whole grains, and high-fiber vegetables. Emphasized reducing refined sugars and processed carbohydrates, and incorporating more fruits, leafy greens, and legumes. Education: Patient was educated on recognizing signs and symptoms of both hypoglycemia and hyperglycemia, and advised to seek emergency care if these symptoms occur. Follow-Up: Scheduled for follow-up in 3-4 months, or sooner if needed. Patient Understanding: The patient verbalized understanding of the care plan, and all questions were answered. Additional Care: Ophthalmology referral was placed. Foot exam results were within normal limits.

## 2023-08-11 NOTE — Patient Instructions (Signed)

## 2023-08-11 NOTE — Progress Notes (Addendum)
Patient Office Visit   Subjective   Patient ID: Craig Wagner, male    DOB: [birth year]  Age: 67 y.o. MRN: [id number]  CC:  Chief Complaint  Patient presents with   Follow-up    8 wk hypertension and diabetes f/u, all medication refills.     HPI Craig Wagner 67 year old male, presents to the clinic for HTN and type 2 diabetes follow up. He  has a past medical history of Degenerative joint disease, Diabetes mellitus without complication (HCC), Diverticulitis, Gout, Hypertension, Involuntary commitment (09/25/2022), Irritable bowel syndrome, and Sleep apnea.For the details of today's visit, please refer to assessment and plan.   HPI    Outpatient Encounter Medications as of 08/11/2023  Medication Sig   amLODipine (NORVASC) 10 MG tablet Take 1 tablet (10 mg total) by mouth daily.   buPROPion (WELLBUTRIN XL) 150 MG 24 hr tablet Take 1 tablet (150 mg total) by mouth every morning.   Cholecalciferol (VITAMIN D3) 25 MCG (1000 UT) CAPS Take 1 capsule (1,000 Units total) by mouth daily.   cloNIDine (CATAPRES) 0.1 MG tablet Take 1 tablet (0.1 mg total) by mouth every 12 (twelve) hours as needed. Take 1 tablet orally if blood pressure above 170/100   finasteride (PROPECIA) 1 MG tablet Take 1 tablet (1 mg total) by mouth daily.   glimepiride (AMARYL) 1 MG tablet Take 1 tablet (1 mg total) by mouth daily with breakfast.   ibuprofen (ADVIL) 200 MG tablet Take 200-400 mg by mouth every 6 (six) hours as needed for mild pain or headache.   OLANZapine (ZYPREXA) 5 MG tablet Take 1 tablet (5 mg total) by mouth at bedtime.   sildenafil (REVATIO) 20 MG tablet Take 2-5 tablets (40-100 mg total) by mouth 30 minutes before needed.   sitaGLIPtin (JANUVIA) 25 MG tablet Take 1 tablet (25 mg total) by mouth daily.   [DISCONTINUED] amLODipine (NORVASC) 5 MG tablet Take 1 tablet (5 mg total) by mouth daily.   [DISCONTINUED] dapagliflozin propanediol (FARXIGA) 10 MG TABS tablet Take 1 tablet (10 mg total) by  mouth daily.   [DISCONTINUED] olmesartan-hydrochlorothiazide (BENICAR HCT) 40-25 MG tablet Take 1 tablet by mouth daily.   [DISCONTINUED] rosuvastatin (CRESTOR) 40 MG tablet Take 1 tablet (40 mg total) by mouth daily.   dapagliflozin propanediol (FARXIGA) 10 MG TABS tablet Take 1 tablet (10 mg total) by mouth daily.   olmesartan-hydrochlorothiazide (BENICAR HCT) 40-25 MG tablet Take 1 tablet by mouth daily.   rosuvastatin (CRESTOR) 40 MG tablet Take 1 tablet (40 mg total) by mouth daily.   No facility-administered encounter medications on file as of 08/11/2023.    Past Surgical History:  Procedure Laterality Date   BIOPSY  10/06/2020   Procedure: BIOPSY;  Surgeon: Lanelle Bal, DO;  [facility];  Service: Endoscopy;;   COLONOSCOPY WITH PROPOFOL N/A 10/06/2020   Colonoscopy with non-bleeding internal hemorrhoids. Many diverticula in entire colon. Localized mild inflammation in sigmoid colon. Colon mucosa with hyperemia, negative inflammation.    compound fx tibia and fibula     WRIST SURGERY Left     Review of Systems  Constitutional:  Negative for chills and fever.  Eyes:  Negative for blurred vision.  Respiratory:  Negative for shortness of breath.   Cardiovascular:  Negative for chest pain.  Gastrointestinal:  Negative for abdominal pain.  Genitourinary:  Negative for dysuria.  Neurological:  Negative for dizziness and headaches.      Objective    BP (!) 172/114  Pulse 75   Ht 6' (1.829 m)   Wt 180 lb (81.6 kg)   SpO2 95%   BMI 24.41 kg/m   Physical Exam Vitals reviewed.  Constitutional:      General: He is not in acute distress.    Appearance: Normal appearance. He is not ill-appearing, toxic-appearing or diaphoretic.  HENT:     Head: Normocephalic.     Right Ear: There is impacted cerumen.  Eyes:     General:        Right eye: No discharge.        Left eye: No discharge.     Conjunctiva/sclera: Conjunctivae normal.  Cardiovascular:     Rate  and Rhythm: Normal rate.     Pulses: Normal pulses.     Heart sounds: Normal heart sounds.  Pulmonary:     Effort: Pulmonary effort is normal. No respiratory distress.     Breath sounds: Normal breath sounds.  Musculoskeletal:     Cervical back: Normal range of motion.  Skin:    General: Skin is warm and dry.  Neurological:     Mental Status: He is alert.  Psychiatric:        Mood and Affect: Mood normal.       Assessment & Plan:  Primary hypertension -     Lipid panel -     BMP8+eGFR -     CBC with Differential/Platelet -     [facility]ulatory referral to [facility]  Type 2 diabetes mellitus with other diabetic kidney complication, without long-term current use of insulin (HCC) -     Hemoglobin A1c  Essential hypertension Assessment & Plan: Vitals:   08/11/23 1411 08/11/23 1427  BP: (!) 169/107 (!) 172/114  Increased Amlodipine 10 mg daily Continue Olmesartan-hydrochlorothiazide 40-25 mg daily  Start Clonidine 0.1 mg PRN if blood pressure above 170/100  Medication compliance is questionable - Explain the importance of medication compliance to reduce the risk of cardiovascular complications  Referral placed to [facility]N Clinic for further evaluation    Labs ordered. Discussed with  patient to monitor their blood pressure regularly and maintain a heart-healthy diet rich in fruits, vegetables, whole grains, and low-fat dairy, while reducing sodium intake to less than 2,300 mg per day. Regular physical activity, such as 30 minutes of moderate exercise most days of the week, will help lower blood pressure and improve overall cardiovascular health. Avoiding smoking, limiting alcohol consumption, and managing stress. Take  prescribed medication, & take it as directed and avoid skipping doses. Seek emergency care if your blood pressure is (over 180/100) or you experience chest pain, shortness of breath, or sudden vision changes.Patient verbalizes understanding regarding plan of  care and all questions answered. Follow up in 2 weeks with at home blood pressure to monitor trends.    Type 2 diabetes mellitus with stage 3a chronic kidney disease, without long-term current use of insulin (HCC) Assessment & Plan: Last Hemoglobin A1c: 9.5 Labs: Ordered today, results pending; will follow up accordingly. The patient reports adhering to prescribed medications: Farxiga 10 daily, glimepiride 1 mg daily, Januvia 25 mg daily.  Reviewed non-pharmacological interventions, including a balanced diet rich in lean proteins, healthy fats, whole grains, and high-fiber vegetables. Emphasized reducing refined sugars and processed carbohydrates, and incorporating more fruits, leafy greens, and legumes. Education: Patient was educated on recognizing signs and symptoms of both hypoglycemia and hyperglycemia, and advised to seek emergency care if these symptoms occur. Follow-Up: Scheduled for follow-up in 3-4 months, or sooner  if needed. Patient Understanding: The patient verbalized understanding of the care plan, and all questions were answered. Additional Care: Ophthalmology referral was placed. Foot exam results were within normal limits.    Other orders -     amLODIPine Besylate; Take 1 tablet (10 mg total) by mouth daily.  Dispense: 30 tablet; Refill: 2 -     Olmesartan Medoxomil-HCTZ; Take 1 tablet by mouth daily.  Dispense: 30 tablet; Refill: 3 -     Dapagliflozin Propanediol; Take 1 tablet (10 mg total) by mouth daily.  Dispense: 90 tablet; Refill: 1 -     Rosuvastatin Calcium; Take 1 tablet (40 mg total) by mouth daily.  Dispense: 90 tablet; Refill: 3 -     cloNIDine HCl; Take 1 tablet (0.1 mg total) by mouth every 12 (twelve) hours as needed. Take 1 tablet orally if blood pressure above 170/100  Dispense: 30 tablet; Refill: 2    Return in about 4 months (around 12/12/2023), or if symptoms worsen or fail to improve, for chronic follow-up.   Cruzita Lederer Newman Nip, FNP

## 2023-08-11 NOTE — Assessment & Plan Note (Signed)
Vitals:   08/11/23 1411 08/11/23 1427  BP: (!) 169/107 (!) 172/114  Increased Amlodipine 10 mg daily Continue Olmesartan-hydrochlorothiazide 40-25 mg daily  Start Clonidine 0.1 mg PRN if blood pressure above 170/100  Medication compliance is questionable - Explain the importance of medication compliance to reduce the risk of cardiovascular complications  Referral placed to HTN Clinic for further evaluation    Labs ordered. Discussed with  patient to monitor their blood pressure regularly and maintain a heart-healthy diet rich in fruits, vegetables, whole grains, and low-fat dairy, while reducing sodium intake to less than 2,300 mg per day. Regular physical activity, such as 30 minutes of moderate exercise most days of the week, will help lower blood pressure and improve overall cardiovascular health. Avoiding smoking, limiting alcohol consumption, and managing stress. Take  prescribed medication, & take it as directed and avoid skipping doses. Seek emergency care if your blood pressure is (over 180/100) or you experience chest pain, shortness of breath, or sudden vision changes.Patient verbalizes understanding regarding plan of care and all questions answered. Follow up in 2 weeks with at home blood pressure to monitor trends.

## 2023-08-12 ENCOUNTER — Other Ambulatory Visit: Payer: Self-pay | Admitting: Family Medicine

## 2023-08-12 LAB — LIPID PANEL
Chol/HDL Ratio: 4.4 {ratio} (ref 0.0–5.0)
Cholesterol, Total: 260 mg/dL — ABNORMAL HIGH (ref 100–199)
HDL: 59 mg/dL (ref >39)
LDL Chol Calc (NIH): 178 mg/dL — ABNORMAL HIGH (ref 0–99)
Triglycerides: 131 mg/dL (ref 0–149)
VLDL Cholesterol Cal: 23 mg/dL (ref 5–40)

## 2023-08-12 LAB — CBC WITH DIFFERENTIAL/PLATELET
Basophils Absolute: 0 10*3/uL (ref 0.0–0.2)
Basos: 0 %
EOS (ABSOLUTE): 0.1 10*3/uL (ref 0.0–0.4)
Eos: 1 %
Hematocrit: 48.6 % (ref 37.5–51.0)
Hemoglobin: 16.9 g/dL (ref 13.0–17.7)
Immature Grans (Abs): 0 10*3/uL (ref 0.0–0.1)
Immature Granulocytes: 0 %
Lymphocytes Absolute: 2.1 10*3/uL (ref 0.7–3.1)
Lymphs: 22 %
MCH: 31.7 pg (ref 26.6–33.0)
MCHC: 34.8 g/dL (ref 31.5–35.7)
MCV: 91 fL (ref 79–97)
Monocytes Absolute: 0.6 10*3/uL (ref 0.1–0.9)
Monocytes: 6 %
Neutrophils Absolute: 6.5 10*3/uL (ref 1.4–7.0)
Neutrophils: 71 %
Platelets: 173 10*3/uL (ref 150–450)
RBC: 5.33 x10E6/uL (ref 4.14–5.80)
RDW: 12.9 % (ref 11.6–15.4)
WBC: 9.3 10*3/uL (ref 3.4–10.8)

## 2023-08-12 LAB — BMP8+EGFR
BUN/Creatinine Ratio: 17 (ref 10–24)
BUN: 24 mg/dL (ref 8–27)
CO2: 20 mmol/L (ref 20–29)
Calcium: 9.7 mg/dL (ref 8.6–10.2)
Chloride: 102 mmol/L (ref 96–106)
Creatinine, Ser: 1.39 mg/dL — ABNORMAL HIGH (ref 0.76–1.27)
Glucose: 232 mg/dL — ABNORMAL HIGH (ref 70–99)
Potassium: 4.3 mmol/L (ref 3.5–5.2)
Sodium: 143 mmol/L (ref 134–144)
eGFR: 56 mL/min/{1.73_m2} — ABNORMAL LOW (ref >59)

## 2023-08-12 LAB — HEMOGLOBIN A1C
Est. average glucose Bld gHb Est-mCnc: 154 mg/dL
Hgb A1c MFr Bld: 7 % — ABNORMAL HIGH (ref 4.8–5.6)

## 2023-08-12 MED ORDER — GLIMEPIRIDE 1 MG PO TABS: 1.0000 mg | ORAL_TABLET | Freq: Every day | ORAL | 3 refills | Status: DC

## 2023-08-12 MED ORDER — ATORVASTATIN CALCIUM 80 MG PO TABS: 80.0000 mg | ORAL_TABLET | Freq: Every day | ORAL | 3 refills | Status: DC

## 2023-08-12 MED ORDER — DAPAGLIFLOZIN PROPANEDIOL 10 MG PO TABS: 10.0000 mg | ORAL_TABLET | Freq: Every day | ORAL | 1 refills | Status: DC

## 2023-08-17 ENCOUNTER — Ambulatory Visit: Payer: Medicare HMO | Admitting: Orthopedic Surgery

## 2023-08-19 ENCOUNTER — Telehealth: Payer: Self-pay | Admitting: Family Medicine

## 2023-08-19 NOTE — Telephone Encounter (Signed)
Patient called all his medicine that was sent to  the wrong pharmacy needs to go   Pharmacy  [facility] [phone] - [place], [place] - [address] [place], [place] [address] Phone: [phone]  Fax: [phone] DEA #: [id number]   Asking please resend all to his pharmacy.

## 2023-08-19 NOTE — Telephone Encounter (Signed)
  Pateint needs to know what medicine he needs to take and what to stop keeps adding but never let him know what to stop call back e [phone]

## 2023-08-22 NOTE — Telephone Encounter (Signed)
Patient did not read mychart messages Please inform patient on labs and medication changes:   Good Job on lowering your Hemoglobin A1c levels! Plan of treatment will include: Farxiga 10 daily, glimepiride 1 mg daily,  Stop taking Januvia 25 mg daily.  Repeat labs in 3-4 months to monitor trends   A Sample Day of Eating for Diabetes or Prediabetes:  Breakfast: Oatmeal with a handful of berries and a sprinkle of chia seeds, paired with a boiled egg.  Lunch: Grilled chicken breast on a bed of spinach and kale, topped with avocado, a drizzle of olive oil, and a slice of whole grain bread.  Snack: A handful of almonds or a small apple with peanut butter.  Dinner: Baked salmon with roasted broccoli and quinoa.  Snack (if needed): A piece of cheese or a small bowl of Greek yogurt.  Find an activity that you will enjoy and start to be active at least 5 days a week for 30 minutes each day.   Your eGFR levels slightly improved since last reading I advise to keep your hypertension controlled, maintain blood pressure reading goals under 130/80, take your daily blood pressure medications. Managing your diabetes with a hemoglobin A1c goal less than 7. Keep your cholesterol under control to prevent further damage to blood vessels. Avoid NSAIDs medications and take tylenol for pain management.Consume a kidney friendly diet which includes Veggies: cauliflower, onions, eggplant, turnips. Low sodium, low to moderate intake of proteins: lean meats (poultry, fish), eggs, unsalted seafood. Avoid fatty foods, limit or avoid smoking and alcohol intake. Maintain an excercise routine to minimum of 150 minuties a week. Follow up in 3-4 months to recheck labs.      Cholesterol levels elevated, Plan of treatment will include : Lipitor 80 mg once daily - Medication sent to your pharmacy.  Stop taking Crestor 40 mg  Your 10-year ASCVD risk score is 43.7%, % meaning you have a  43.7% chance of having a heart  attack or stroke in the next 10 years if no changes are made. This suggests a higher risk, and it's important to take steps to lower it, like improving your diet, exercising, managing blood pressure and cholesterol, Take your daily cholesterol medications as prescribed. By making these changes,  can help reduce your risk and improve your heart health.  Start lifestyle modifications follow diet low in saturated fat, reduce dietary salt intake, avoid fatty foods, maintain an exercise routine 3 to 5 days a week for a minimum total of 150 minutes.

## 2023-08-23 ENCOUNTER — Other Ambulatory Visit: Payer: Self-pay | Admitting: Family Medicine

## 2023-08-23 NOTE — Telephone Encounter (Signed)
lmtrc

## 2023-08-23 NOTE — Telephone Encounter (Signed)
Lmtrc

## 2023-08-23 NOTE — Telephone Encounter (Signed)
Contacted pharmacy, all medications are ready to be picked up at pt. Selected pharmacy. I also called pt. To review medications and lab results there was no answer. Lvm for him to contact the office back.

## 2023-08-23 NOTE — Telephone Encounter (Signed)
Patient returning call.

## 2023-08-23 NOTE — Telephone Encounter (Signed)
Contacted pt about labs and medications there was not an answer. Lvm for him to contact the office back.

## 2023-08-24 ENCOUNTER — Encounter: Payer: Self-pay | Admitting: Orthopedic Surgery

## 2023-08-24 ENCOUNTER — Other Ambulatory Visit: Payer: Self-pay | Admitting: Orthopedic Surgery

## 2023-08-24 ENCOUNTER — Ambulatory Visit: Payer: Medicare HMO | Admitting: Orthopedic Surgery

## 2023-08-24 ENCOUNTER — Ambulatory Visit (INDEPENDENT_AMBULATORY_CARE_PROVIDER_SITE_OTHER): Payer: Medicare HMO

## 2023-08-24 VITALS — BP 150/91 | HR 93 | Ht 72.0 in | Wt 174.0 lb

## 2023-08-24 DIAGNOSIS — G8929 Other chronic pain: Secondary | ICD-10-CM

## 2023-08-24 DIAGNOSIS — M25561 Pain in right knee: Secondary | ICD-10-CM

## 2023-08-24 NOTE — Patient Instructions (Signed)

## 2023-08-24 NOTE — Progress Notes (Addendum)
New Patient Visit  Assessment: Craig Wagner is a 67 y.o. male with the following: 1. Chronic pain of right knee  Plan: Dona Towsend has pain, and is lacking motion in the right knee.  He sustained a fall about a year ago.  Since then, he has continued to struggle.  He is now using a cane.  He is not taking medications.  He has previously had an injection.  His biggest complaint now is dysfunction.  As result, I recommended physical therapy for him to improve his range of motion, and overall strength.  He states understanding.  He would prefer home exercises, as he has a membership to the gym.  Exercises were provided for him today.  If his pain gets worse, he can return to an injection in the right knee.  Radiographs demonstrate some degenerative changes, but there is no end-stage arthritis.  He notes some issues with his balance overall, this may need to be addressed in more detail.  Follow-up: Return if symptoms worsen or fail to improve.  Subjective:  Chief Complaint  Patient presents with   Knee Pain    Right / since fall 10 months ago     History of Present Illness: Craig Wagner is a 67 y.o. male who has been referred by  Rica Records, FNP for evaluation of right knee pain.  He states has had issues with his right knee for almost a year.  He fell, approximately a year ago.  No specific injury.  After the fall, he was using a walker.  He is now progressed to using a cane.  He was evaluated at [facility] orthopedics, and had a steroid injection.  This improved his symptoms, including pain, but he was unable to regain all of his motion.  Currently, he is not having much pain.  He struggles to walk.  He has not worked with physical therapy.  He notes issues with his balance, and occasional numbness and tingling in the right hand.   Review of Systems: No fevers or chills No numbness or tingling No chest pain No shortness of breath No bowel or bladder dysfunction No GI  distress No headaches   Medical History:  Past Medical History:  Diagnosis Date   Degenerative joint disease    Diabetes mellitus without complication (HCC)    Diverticulitis    Gout    Hypertension    Involuntary commitment 09/25/2022   Irritable bowel syndrome    Sleep apnea     Past Surgical History:  Procedure Laterality Date   BIOPSY  10/06/2020   Procedure: BIOPSY;  Surgeon: Lanelle Bal, DO;  [facility];  Service: Endoscopy;;   COLONOSCOPY WITH PROPOFOL N/A 10/06/2020   Colonoscopy with non-bleeding internal hemorrhoids. Many diverticula in entire colon. Localized mild inflammation in sigmoid colon. Colon mucosa with hyperemia, negative inflammation.    compound fx tibia and fibula     WRIST SURGERY Left     Family History  Problem Relation Age of Onset   Cancer Mother    Heart disease Father    Colon cancer Neg Hx    Colon polyps Neg Hx    Social History   Tobacco Use   Smoking status: Never   Smokeless tobacco: Never  Vaping Use   Vaping status: Never Used  Substance Use Topics   Alcohol use: Yes    Alcohol/week: 1.0 standard drink of alcohol    Types: 1 Cans of beer per week   Drug  use: No    No Known Allergies  Objective: BP (!) 150/91   Pulse 93   Ht 6' (1.829 m)   Wt 174 lb (78.9 kg)   BMI 23.60 kg/m   Physical Exam:  General: Alert and oriented. and No acute distress. Gait: Ambulates with the assistance of a cane.  The right knee demonstrates no bruising.  No redness.  Mild swelling is appreciated.  There is atrophy of bilateral thighs, right a little bit more severe than the left.  Range of motion from 10-100 degrees.  Mild tenderness to palpation over the medial joint line.  No increased laxity varus or valgus stress.  Negative Lachman.    IMAGING: I personally ordered and reviewed the following images  X-rays of the right knee were obtained in clinic today.  No acute injuries are noted.  Neutral overall  alignment.  Mild to moderate loss of joint space within the medial lateral compartments.  There are some small osteophytes.  Within the patellofemoral compartment, there are more prominent osteophytes, without noticeable loss of joint space.  Impression: Right knee x-rays without acute injury, mild degenerative changes overall   New Medications:  No orders of the defined types were placed in this encounter.     Oliver Barre, MD  08/24/2023 1:46 PM

## 2023-08-24 NOTE — Progress Notes (Deleted)
New Patient Visit  Assessment: Craig Wagner is a 67 y.o. male with the following: 1. Chronic pain of right knee ***   Plan: Gayleen Orem   Follow-up: No follow-ups on file.  Subjective:  Chief Complaint  Patient presents with   Knee Pain    Right / since fall 10 months ago     History of Present Illness: Angel Diagne is a 67 y.o. male who {Presentation:27320} for evaluation of    Review of Systems: No fevers or chills*** No numbness or tingling No chest pain No shortness of breath No bowel or bladder dysfunction No GI distress No headaches   Medical History:  Past Medical History:  Diagnosis Date   Degenerative joint disease    Diabetes mellitus without complication (HCC)    Diverticulitis    Gout    Hypertension    Involuntary commitment 09/25/2022   Irritable bowel syndrome    Sleep apnea     Past Surgical History:  Procedure Laterality Date   BIOPSY  10/06/2020   Procedure: BIOPSY;  Surgeon: Lanelle Bal, DO;  [facility];  Service: Endoscopy;;   COLONOSCOPY WITH PROPOFOL N/A 10/06/2020   Colonoscopy with non-bleeding internal hemorrhoids. Many diverticula in entire colon. Localized mild inflammation in sigmoid colon. Colon mucosa with hyperemia, negative inflammation.    compound fx tibia and fibula     WRIST SURGERY Left     Family History  Problem Relation Age of Onset   Cancer Mother    Heart disease Father    Colon cancer Neg Hx    Colon polyps Neg Hx    Social History   Tobacco Use   Smoking status: Never   Smokeless tobacco: Never  Vaping Use   Vaping status: Never Used  Substance Use Topics   Alcohol use: Yes    Alcohol/week: 1.0 standard drink of alcohol    Types: 1 Cans of beer per week   Drug use: No    No Known Allergies  No outpatient medications have been marked as taking for the 08/24/23 encounter (Office Visit) with Oliver Barre, MD.    Objective: BP (!) 150/91   Pulse 93   Ht 6'  (1.829 m)   Wt 174 lb (78.9 kg)   BMI 23.60 kg/m   Physical Exam:  General: {General PE Findings:25791} Gait: {Gait:25792}    IMAGING: {XR Reviewed:24899}   New Medications:  No orders of the defined types were placed in this encounter.     Oliver Barre, MD  08/24/2023 1:36 PM

## 2023-08-25 NOTE — Telephone Encounter (Signed)
Thanks

## 2023-09-06 ENCOUNTER — Encounter: Payer: Self-pay | Admitting: Family Medicine

## 2023-09-06 ENCOUNTER — Telehealth: Payer: Self-pay | Admitting: Family Medicine

## 2023-09-06 NOTE — Telephone Encounter (Signed)
Patient is returning a phone call from the office there are no notes on the patient chart I dont know who called patient patient would like a call back regarding this

## 2023-09-07 NOTE — Telephone Encounter (Signed)
Medication Outline:  Cholesterol Medication:  Lipitor 80 mg - Take once daily.  Type 2 Diabetes Medications:  Farxiga 10 mg - Take once daily.  Glimepiride 1 mg - Take once daily with breakfast.  Hypertension Medications:  Amlodipine 10 mg - Take once daily.  Olmesartan-hydrochlorothiazide 40/25 mg - Take once daily.  Clonidine 0.1 mg - Take as needed if blood pressure is above 170/100.

## 2023-09-13 ENCOUNTER — Encounter: Payer: Self-pay | Admitting: Family Medicine

## 2023-09-14 NOTE — Telephone Encounter (Signed)
Please inform patient blood pressure controlled continue with blood pressure medications

## 2023-09-14 NOTE — Telephone Encounter (Signed)
Pt did not want to confirm medications and is disputing that medication list is incorrect.

## 2023-10-24 ENCOUNTER — Encounter (HOSPITAL_BASED_OUTPATIENT_CLINIC_OR_DEPARTMENT_OTHER): Payer: Self-pay

## 2023-11-03 ENCOUNTER — Institutional Professional Consult (permissible substitution) (HOSPITAL_BASED_OUTPATIENT_CLINIC_OR_DEPARTMENT_OTHER): Payer: Medicare HMO | Admitting: Family

## 2023-11-03 DIAGNOSIS — H5213 Myopia, bilateral: Secondary | ICD-10-CM | POA: Diagnosis not present

## 2023-11-03 DIAGNOSIS — H524 Presbyopia: Secondary | ICD-10-CM | POA: Diagnosis not present

## 2023-11-03 DIAGNOSIS — H43813 Vitreous degeneration, bilateral: Secondary | ICD-10-CM | POA: Diagnosis not present

## 2023-11-03 DIAGNOSIS — E119 Type 2 diabetes mellitus without complications: Secondary | ICD-10-CM | POA: Diagnosis not present

## 2023-11-03 DIAGNOSIS — H1789 Other corneal scars and opacities: Secondary | ICD-10-CM | POA: Diagnosis not present

## 2023-11-03 DIAGNOSIS — H25013 Cortical age-related cataract, bilateral: Secondary | ICD-10-CM | POA: Diagnosis not present

## 2023-11-03 DIAGNOSIS — H52203 Unspecified astigmatism, bilateral: Secondary | ICD-10-CM | POA: Diagnosis not present

## 2023-11-03 DIAGNOSIS — H2513 Age-related nuclear cataract, bilateral: Secondary | ICD-10-CM | POA: Diagnosis not present

## 2023-11-03 LAB — HM DIABETES EYE EXAM

## 2023-11-10 ENCOUNTER — Encounter (HOSPITAL_BASED_OUTPATIENT_CLINIC_OR_DEPARTMENT_OTHER): Payer: Self-pay

## 2024-06-06 ENCOUNTER — Ambulatory Visit

## 2024-06-11 ENCOUNTER — Encounter (HOSPITAL_BASED_OUTPATIENT_CLINIC_OR_DEPARTMENT_OTHER): Payer: Self-pay

## 2024-06-11 ENCOUNTER — Ambulatory Visit: Payer: Self-pay

## 2024-06-11 NOTE — Telephone Encounter (Signed)
 FYI Only or Action Required?: FYI only for provider.  Patient was last seen in primary care on 08/11/2023 by Terry Wilhelmena Lloyd Hilario, FNP.  Called Nurse Triage reporting Difficulty Walking.  Symptoms began ongoing, worse over the last week.  Symptoms are: gradually worsening.  Triage Disposition: See Physician Within 24 Hours  Patient/caregiver understands and will follow disposition?:        Copied from CRM #[id number]. Topic: Clinical - Red Word Triage >> Jun 11, 2024  1:25 PM [place] E wrote: Reason for CRM: Off-balance and difficulty walking. Patient stated the difficulty walking has been going on for a year, but the off-balance feeling has been worsening over the past week.         Reason for Disposition  Nursing judgment or information in reference  Answer Assessment - Initial Assessment Questions Patient denies any dizziness with his difficulty walking.     1. REASON FOR CALL: What is your main concern right now?     Difficulty walking and feeling off balance  2. ONSET: When did the difficulty walking start?     Ongoing problem, worse over the last week 3. SEVERITY: How bad is the difficulty walking?     Moderate  4. FUNCTIONAL IMPAIRMENT: How have things been going for you overall? Have you had more difficulty than usual doing your normal daily activities? (e.g., self-care, school, work, interactions)     Yes 5. RELIEVING AND AGGRAVATING FACTORS: What makes it better or worse? (e.g., certain activities, rest)     No 6. FEVER: Do you have a fever?     No 7. OTHER SYMPTOMS: Do you have any other new symptoms?     No  Protocols used: No Guideline Available-[facility]

## 2024-06-11 NOTE — Telephone Encounter (Signed)
 Call disconnected and attempted to call back; no answer   Copied from CRM #[id number]. Topic: Clinical - Red Word Triage >> Jun 11, 2024  1:25 PM Craig Wagner wrote: Reason for CRM: Off-balance and difficulty walking. Patient stated the difficulty walking has been going on for a year, but the off-balance feeling has been worsening over the past week.

## 2024-06-12 ENCOUNTER — Encounter: Payer: Self-pay | Admitting: Internal Medicine

## 2024-06-12 ENCOUNTER — Ambulatory Visit (INDEPENDENT_AMBULATORY_CARE_PROVIDER_SITE_OTHER): Payer: Self-pay | Admitting: Internal Medicine

## 2024-06-12 VITALS — BP 150/92 | HR 103 | Ht 72.0 in | Wt 170.8 lb

## 2024-06-12 DIAGNOSIS — I10 Essential (primary) hypertension: Secondary | ICD-10-CM | POA: Diagnosis not present

## 2024-06-12 DIAGNOSIS — R269 Unspecified abnormalities of gait and mobility: Secondary | ICD-10-CM | POA: Diagnosis not present

## 2024-06-12 DIAGNOSIS — E1129 Type 2 diabetes mellitus with other diabetic kidney complication: Secondary | ICD-10-CM | POA: Diagnosis not present

## 2024-06-12 DIAGNOSIS — N1831 Chronic kidney disease, stage 3a: Secondary | ICD-10-CM

## 2024-06-12 DIAGNOSIS — E1122 Type 2 diabetes mellitus with diabetic chronic kidney disease: Secondary | ICD-10-CM | POA: Diagnosis not present

## 2024-06-12 DIAGNOSIS — Z7984 Long term (current) use of oral hypoglycemic drugs: Secondary | ICD-10-CM

## 2024-06-12 DIAGNOSIS — F5101 Primary insomnia: Secondary | ICD-10-CM

## 2024-06-12 DIAGNOSIS — E782 Mixed hyperlipidemia: Secondary | ICD-10-CM | POA: Diagnosis not present

## 2024-06-12 DIAGNOSIS — E538 Deficiency of other specified B group vitamins: Secondary | ICD-10-CM

## 2024-06-12 MED ORDER — DAPAGLIFLOZIN PROPANEDIOL 10 MG PO TABS: 10.0000 mg | ORAL_TABLET | Freq: Every day | ORAL | 1 refills | Status: AC

## 2024-06-12 MED ORDER — GLIMEPIRIDE 1 MG PO TABS: 1.0000 mg | ORAL_TABLET | Freq: Every day | ORAL | 2 refills | Status: DC

## 2024-06-12 MED ORDER — OLMESARTAN MEDOXOMIL-HCTZ 40-25 MG PO TABS: 1.0000 | ORAL_TABLET | Freq: Every day | ORAL | 3 refills | Status: DC

## 2024-06-12 MED ORDER — ATORVASTATIN CALCIUM 80 MG PO TABS: 80.0000 mg | ORAL_TABLET | Freq: Every day | ORAL | 3 refills | Status: DC

## 2024-06-12 MED ORDER — TRAZODONE HCL 50 MG PO TABS: 25.0000 mg | ORAL_TABLET | Freq: Every evening | ORAL | 3 refills | Status: DC | PRN

## 2024-06-12 MED ORDER — AMLODIPINE BESYLATE 10 MG PO TABS: 10.0000 mg | ORAL_TABLET | Freq: Every day | ORAL | 2 refills | Status: DC

## 2024-06-12 NOTE — Assessment & Plan Note (Addendum)
 Likely multifactorial, including OA of knee and ankle, diabetic neuropathy Uncontrolled HTN likely contributing to headache/heaviness Check CBC, CMP, TSH, B12 If persistent symptoms after starting medicines, will consider MRI brain to rule out structural etiology

## 2024-06-12 NOTE — Assessment & Plan Note (Signed)
 Lab Results  Component Value Date   HGBA1C 7.0 (H) 08/11/2023   Uncontrolled due to noncompliance Does not check blood glucose, but has glucometer at home Restart glimepiride  1 mg QD and Farxiga  10 mg QD Advised to follow diabetic diet On statin and ARB, needs to be compliant F/u CMP and lipid panel Diabetic eye exam: Advised to follow up with Ophthalmology for diabetic eye exam

## 2024-06-12 NOTE — Assessment & Plan Note (Signed)
 Restart atorvastatin  80 mg QD Check lipid profile

## 2024-06-12 NOTE — Progress Notes (Signed)
 Acute Office Visit  Subjective:    Patient ID: Craig Wagner, male    DOB: June 20, 1956, 68 y.o.   MRN: [id number]  Chief Complaint  Patient presents with   Dizziness    Pt reports sx of feeling off balance, no dizziness. Worsened since last week.    Insomnia    Has trouble sleeping    HPI Patient is in today for complaint of feeling off balance and heaviness in the head.  Of note, his blood pressure was elevated today.  He has stopped taking his medications currently.  He was taking amlodipine  10 mg QD and olmesartan -HCTZ 40-25 mg QD in the past.  He has not checked his blood glucose recently as well.  He used to take glimepiride  1 mg QD and Farxiga  10 mg QD.  He has history of uncontrolled type II DM in the past.  He is currently using walker for walking support.  Denies any recent fall or injury.  Denies any new numbness or tingling of the UE or LE.  He also reports insomnia - difficulty initiating and maintaining sleep.  He has history of recurrent MDD, according to chart review, and was placed on Wellbutrin  150 mg QD and Zyprexa  5 mg nightly by his psychiatrist in the past.  He has lost follow-up with psychiatrist and does not want to take antidepressants now.  Denies being depressed.  Does not report SI or HI currently.  Past Medical History:  Diagnosis Date   Degenerative joint disease    Diabetes mellitus without complication (HCC)    Diverticulitis    Gout    Hypertension    Involuntary commitment 09/25/2022   Irritable bowel syndrome    Sleep apnea     Past Surgical History:  Procedure Laterality Date   BIOPSY  10/06/2020   Procedure: BIOPSY;  Surgeon: Cindie Carlin POUR, DO;  Location: [facility];  Service: Endoscopy;;   COLONOSCOPY WITH PROPOFOL  N/A 10/06/2020   Colonoscopy with non-bleeding internal hemorrhoids. Many diverticula in entire colon. Localized mild inflammation in sigmoid colon. Colon mucosa with hyperemia, negative inflammation.    compound fx tibia  and fibula     WRIST SURGERY Left     Family History  Problem Relation Age of Onset   Cancer Mother    Heart disease Father    Colon cancer Neg Hx    Colon polyps Neg Hx     Social History   Socioeconomic History   Marital status: Married    Spouse name: Not on file   Number of children: Not on file   Years of education: Not on file   Highest education level: Not on file  Occupational History   Not on file  Tobacco Use   Smoking status: Never   Smokeless tobacco: Never  Vaping Use   Vaping status: Never Used  Substance and Sexual Activity   Alcohol use: Yes    Alcohol/week: 1.0 standard drink of alcohol    Types: 1 Cans of beer per week   Drug use: No   Sexual activity: Not Currently    Partners: Female  Other Topics Concern   Not on file  Social History Narrative   Grew up in [place]    Moved to [place]  for school.   Father was a Stage manager in the [employer].   Works part-time.   Married for 28 years.    Does not have children.   Has a lot of pets.   Takes care of mules, dogs,  pets.   Has a motorcycle.       Social Drivers of Corporate investment banker Strain: Low Risk  (03/28/2018)   Overall Financial Resource Strain (CARDIA)    Difficulty of Paying Living Expenses: Not hard at all  Food Insecurity: No Food Insecurity (09/28/2022)   Hunger Vital Sign    Worried About Running Out of Food in the Last Year: Never true    Ran Out of Food in the Last Year: Never true  Transportation Needs: No Transportation Needs (09/28/2022)   PRAPARE - Administrator, Civil Service (Medical): No    Lack of Transportation (Non-Medical): No  Physical Activity: Inactive (03/28/2018)   Exercise Vital Sign    Days of Exercise per Week: 0 days    Minutes of Exercise per Session: 0 min  Stress: No Stress Concern Present (03/28/2018)   [employer] of Occupational Health - Occupational Stress Questionnaire    Feeling of Stress : Not at all  Social Connections: Moderately  Integrated (03/28/2018)   Social Connection and Isolation Panel    Frequency of Communication with Friends and Family: Once a week    Frequency of Social Gatherings with Friends and Family: Never    Attends Religious Services: More than 4 times per year    Active Member of [employer] or Organizations: Yes    Attends Banker Meetings: Never    Marital Status: Married  Catering manager Violence: Not At Risk (09/28/2022)   Humiliation, Afraid, Rape, and Kick questionnaire    Fear of Current or Ex-Partner: No    Emotionally Abused: No    Physically Abused: No    Sexually Abused: No    Outpatient Medications Prior to Visit  Medication Sig Dispense Refill   Cholecalciferol (VITAMIN D3) 25 MCG (1000 UT) CAPS Take 1 capsule (1,000 Units total) by mouth daily. 90 capsule 2   finasteride  (PROPECIA ) 1 MG tablet Take 1 tablet (1 mg total) by mouth daily. 90 tablet 1   ibuprofen  (ADVIL ) 200 MG tablet Take 200-400 mg by mouth every 6 (six) hours as needed for mild pain or headache.     sildenafil  (REVATIO ) 20 MG tablet Take 2-5 tablets (40-100 mg total) by mouth 30 minutes before needed. 30 tablet 0   amLODipine  (NORVASC ) 10 MG tablet Take 1 tablet (10 mg total) by mouth daily. 30 tablet 2   atorvastatin  (LIPITOR) 80 MG tablet Take 1 tablet (80 mg total) by mouth daily. 90 tablet 3   buPROPion  (WELLBUTRIN  XL) 150 MG 24 hr tablet Take 1 tablet (150 mg total) by mouth every morning. 30 tablet 3   cloNIDine  (CATAPRES ) 0.1 MG tablet TAKE 1 TABLET BY MOUTH EVERY 12 HOURS AS NEEDED. TAKE 1 TABLET ORALLY IF BLOOD PRESSURE ABOVE 170/100 180 tablet 1   dapagliflozin  propanediol (FARXIGA ) 10 MG TABS tablet Take 1 tablet (10 mg total) by mouth daily. 90 tablet 1   glimepiride  (AMARYL ) 1 MG tablet Take 1 tablet (1 mg total) by mouth daily with breakfast. 30 tablet 3   OLANZapine  (ZYPREXA ) 5 MG tablet Take 1 tablet (5 mg total) by mouth at bedtime. 30 tablet 3   olmesartan -hydrochlorothiazide  (BENICAR  HCT)  40-25 MG tablet Take 1 tablet by mouth daily. 30 tablet 3   No facility-administered medications prior to visit.    No Known Allergies  Review of Systems  Constitutional:  Positive for fatigue. Negative for chills and fever.  HENT:  Negative for congestion and sore throat.  Eyes:  Negative for pain and discharge.  Respiratory:  Negative for cough and shortness of breath.   Cardiovascular:  Negative for chest pain and palpitations.  Gastrointestinal:  Negative for diarrhea, nausea and vomiting.  Endocrine: Negative for polydipsia and polyuria.  Genitourinary:  Negative for dysuria and hematuria.  Musculoskeletal:  Positive for gait problem. Negative for neck pain and neck stiffness.  Skin:  Negative for rash.  Neurological:  Positive for weakness and headaches. Negative for numbness.  Psychiatric/Behavioral:  Positive for sleep disturbance. Negative for agitation and behavioral problems.        Objective:    Physical Exam Vitals reviewed.  Constitutional:      General: He is not in acute distress.    Appearance: He is not diaphoretic.  HENT:     Head: Normocephalic and atraumatic.     Nose: Nose normal.     Mouth/Throat:     Mouth: Mucous membranes are moist.  Eyes:     General: No scleral icterus.    Extraocular Movements: Extraocular movements intact.  Cardiovascular:     Rate and Rhythm: Normal rate and regular rhythm.     Heart sounds: Normal heart sounds. No murmur heard. Pulmonary:     Breath sounds: Normal breath sounds. No wheezing or rales.  Musculoskeletal:     Cervical back: Neck supple. No tenderness.     Right lower leg: No edema.     Left lower leg: No edema.  Skin:    General: Skin is warm.     Findings: No rash.  Neurological:     General: No focal deficit present.     Mental Status: He is alert and oriented to person, place, and time.     Motor: Weakness (B/l LE - 4/5) present.  Psychiatric:        Mood and Affect: Mood is anxious.         Behavior: Behavior normal.     BP (!) 150/92 (BP Location: Left Arm)   Pulse (!) 103   Ht 6' (1.829 m)   Wt 170 lb 12.8 oz (77.5 kg)   SpO2 95%   BMI 23.16 kg/m  Wt Readings from Last 3 Encounters:  06/12/24 170 lb 12.8 oz (77.5 kg)  08/24/23 174 lb (78.9 kg)  08/11/23 180 lb (81.6 kg)        Assessment & Plan:   Problem List Items Addressed This Visit       Cardiovascular and Mediastinum   Essential hypertension - Primary   BP Readings from Last 1 Encounters:  06/12/24 (!) 150/92   Uncontrolled due to noncompliance Restart amlodipine  10 mg QD and olmesartan -HCTZ 40-25 mg QD Counseled for compliance with the medications Advised DASH diet and moderate exercise/walking as tolerated      Relevant Medications   amLODipine  (NORVASC ) 10 MG tablet   atorvastatin  (LIPITOR) 80 MG tablet   olmesartan -hydrochlorothiazide  (BENICAR  HCT) 40-25 MG tablet     Endocrine   Type 2 diabetes mellitus with diabetic chronic kidney disease (HCC)   Lab Results  Component Value Date   HGBA1C 7.0 (H) 08/11/2023   Uncontrolled due to noncompliance Does not check blood glucose, but has glucometer at home Restart glimepiride  1 mg QD and Farxiga  10 mg QD Advised to follow diabetic diet On statin and ARB, needs to be compliant F/u CMP and lipid panel Diabetic eye exam: Advised to follow up with Ophthalmology for diabetic eye exam      Relevant Medications   atorvastatin  (  LIPITOR) 80 MG tablet   dapagliflozin  propanediol (FARXIGA ) 10 MG TABS tablet   glimepiride  (AMARYL ) 1 MG tablet   olmesartan -hydrochlorothiazide  (BENICAR  HCT) 40-25 MG tablet   Other Relevant Orders   CMP14+EGFR   Hemoglobin A1c     Other   Primary insomnia   Chart review suggests history of recurrent MDD, but he denies it currently Started trazodone  as needed for insomnia DC'd Wellbutrin  and olanzapine  upon patient request, as he has not taken them for a long time anyways       Relevant Medications    traZODone  (DESYREL ) 50 MG tablet   Other Relevant Orders   CBC with Differential/Platelet   CMP14+EGFR   Gait disturbance   Likely multifactorial, including OA of knee and ankle, diabetic neuropathy Uncontrolled HTN likely contributing to headache/heaviness Check CBC, CMP, TSH, B12 If persistent symptoms after starting medicines, will consider MRI brain to rule out structural etiology      Mixed hyperlipidemia   Restart atorvastatin  80 mg QD Check lipid profile      Relevant Medications   amLODipine  (NORVASC ) 10 MG tablet   atorvastatin  (LIPITOR) 80 MG tablet   olmesartan -hydrochlorothiazide  (BENICAR  HCT) 40-25 MG tablet   Other Relevant Orders   Lipid Profile   Other Visit Diagnoses       B12 deficiency       Relevant Orders   B12        Meds ordered this encounter  Medications   amLODipine  (NORVASC ) 10 MG tablet    Sig: Take 1 tablet (10 mg total) by mouth daily.    Dispense:  30 tablet    Refill:  2   atorvastatin  (LIPITOR) 80 MG tablet    Sig: Take 1 tablet (80 mg total) by mouth daily.    Dispense:  90 tablet    Refill:  3   dapagliflozin  propanediol (FARXIGA ) 10 MG TABS tablet    Sig: Take 1 tablet (10 mg total) by mouth daily.    Dispense:  90 tablet    Refill:  1   glimepiride  (AMARYL ) 1 MG tablet    Sig: Take 1 tablet (1 mg total) by mouth daily with breakfast.    Dispense:  30 tablet    Refill:  2   olmesartan -hydrochlorothiazide  (BENICAR  HCT) 40-25 MG tablet    Sig: Take 1 tablet by mouth daily.    Dispense:  30 tablet    Refill:  3   traZODone  (DESYREL ) 50 MG tablet    Sig: Take 0.5-1 tablets (25-50 mg total) by mouth at bedtime as needed for sleep.    Dispense:  30 tablet    Refill:  3     Ayliana Casciano MARLA Blanch, MD

## 2024-06-12 NOTE — Assessment & Plan Note (Signed)
 Chart review suggests history of recurrent MDD, but he denies it currently Started trazodone  as needed for insomnia DC'd Wellbutrin  and olanzapine  upon patient request, as he has not taken them for a long time anyways

## 2024-06-12 NOTE — Assessment & Plan Note (Signed)
 BP Readings from Last 1 Encounters:  06/12/24 (!) 150/92   Uncontrolled due to noncompliance Restart amlodipine  10 mg QD and olmesartan -HCTZ 40-25 mg QD Counseled for compliance with the medications Advised DASH diet and moderate exercise/walking as tolerated

## 2024-06-12 NOTE — Patient Instructions (Addendum)
 Please start taking Amlodipine  and Olmesartan -HCTZ for blood pressure.  Please start taking Glimepiride  and Farxiga  for diabetes.  Please start taking Atorvastatin  for cholesterol.  Please take Trazodone  as needed for insomnia.  Please follow DASH diet and ambulate as tolerated.

## 2024-06-13 ENCOUNTER — Ambulatory Visit: Payer: Self-pay | Admitting: Internal Medicine

## 2024-06-13 LAB — CBC WITH DIFFERENTIAL/PLATELET
Basophils Absolute: 0 x10E3/uL (ref 0.0–0.2)
Basos: 1 %
EOS (ABSOLUTE): 0.1 x10E3/uL (ref 0.0–0.4)
Eos: 2 %
Hematocrit: 46.3 % (ref 37.5–51.0)
Hemoglobin: 16.1 g/dL (ref 13.0–17.7)
Immature Grans (Abs): 0 x10E3/uL (ref 0.0–0.1)
Immature Granulocytes: 0 %
Lymphocytes Absolute: 2.1 x10E3/uL (ref 0.7–3.1)
Lymphs: 34 %
MCH: 31.7 pg (ref 26.6–33.0)
MCHC: 34.8 g/dL (ref 31.5–35.7)
MCV: 91 fL (ref 79–97)
Monocytes Absolute: 0.5 x10E3/uL (ref 0.1–0.9)
Monocytes: 8 %
Neutrophils Absolute: 3.3 x10E3/uL (ref 1.4–7.0)
Neutrophils: 55 %
Platelets: 158 x10E3/uL (ref 150–450)
RBC: 5.08 x10E6/uL (ref 4.14–5.80)
RDW: 13.1 % (ref 11.6–15.4)
WBC: 6 x10E3/uL (ref 3.4–10.8)

## 2024-06-13 LAB — CMP14+EGFR
ALT: 19 IU/L (ref 0–44)
AST: 21 IU/L (ref 0–40)
Albumin: 4.5 g/dL (ref 3.9–4.9)
Alkaline Phosphatase: 89 IU/L (ref 44–121)
BUN/Creatinine Ratio: 13 (ref 10–24)
BUN: 21 mg/dL (ref 8–27)
Bilirubin Total: 0.8 mg/dL (ref 0.0–1.2)
CO2: 21 mmol/L (ref 20–29)
Calcium: 10 mg/dL (ref 8.6–10.2)
Chloride: 100 mmol/L (ref 96–106)
Creatinine, Ser: 1.6 mg/dL — ABNORMAL HIGH (ref 0.76–1.27)
Globulin, Total: 2.3 g/dL (ref 1.5–4.5)
Glucose: 195 mg/dL — ABNORMAL HIGH (ref 70–99)
Potassium: 4 mmol/L (ref 3.5–5.2)
Sodium: 141 mmol/L (ref 134–144)
Total Protein: 6.8 g/dL (ref 6.0–8.5)
eGFR: 48 mL/min/1.73 — ABNORMAL LOW (ref >59)

## 2024-06-13 LAB — LIPID PANEL
Chol/HDL Ratio: 5.4 ratio — ABNORMAL HIGH (ref 0.0–5.0)
Cholesterol, Total: 254 mg/dL — ABNORMAL HIGH (ref 100–199)
HDL: 47 mg/dL (ref >39)
LDL Chol Calc (NIH): 181 mg/dL — ABNORMAL HIGH (ref 0–99)
Triglycerides: 141 mg/dL (ref 0–149)
VLDL Cholesterol Cal: 26 mg/dL (ref 5–40)

## 2024-06-13 LAB — HEMOGLOBIN A1C
Est. average glucose Bld gHb Est-mCnc: 200 mg/dL
Hgb A1c MFr Bld: 8.6 % — ABNORMAL HIGH (ref 4.8–5.6)

## 2024-06-13 LAB — VITAMIN B12: Vitamin B-12: 684 pg/mL (ref 232–1245)

## 2024-06-27 ENCOUNTER — Ambulatory Visit (INDEPENDENT_AMBULATORY_CARE_PROVIDER_SITE_OTHER): Payer: Self-pay | Admitting: Internal Medicine

## 2024-06-27 ENCOUNTER — Encounter: Payer: Self-pay | Admitting: Internal Medicine

## 2024-06-27 ENCOUNTER — Encounter (INDEPENDENT_AMBULATORY_CARE_PROVIDER_SITE_OTHER): Payer: Self-pay

## 2024-06-27 VITALS — BP 84/64 | HR 89 | Ht 72.0 in | Wt 172.6 lb

## 2024-06-27 DIAGNOSIS — R269 Unspecified abnormalities of gait and mobility: Secondary | ICD-10-CM

## 2024-06-27 DIAGNOSIS — G6289 Other specified polyneuropathies: Secondary | ICD-10-CM | POA: Diagnosis not present

## 2024-06-27 DIAGNOSIS — N1831 Chronic kidney disease, stage 3a: Secondary | ICD-10-CM

## 2024-06-27 DIAGNOSIS — E1122 Type 2 diabetes mellitus with diabetic chronic kidney disease: Secondary | ICD-10-CM | POA: Diagnosis not present

## 2024-06-27 DIAGNOSIS — R079 Chest pain, unspecified: Secondary | ICD-10-CM

## 2024-06-27 DIAGNOSIS — M79671 Pain in right foot: Secondary | ICD-10-CM

## 2024-06-27 DIAGNOSIS — I10 Essential (primary) hypertension: Secondary | ICD-10-CM

## 2024-06-27 DIAGNOSIS — H9313 Tinnitus, bilateral: Secondary | ICD-10-CM | POA: Diagnosis not present

## 2024-06-27 DIAGNOSIS — K2950 Unspecified chronic gastritis without bleeding: Secondary | ICD-10-CM | POA: Diagnosis not present

## 2024-06-27 DIAGNOSIS — G629 Polyneuropathy, unspecified: Secondary | ICD-10-CM | POA: Insufficient documentation

## 2024-06-27 MED ORDER — FAMOTIDINE 20 MG PO TABS: 20.0000 mg | ORAL_TABLET | Freq: Every day | ORAL | 3 refills | Status: AC

## 2024-06-27 NOTE — Assessment & Plan Note (Signed)
 Likely multifactorial, including OA of knee and ankle, diabetic neuropathy Had similar complaints in the last visit, but had stopped taking his medicines Checked CBC, CMP, TSH, B12 from chart Due to persistent symptoms even after starting medicines, referred to Neurology

## 2024-06-27 NOTE — Assessment & Plan Note (Signed)
 His epigastric discomfort could be related to GERD, especially worse after recently starting medications Pepcid  as needed for epigastric pain/gastritis Avoid hot and spicy food

## 2024-06-27 NOTE — Patient Instructions (Addendum)
 Please stop taking Olmesartan -HCTZ.  Please start taking Pepcid .  Please continue to take medications as prescribed.  Please continue to follow low carb diet and ambulate as tolerated.

## 2024-06-27 NOTE — Progress Notes (Signed)
 Acute Office Visit  Subjective:    Patient ID: Craig Wagner, male    DOB: [birth year], 68 y.o.   MRN: [id number]  Chief Complaint  Patient presents with   Dizziness    Pt reports concerns, states he is off balance wants to see a neurologist.     HPI Patient is in today for complaint of feeling off balance and heaviness in the head.  Of note, his blood pressure was low today.  He has started taking his medications since the last visit 2 weeks ago.  He is taking amlodipine  10 mg QD and olmesartan -HCTZ 40-25 mg QD. He also takes glimepiride  1 mg QD and Farxiga  10 mg QD.  He has history of uncontrolled type II DM in the past.  He is currently using walker for walking support.  Denies any recent fall or injury.  He also reports episodes of chest pain/epigastric pain, which are recurrent, dull, lasting for few minutes to hours and nonradiating.  His EKG was unremarkable today.  Of note, he has noticed more symptoms recently since restarting medications, which could be related to gastritis.  He also reports chronic tinnitus, L > R.  Has mild hearing difficulty, but denies ear pain or discharge currently.  He also had insomnia - difficulty initiating and maintaining sleep.  He has history of recurrent MDD, according to chart review, and was placed on Wellbutrin  150 mg QD and Zyprexa  5 mg nightly by his psychiatrist in the past.  He has lost follow-up with psychiatrist and does not want to take antidepressants now.  Denies being depressed.  Does not report SI or HI currently. He has noticed mild improvement with Trazodone .  Past Medical History:  Diagnosis Date   Degenerative joint disease    Diabetes mellitus without complication (HCC)    Diverticulitis    Gout    Hypertension    Involuntary commitment 09/25/2022   Irritable bowel syndrome    Sleep apnea     Past Surgical History:  Procedure Laterality Date   BIOPSY  10/06/2020   Procedure: BIOPSY;  Surgeon: Cindie Carlin POUR, DO;   Location: [facility];  Service: Endoscopy;;   COLONOSCOPY WITH PROPOFOL  N/A 10/06/2020   Colonoscopy with non-bleeding internal hemorrhoids. Many diverticula in entire colon. Localized mild inflammation in sigmoid colon. Colon mucosa with hyperemia, negative inflammation.    compound fx tibia and fibula     WRIST SURGERY Left     Family History  Problem Relation Age of Onset   Cancer Mother    Heart disease Father    Colon cancer Neg Hx    Colon polyps Neg Hx     Social History   Socioeconomic History   Marital status: Married    Spouse name: Not on file   Number of children: Not on file   Years of education: Not on file   Highest education level: Not on file  Occupational History   Not on file  Tobacco Use   Smoking status: Never   Smokeless tobacco: Never  Vaping Use   Vaping status: Never Used  Substance and Sexual Activity   Alcohol use: Yes    Alcohol/week: 1.0 standard drink of alcohol    Types: 1 Cans of beer per week   Drug use: No   Sexual activity: Not Currently    Partners: Female  Other Topics Concern   Not on file  Social History Narrative   Grew up in [place]    Moved to [place]   for school.   Father was a Stage manager in the [employer].   Works part-time.   Married for 28 years.    Does not have children.   Has a lot of pets.   Takes care of mules, dogs, pets.   Has a motorcycle.       Social Drivers of Corporate investment banker Strain: Low Risk  (03/28/2018)   Overall Financial Resource Strain (CARDIA)    Difficulty of Paying Living Expenses: Not hard at all  Food Insecurity: No Food Insecurity (09/28/2022)   Hunger Vital Sign    Worried About Running Out of Food in the Last Year: Never true    Ran Out of Food in the Last Year: Never true  Transportation Needs: No Transportation Needs (09/28/2022)   PRAPARE - Administrator, Civil Service (Medical): No    Lack of Transportation (Non-Medical): No  Physical Activity: Inactive (03/28/2018)    Exercise Vital Sign    Days of Exercise per Week: 0 days    Minutes of Exercise per Session: 0 min  Stress: No Stress Concern Present (03/28/2018)   [employer] of Occupational Health - Occupational Stress Questionnaire    Feeling of Stress : Not at all  Social Connections: Moderately Integrated (03/28/2018)   Social Connection and Isolation Panel    Frequency of Communication with Friends and Family: Once a week    Frequency of Social Gatherings with Friends and Family: Never    Attends Religious Services: More than 4 times per year    Active Member of [employer] or Organizations: Yes    Attends Banker Meetings: Never    Marital Status: Married  Catering manager Violence: Not At Risk (09/28/2022)   Humiliation, Afraid, Rape, and Kick questionnaire    Fear of Current or Ex-Partner: No    Emotionally Abused: No    Physically Abused: No    Sexually Abused: No    Outpatient Medications Prior to Visit  Medication Sig Dispense Refill   amLODipine  (NORVASC ) 10 MG tablet Take 1 tablet (10 mg total) by mouth daily. 30 tablet 2   atorvastatin  (LIPITOR) 80 MG tablet Take 1 tablet (80 mg total) by mouth daily. 90 tablet 3   Cholecalciferol (VITAMIN D3) 25 MCG (1000 UT) CAPS Take 1 capsule (1,000 Units total) by mouth daily. 90 capsule 2   dapagliflozin  propanediol (FARXIGA ) 10 MG TABS tablet Take 1 tablet (10 mg total) by mouth daily. 90 tablet 1   finasteride  (PROPECIA ) 1 MG tablet Take 1 tablet (1 mg total) by mouth daily. 90 tablet 1   glimepiride  (AMARYL ) 1 MG tablet Take 1 tablet (1 mg total) by mouth daily with breakfast. 30 tablet 2   ibuprofen  (ADVIL ) 200 MG tablet Take 200-400 mg by mouth every 6 (six) hours as needed for mild pain or headache.     sildenafil  (REVATIO ) 20 MG tablet Take 2-5 tablets (40-100 mg total) by mouth 30 minutes before needed. 30 tablet 0   traZODone  (DESYREL ) 50 MG tablet Take 0.5-1 tablets (25-50 mg total) by mouth at bedtime as needed for sleep. 30  tablet 3   olmesartan -hydrochlorothiazide  (BENICAR  HCT) 40-25 MG tablet Take 1 tablet by mouth daily. 30 tablet 3   No facility-administered medications prior to visit.    No Known Allergies  Review of Systems  Constitutional:  Positive for fatigue. Negative for chills and fever.  HENT:  Positive for tinnitus. Negative for congestion and sore throat.   Eyes:  Negative  for pain and discharge.  Respiratory:  Negative for cough and shortness of breath.   Cardiovascular:  Negative for chest pain and palpitations.  Gastrointestinal:  Negative for diarrhea, nausea and vomiting.  Endocrine: Negative for polydipsia and polyuria.  Genitourinary:  Negative for dysuria and hematuria.  Musculoskeletal:  Positive for gait problem. Negative for neck pain and neck stiffness.  Skin:  Negative for rash.  Neurological:  Positive for weakness and headaches. Negative for numbness.  Psychiatric/Behavioral:  Positive for sleep disturbance. Negative for agitation and behavioral problems.        Objective:    Physical Exam Vitals reviewed.  Constitutional:      General: He is not in acute distress.    Appearance: He is not diaphoretic.  HENT:     Head: Normocephalic and atraumatic.     Right Ear: External ear normal.     Left Ear: External ear normal.     Nose: Nose normal.     Mouth/Throat:     Mouth: Mucous membranes are moist.  Eyes:     General: No scleral icterus.    Extraocular Movements: Extraocular movements intact.  Cardiovascular:     Rate and Rhythm: Normal rate and regular rhythm.     Heart sounds: Normal heart sounds. No murmur heard. Pulmonary:     Breath sounds: Normal breath sounds. No wheezing or rales.  Musculoskeletal:     Cervical back: Neck supple. No tenderness.     Right lower leg: No edema.     Left lower leg: No edema.  Skin:    General: Skin is warm.     Findings: No rash.  Neurological:     General: No focal deficit present.     Mental Status: He is alert and  oriented to person, place, and time.     Motor: Weakness (B/l LE - 4/5) present.  Psychiatric:        Mood and Affect: Mood is anxious.        Behavior: Behavior normal.     BP (!) 84/64 (BP Location: Right Arm)   Pulse 89   Ht 6' (1.829 m)   Wt 172 lb 9.6 oz (78.3 kg)   SpO2 93%   BMI 23.41 kg/m  Wt Readings from Last 3 Encounters:  06/27/24 172 lb 9.6 oz (78.3 kg)  06/12/24 170 lb 12.8 oz (77.5 kg)  08/24/23 174 lb (78.9 kg)        Assessment & Plan:   Problem List Items Addressed This Visit       Cardiovascular and Mediastinum   Essential hypertension   BP Readings from Last 1 Encounters:  06/27/24 (!) 84/64   Was Uncontrolled due to noncompliance in the last visit Had low BP today, DC olmesartan -HCTZ 40-25 mg QD for now, concern for orthostatic hypotension Continue amlodipine  10 mg QD Counseled for compliance with the medications Advised DASH diet and moderate exercise/walking as tolerated      Relevant Orders   EKG 12-Lead (Completed)     Digestive   Chronic gastritis without bleeding   His epigastric discomfort could be related to GERD, especially worse after recently starting medications Pepcid  as needed for epigastric pain/gastritis Avoid hot and spicy food      Relevant Medications   famotidine  (PEPCID ) 20 MG tablet     Endocrine   Type 2 diabetes mellitus with diabetic chronic kidney disease (HCC)   Lab Results  Component Value Date   HGBA1C 8.6 (H) 06/12/2024   Uncontrolled  due to noncompliance Does not check blood glucose, but has glucometer at home Restarted glimepiride  1 mg QD and Farxiga  10 mg QD recently Advised to follow diabetic diet On statin and ARB, needs to be compliant F/u CMP and lipid panel Diabetic eye exam: Advised to follow up with Ophthalmology for diabetic eye exam        Nervous and Auditory   Peripheral neuropathy   Has gait disturbance and chronic weakness of b/l LE Could be peripheral neuropathy Referred to  Neurology for further evaluation        Other   Gait disturbance - Primary   Likely multifactorial, including OA of knee and ankle, diabetic neuropathy Had similar complaints in the last visit, but had stopped taking his medicines Checked CBC, CMP, TSH, B12 from chart Due to persistent symptoms even after starting medicines, referred to Neurology      Relevant Orders   Ambulatory referral to Neurology   Chest pain   Reports episodes of chest pain, lasting for few minutes EKG: Sinus rhythm. No signs of active ischemia.  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    Dispense:  30 tablet    Refill:  3     Shavelle Runkel MARLA Blanch, MD

## 2024-06-27 NOTE — Assessment & Plan Note (Addendum)
 BP Readings from Last 1 Encounters:  06/27/24 (!) 84/64   Was Uncontrolled due to noncompliance in the last visit Had low BP today, DC olmesartan -HCTZ 40-25 mg QD for now, concern for orthostatic hypotension Continue amlodipine  10 mg QD Counseled for compliance with the medications Advised DASH diet and moderate exercise/walking as tolerated

## 2024-06-27 NOTE — Assessment & Plan Note (Signed)
 Has gait disturbance and chronic weakness of b/l LE Could be peripheral neuropathy Referred to Neurology for further evaluation

## 2024-06-27 NOTE — Assessment & Plan Note (Signed)
 Lab Results  Component Value Date   HGBA1C 8.6 (H) 06/12/2024   Uncontrolled due to noncompliance Does not check blood glucose, but has glucometer at home Restarted glimepiride  1 mg QD and Farxiga  10 mg QD recently Advised to follow diabetic diet On statin and ARB, needs to be compliant F/u CMP and lipid panel Diabetic eye exam: Advised to follow up with Ophthalmology for diabetic eye exam

## 2024-06-27 NOTE — Assessment & Plan Note (Signed)
 Reports episodes of chest pain, lasting for few minutes EKG: Sinus rhythm. No signs of active ischemia.  Could be related to epigastric pain from GERD, advised to avoid taking oral NSAIDs Pepcid  PRN for gastritis

## 2024-06-27 NOTE — Assessment & Plan Note (Signed)
 Chronic tinnitus, could be related to presbycusis Refer to ENT specialist

## 2024-06-29 DIAGNOSIS — M79671 Pain in right foot: Secondary | ICD-10-CM | POA: Insufficient documentation

## 2024-06-29 NOTE — Addendum Note (Signed)
 Addended byBETHA TOBIE DOWNS on: 06/29/2024 06:55 PM   Modules accepted: Orders

## 2024-06-29 NOTE — Assessment & Plan Note (Signed)
 Since 03/27/24 Has a tender mass in right mid foot Referred to Podiatry

## 2024-07-03 ENCOUNTER — Ambulatory Visit: Admitting: Neurology

## 2024-07-03 ENCOUNTER — Encounter: Payer: Self-pay | Admitting: Neurology

## 2024-07-03 VITALS — BP 162/88 | HR 87 | Ht 72.0 in | Wt 172.0 lb

## 2024-07-03 DIAGNOSIS — R2689 Other abnormalities of gait and mobility: Secondary | ICD-10-CM | POA: Diagnosis not present

## 2024-07-03 DIAGNOSIS — R29898 Other symptoms and signs involving the musculoskeletal system: Secondary | ICD-10-CM

## 2024-07-03 DIAGNOSIS — M5416 Radiculopathy, lumbar region: Secondary | ICD-10-CM | POA: Diagnosis not present

## 2024-07-03 DIAGNOSIS — R270 Ataxia, unspecified: Secondary | ICD-10-CM

## 2024-07-03 DIAGNOSIS — R3915 Urgency of urination: Secondary | ICD-10-CM | POA: Diagnosis not present

## 2024-07-03 DIAGNOSIS — R269 Unspecified abnormalities of gait and mobility: Secondary | ICD-10-CM

## 2024-07-03 NOTE — Progress Notes (Signed)
 [facility]  PATIENT: Craig Wagner DOB: Dec 02, 1955  REFERRING DOCTOR OR PCP:  Suzzane Blanch, MD SOURCE: patient, notes from PCP.  Imaging and lab results, MRI and CT personally reviewed  _________________________________   HISTORICAL  CHIEF COMPLAINT:  Chief Complaint  Patient presents with   New Patient (Initial Visit)    Pt in room 10.alone. Internal referral for gait concerns. Pt said he has a hard time walking. Pt said he fell last week. No ER visit. Pt said he fell about 1 year ago and hurt his knee (surgery was not needed)    HISTORY OF PRESENT ILLNESS:  I had the pleasure of seeing your patient, Craig Wagner, at [facility] for neurologic consultation regarding his gait and ataxia issues.  He is a 68 yo man with HTN, NIDDM, HLD and BPH who reports poor gait and balance since mid 2023 when he fell.  However, in retrospect, he had a fall in 2024 that he feels was due to the knee giving out and poor balance.   After that fall he noted right arm weakness and reduced coordination.   He started to use a walker around March 2025 due to the leg feeling weaker.  He was using a right knee brace due to weakness after his fall.   He actually feels the right leg is stronger but balance is no better over the last month..   He denies numbness.   He notes urinary urgency and just one episode of urge incontinence.     He denies LBP or neck pain.      Currently, he is able to walk 1/4 mile with his cane and much further with a walker.   He leans left as he walks - sometimes hitting left door jamb.   His right arm remains clumsy which affects his handwriting.  This has not progressed compared to earlier this year.  He notes right leg weakness that is actually doing better now than it did several months ago.     Vascular risk factors:   HTN, NIDDM, HLD.  No smoking history.   FH of CAD/MI  Imaging: CT scan of the head 09/24/2022 personally reviewed  showed more atrophy than typical for age and hypodense changes consistent with significant chronic microvascular ischemic change.  There were no acute findings.  Minimal calcification noted in the vertebral arteries at the skull base and the internal carotid arteries and the siphons  Lumbar spine 10/31/2015 showed Multilevel disc disease and facet disease.   At L1-L2, there is a right foraminal disc protrusion causing lateral recess greater than foraminal narrowing with possibility of right L2 nerve root compression.   At L3-L4, there is mild spinal stenosis, right greater than left foraminal narrowing lateral recess stenosis.  There is potential to affect the right L3 and bilateral L4 nerve roots.  At L4-L5, there is disc herniation causing mild spinal stenosis foraminal narrowing right lateral recess stenosis.  There is potential to affect both L5 nerve roots.  At L5-S1, disc protrusion to the left that could affect the left S1 nerve root and to lesser extent L5     REVIEW OF SYSTEMS: Constitutional: No fevers, chills, sweats, or change in appetite Eyes: No visual changes, double vision, eye pain Ear, nose and throat: No hearing loss, ear pain, nasal congestion, sore throat.  Reports right tinnitus Cardiovascular: No chest pain, palpitations Respiratory:  No shortness of breath at rest or with exertion.   No wheezes GastrointestinaI: No  nausea, vomiting, diarrhea, abdominal pain, fecal incontinence Genitourinary:  No dysuria, urinary retention or frequency.  No nocturia. Musculoskeletal:  No neck pain, back pain Integumentary: No rash, pruritus, skin lesions Neurological: as above Psychiatric: H/o major depression but denies depression at this time.  No anxiety at this time.  Had suicidal ideation in December 2023 Endocrine: No palpitations, diaphoresis, change in appetite, change in weigh or increased thirst Hematologic/Lymphatic:  No anemia, purpura, petechiae. Allergic/Immunologic: No  itchy/runny eyes, nasal congestion, recent allergic reactions, rashes  ALLERGIES: No Known Allergies  HOME MEDICATIONS:  Current Outpatient Medications:    amLODipine  (NORVASC ) 10 MG tablet, Take 1 tablet (10 mg total) by mouth daily., Disp: 30 tablet, Rfl: 2   Cholecalciferol (VITAMIN D3) 25 MCG (1000 UT) CAPS, Take 1 capsule (1,000 Units total) by mouth daily., Disp: 90 capsule, Rfl: 2   dapagliflozin  propanediol (FARXIGA ) 10 MG TABS tablet, Take 1 tablet (10 mg total) by mouth daily., Disp: 90 tablet, Rfl: 1   famotidine  (PEPCID ) 20 MG tablet, Take 1 tablet (20 mg total) by mouth daily., Disp: 30 tablet, Rfl: 3   glimepiride  (AMARYL ) 1 MG tablet, Take 1 tablet (1 mg total) by mouth daily with breakfast., Disp: 30 tablet, Rfl: 2   ibuprofen  (ADVIL ) 200 MG tablet, Take 200-400 mg by mouth every 6 (six) hours as needed for mild pain or headache., Disp: , Rfl:    traZODone  (DESYREL ) 50 MG tablet, Take 0.5-1 tablets (25-50 mg total) by mouth at bedtime as needed for sleep., Disp: 30 tablet, Rfl: 3   atorvastatin  (LIPITOR) 80 MG tablet, Take 1 tablet (80 mg total) by mouth daily. (Patient not taking: Reported on 07/03/2024), Disp: 90 tablet, Rfl: 3   finasteride  (PROPECIA ) 1 MG tablet, Take 1 tablet (1 mg total) by mouth daily. (Patient not taking: Reported on 07/03/2024), Disp: 90 tablet, Rfl: 1   sildenafil  (REVATIO ) 20 MG tablet, Take 2-5 tablets (40-100 mg total) by mouth 30 minutes before needed. (Patient not taking: Reported on 07/03/2024), Disp: 30 tablet, Rfl: 0  PAST MEDICAL HISTORY: Past Medical History:  Diagnosis Date   Degenerative joint disease    Diabetes mellitus without complication (HCC)    Diverticulitis    Gout    Hypertension    Involuntary commitment 09/25/2022   Irritable bowel syndrome    Sleep apnea     PAST SURGICAL HISTORY: Past Surgical History:  Procedure Laterality Date   BIOPSY  10/06/2020   Procedure: BIOPSY;  Surgeon: Cindie Carlin POUR, DO;  Location: [facility]  ENDO SUITE;  Service: Endoscopy;;   COLONOSCOPY WITH PROPOFOL  N/A 10/06/2020   Colonoscopy with non-bleeding internal hemorrhoids. Many diverticula in entire colon. Localized mild inflammation in sigmoid colon. Colon mucosa with hyperemia, negative inflammation.    compound fx tibia and fibula     WRIST SURGERY Left     FAMILY HISTORY: Family History  Problem Relation Age of Onset   Cancer Mother    Heart disease Father    Colon cancer Neg Hx    Colon polyps Neg Hx     SOCIAL HISTORY: Social History   Socioeconomic History   Marital status: Married    Spouse name: Not on file   Number of children: Not on file   Years of education: Not on file   Highest education level: Not on file  Occupational History   Not on file  Tobacco Use   Smoking status: Never   Smokeless tobacco: Never  Vaping Use   Vaping status: Never  Used  Substance and Sexual Activity   Alcohol use: Yes    Alcohol/week: 1.0 standard drink of alcohol    Types: 1 Cans of beer per week   Drug use: No   Sexual activity: Not Currently    Partners: Female  Other Topics Concern   Not on file  Social History Narrative   Grew up in [place]    Moved to [place]  for school.   Father was a Stage manager in the [employer].   Works part-time.   Married for 28 years.    Does not have children.   Has a lot of pets.   Takes care of mules, dogs, pets.   Has a motorcycle.       Social Drivers of Corporate investment banker Strain: Low Risk  (03/28/2018)   Overall Financial Resource Strain (CARDIA)    Difficulty of Paying Living Expenses: Not hard at all  Food Insecurity: No Food Insecurity (09/28/2022)   Hunger Vital Sign    Worried About Running Out of Food in the Last Year: Never true    Ran Out of Food in the Last Year: Never true  Transportation Needs: No Transportation Needs (09/28/2022)   PRAPARE - Administrator, Civil Service (Medical): No    Lack of Transportation (Non-Medical): No  Physical Activity:  Inactive (03/28/2018)   Exercise Vital Sign    Days of Exercise per Week: 0 days    Minutes of Exercise per Session: 0 min  Stress: No Stress Concern Present (03/28/2018)   [employer] of Occupational Health - Occupational Stress Questionnaire    Feeling of Stress : Not at all  Social Connections: Moderately Integrated (03/28/2018)   Social Connection and Isolation Panel    Frequency of Communication with Friends and Family: Once a week    Frequency of Social Gatherings with Friends and Family: Never    Attends Religious Services: More than 4 times per year    Active Member of [employer] or Organizations: Yes    Attends Banker Meetings: Never    Marital Status: Married  Catering manager Violence: Not At Risk (09/28/2022)   Humiliation, Afraid, Rape, and Kick questionnaire    Fear of Current or Ex-Partner: No    Emotionally Abused: No    Physically Abused: No    Sexually Abused: No       PHYSICAL EXAM  Vitals:   07/03/24 0948  BP: (!) 162/88  Pulse: 87  Weight: 172 lb (78 kg)  Height: 6' (1.829 m)    Body mass index is 23.33 kg/m.   General: The patient is well-developed and well-nourished and in no acute distress  HEENT:  Head is [place]/AT.  Sclera are anicteric.  Funduscopic exam shows normal optic discs and retinal vessels.  Neck: No carotid bruits are noted.  The neck is nontender.  Cardiovascular: The heart has a regular rate and rhythm with a normal S1 and S2. There were no murmurs, gallops or rubs.    Skin: Extremities are without rash or  edema.  Musculoskeletal:  Back is nontender  Neurologic Exam  Mental status: The patient is alert and oriented x 3 at the time of the examination. The patient has apparent normal recent and remote memory, with an apparently normal attention span and concentration ability.   Speech is normal.  Cranial nerves: Extraocular movements are full. Pupils are equal, round, and reactive to light and accomodation.  Visual  fields are full.  Facial symmetry is present.  There is good facial sensation to soft touch bilaterally.Facial strength is normal.  Trapezius and sternocleidomastoid strength is normal. No dysarthria is noted.  The tongue is midline, and the patient has symmetric elevation of the soft palate. No obvious hearing deficits are noted.  Motor:  Muscle bulk is normal.   Tone is normal. Strength is  5 / 5 in all 4 extremities.   Sensory: Sensory testing is intact to pinprick, soft touch and vibration sensation in the arms.  He has 60% vibration sensation at the toes and 90% vibration sensation at the ankles, fairly symmetric.  Coordination: Cerebellar testing reveals reduced right finger-nose-finger and reduced right heel-to-shin .  Gait and station: He needs to use both arms to rise from the wheelchair.  Once up, he can stand without support and take shuffling steps within the room.  He cannot tandem walk.  Romberg is positive.  Reflexes: Deep tendon reflexes are symmetric and normal in the arms, 3 at the left knee and 2 at the right knee and 2 at the ankles.  There is no clonus..   Plantar responses are flexor.    DIAGNOSTIC DATA (LABS, IMAGING, TESTING) - I reviewed patient records, labs, notes, testing and imaging myself where available.  Lab Results  Component Value Date   WBC 6.0 06/12/2024   HGB 16.1 06/12/2024   HCT 46.3 06/12/2024   MCV 91 06/12/2024   PLT 158 06/12/2024      Component Value Date/Time   NA 141 06/12/2024 1039   K 4.0 06/12/2024 1039   CL 100 06/12/2024 1039   CO2 21 06/12/2024 1039   GLUCOSE 195 (H) 06/12/2024 1039   GLUCOSE 251 (H) 09/24/2022 1747   BUN 21 06/12/2024 1039   CREATININE 1.60 (H) 06/12/2024 1039   CREATININE 1.14 07/06/2018 1339   CALCIUM  10.0 06/12/2024 1039   PROT 6.8 06/12/2024 1039   ALBUMIN 4.5 06/12/2024 1039   AST 21 06/12/2024 1039   ALT 19 06/12/2024 1039   ALKPHOS 89 06/12/2024 1039   BILITOT 0.8 06/12/2024 1039   GFRNONAA 50 (L)  09/24/2022 1747   GFRNONAA 70 07/06/2018 1339   GFRAA 81 07/06/2018 1339   Lab Results  Component Value Date   CHOL 254 (H) 06/12/2024   HDL 47 06/12/2024   LDLCALC 181 (H) 06/12/2024   TRIG 141 06/12/2024   CHOLHDL 5.4 (H) 06/12/2024   Lab Results  Component Value Date   HGBA1C 8.6 (H) 06/12/2024   Lab Results  Component Value Date   VITAMINB12 684 06/12/2024   Lab Results  Component Value Date   TSH 3.180 03/29/2023       ASSESSMENT AND PLAN  Gait disturbance - Plan: MR BRAIN WO CONTRAST, MR CERVICAL SPINE WO CONTRAST, MR LUMBAR SPINE WO CONTRAST  Shuffling gait - Plan: MR BRAIN WO CONTRAST  Urinary urgency - Plan: MR BRAIN WO CONTRAST  Ataxia of right upper extremity - Plan: MR BRAIN WO CONTRAST, MR CERVICAL SPINE WO CONTRAST  Right leg weakness - Plan: MR LUMBAR SPINE WO CONTRAST  Lumbar radiculopathy - Plan: MR LUMBAR SPINE WO CONTRAST   In summary, Mr. Clayson is a 68 year old man who reports worsening of his gait this year though may have had some difficulties with balance when he fell in late 2023.  Besides noting clumsiness in the right hand and weakness in the right leg.  He has a shuffling gait and urinary urgency.  We need to rule out several different processes including: Cerebellar stroke, normal  pressure hydrocephalus, cervical myelopathy and significant lumbar radiculopathy/spinal stenosis.  Therefore we will check MRI of the brain, cervical spine and lumbar spine to determine if these conditions exist.  If he does have a cervical myelopathy, I do recommend referral to neurosurgery.  If he has a cerebellar stroke we will check a carotid and vertebral Doppler study or CTA/MRA.  Because his leg is starting to feel stronger again, if he has lumbar spinal stenosis or lumbar radiculopathy we may just observe or consider referral.  Evidence of normal pressure hydrocephalus, consider a high-volume lumbar puncture to further evaluate.   Follow-up will be arranged  based on the results of the MRI studies.  He is advised to give us  a call if he notes significant worsening or new neurologic symptoms.  Thank you for asking to see this patient.  Please let me know if I can be of further assistance with him or other patients in the future.   Tavis Kring A. Vear, MD, [facility] 07/03/2024, 10:05 AM Certified in Neurology, Clinical Neurophysiology, Sleep Medicine and Neuroimaging  [facility] [address] [place], [place] [address] [phone]

## 2024-07-04 ENCOUNTER — Other Ambulatory Visit: Payer: Self-pay | Admitting: Internal Medicine

## 2024-07-04 DIAGNOSIS — F5101 Primary insomnia: Secondary | ICD-10-CM

## 2024-07-05 DIAGNOSIS — D2262 Melanocytic nevi of left upper limb, including shoulder: Secondary | ICD-10-CM | POA: Diagnosis not present

## 2024-07-05 DIAGNOSIS — L111 Transient acantholytic dermatosis [Grover]: Secondary | ICD-10-CM | POA: Diagnosis not present

## 2024-07-05 DIAGNOSIS — C44619 Basal cell carcinoma of skin of left upper limb, including shoulder: Secondary | ICD-10-CM | POA: Diagnosis not present

## 2024-07-05 DIAGNOSIS — Z85828 Personal history of other malignant neoplasm of skin: Secondary | ICD-10-CM | POA: Diagnosis not present

## 2024-07-05 DIAGNOSIS — D1801 Hemangioma of skin and subcutaneous tissue: Secondary | ICD-10-CM | POA: Diagnosis not present

## 2024-07-05 DIAGNOSIS — L814 Other melanin hyperpigmentation: Secondary | ICD-10-CM | POA: Diagnosis not present

## 2024-07-05 DIAGNOSIS — L821 Other seborrheic keratosis: Secondary | ICD-10-CM | POA: Diagnosis not present

## 2024-07-05 DIAGNOSIS — C44319 Basal cell carcinoma of skin of other parts of face: Secondary | ICD-10-CM | POA: Diagnosis not present

## 2024-07-05 DIAGNOSIS — D2261 Melanocytic nevi of right upper limb, including shoulder: Secondary | ICD-10-CM | POA: Diagnosis not present

## 2024-07-12 ENCOUNTER — Ambulatory Visit
Admission: RE | Admit: 2024-07-12 | Discharge: 2024-07-12 | Disposition: A | Discharge status: Home / Self Care | Source: Ambulatory Visit | Attending: Neurology | Admitting: Neurology

## 2024-07-12 DIAGNOSIS — R269 Unspecified abnormalities of gait and mobility: Secondary | ICD-10-CM | POA: Diagnosis not present

## 2024-07-12 DIAGNOSIS — M5416 Radiculopathy, lumbar region: Secondary | ICD-10-CM

## 2024-07-12 DIAGNOSIS — R270 Ataxia, unspecified: Secondary | ICD-10-CM

## 2024-07-12 DIAGNOSIS — R3915 Urgency of urination: Secondary | ICD-10-CM

## 2024-07-12 DIAGNOSIS — R2689 Other abnormalities of gait and mobility: Secondary | ICD-10-CM

## 2024-07-12 DIAGNOSIS — R29898 Other symptoms and signs involving the musculoskeletal system: Secondary | ICD-10-CM

## 2024-07-13 ENCOUNTER — Ambulatory Visit: Admitting: Nurse Practitioner

## 2024-07-16 ENCOUNTER — Ambulatory Visit: Payer: Self-pay | Admitting: Neurology

## 2024-07-18 NOTE — Telephone Encounter (Signed)
 Called pt at [phone]. LVM asking for a call back or relayed he can check mychart for results and reply with any questions/concerns.

## 2024-07-26 ENCOUNTER — Encounter: Payer: Self-pay | Admitting: Podiatry

## 2024-07-26 ENCOUNTER — Ambulatory Visit

## 2024-07-26 ENCOUNTER — Ambulatory Visit: Admitting: Podiatry

## 2024-07-26 DIAGNOSIS — M7732 Calcaneal spur, left foot: Secondary | ICD-10-CM | POA: Diagnosis not present

## 2024-07-26 DIAGNOSIS — M722 Plantar fascial fibromatosis: Secondary | ICD-10-CM

## 2024-07-26 DIAGNOSIS — M67479 Ganglion, unspecified ankle and foot: Secondary | ICD-10-CM

## 2024-07-26 DIAGNOSIS — M67471 Ganglion, right ankle and foot: Secondary | ICD-10-CM | POA: Diagnosis not present

## 2024-07-27 NOTE — Progress Notes (Signed)
 Subjective:   Patient ID: Craig Wagner, male   DOB: 68 y.o.   MRN: [id number]   HPI Patient presents stating he has a mass on the bottom of his left arch that he has noticed over the last approximate 6 months to 1 year.  States that it is not sore he was just concerned about it as he.  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       Objective:  Physical Exam Vitals and nursing note reviewed.  Constitutional:      Appearance: He is well-developed.  Pulmonary:     Effort: Pulmonary effort is normal.  Musculoskeletal:        General: Normal range of motion.  Skin:    General: Skin is warm.  Neurological:     Mental Status: He is alert.     Vascular was noted to be mildly diminished PT DP pulses but intact with neurological showing diminishment of sharp dull vibratory diminished muscle strength and also range of motion.  Patient is noted on the plantar aspect of the left mid arch a mass measuring approximately 1.5 x 1 cm that appears to be directly within the plantar fascia and nontender and no discoloration associated with it.  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  I do not recommend excision of this as it is nontender and certainly strongly appears to be a benign type mass.  Patient will be seen back if it grows in size changes color or becomes painful for excision and patient is comfortable with this  X-rays indicate plantar and posterior spur formation but no indication of calcification of the mid arch

## 2024-07-30 ENCOUNTER — Encounter: Payer: Self-pay | Admitting: Neurology

## 2024-07-30 ENCOUNTER — Ambulatory Visit: Admitting: Neurology

## 2024-07-30 VITALS — BP 126/79 | HR 78 | Resp 16 | Ht 72.0 in

## 2024-07-30 DIAGNOSIS — M25561 Pain in right knee: Secondary | ICD-10-CM

## 2024-07-30 DIAGNOSIS — R29898 Other symptoms and signs involving the musculoskeletal system: Secondary | ICD-10-CM | POA: Diagnosis not present

## 2024-07-30 DIAGNOSIS — R3915 Urgency of urination: Secondary | ICD-10-CM

## 2024-07-30 DIAGNOSIS — G319 Degenerative disease of nervous system, unspecified: Secondary | ICD-10-CM | POA: Diagnosis not present

## 2024-07-30 DIAGNOSIS — R269 Unspecified abnormalities of gait and mobility: Secondary | ICD-10-CM | POA: Diagnosis not present

## 2024-07-30 DIAGNOSIS — G8929 Other chronic pain: Secondary | ICD-10-CM

## 2024-07-30 DIAGNOSIS — R270 Ataxia, unspecified: Secondary | ICD-10-CM

## 2024-07-30 MED ORDER — TRAMADOL HCL 50 MG PO TABS: ORAL_TABLET | ORAL | 2 refills | Status: AC

## 2024-07-30 NOTE — Progress Notes (Signed)
 [facility]  PATIENT: Craig Wagner DOB: 1956-09-08  REFERRING DOCTOR OR PCP:  Suzzane Blanch, MD SOURCE: patient, notes from PCP.  Imaging and lab results, MRI and CT personally reviewed  _________________________________   HISTORICAL  CHIEF COMPLAINT:  Chief Complaint  Patient presents with   Results    Rm11, alone, Pt is here to GO OVER RESULTS. Pt frustrated and says why did he have to come in person when its the 21st centurey and we have electronic now     HISTORY OF PRESENT ILLNESS:  Craig Wagner, at [facility] is a 68 year old man with gait and ataxia issues.  Update 07/30/2024. He  reports poor gait and balance since mid 2023 when he fell.  He feels that the progression has been steady with no sudden change.   He started to use a walker around March 2025 due to the leg feeling weaker and the gait being clumsier.  He was using a right knee brace due to weakness after his fall.  He also has right knee pain.  He denies numbness.   He notes urinary urgency and just one episode of urge incontinence.     Since last visit, he has had MRI of the brain, cervical spine and lumbar spine on 07/12/2024.  The MRI of the brain shows middle cerebellar peduncle atrophy, severe on the right and mild to moderate on the left.  There is mild cerebellar atrophy.  There also appears to be some pontine atrophy.  Of note, in 2023 on a CT scan, some atrophy is noted but this is more symmetric   the MRI of the cervical spine showed a normal spinal cord.  There is mild spinal stenosis at C5-C6 and C6-C7.  There is significant foraminal narrowing to the right at C4-C5 and right greater than left at C5-C6 and milder foraminal narrowing at C6-C7.  MRI of the lumbar spine showed multilevel degenerative changes with mild spinal stenosis at L3-L4 and L4-L5.  Multilevel foraminal and lateral recess stenosis.  He denies LBP or neck pain.    His right knee has been hurting  him more over the last couple months  Currently, he is able to walk at least 1/4 mile with his walker.   He leans left as he walks - sometimes hitting left door jamb.   His right arm remains clumsy which affects his handwriting.  This has not progressed compared to earlier this year.  He notes right leg weakness that is actually doing better now than it did several months ago.    He has had OSA x 10+ years.  He never used the CPAP due to tolerability  Vascular risk factors:   HTN, NIDDM, HLD.  No smoking history.   FH of CAD/MI  Imaging: MRI of the brain 07/12/2024 showed medullary and teen atrophy.  There is severe atrophy of the right middle cerebellar peduncle, minimal atrophy of the left middle cerebellar peduncle, moderate atrophy in the medulla and pons and to a lesser extent midbrain.  Mild atrophy in the cerebral hemispheres.  Chronic microvascular ischemic changes noted.  No acute findings.  MRI of the cervical spine 07/12/2024 shows mild spinal stenosis at C5-C6 and C6-C7.  There is foraminal narrowing to the right at C4-C5, right greater than left at C5-C6 which could affect the right C5 and both C6 nerve roots and milder foraminal narrowing at C6-C7.  The spinal cord is normal signal.  MRI of the lumbar spine in 2025 showed  multilevel degenerative changes with mild spinal stenosis at L3-L4 and L4-L5.  At both of these levels there is foraminal recess stenosis.  CT scan of the head 09/24/2022 personally reviewed showed more atrophy than typical for age and hypodense changes consistent with significant chronic microvascular ischemic change.  There were no acute findings.  Minimal calcification noted in the vertebral arteries at the skull base and the internal carotid arteries and the siphons  Lumbar spine 10/31/2015 showed Multilevel disc disease and facet disease.   At L1-L2, there is a right foraminal disc protrusion causing lateral recess greater than foraminal narrowing with possibility of  right L2 nerve root compression.   At L3-L4, there is mild spinal stenosis, right greater than left foraminal narrowing lateral recess stenosis.  There is potential to affect the right L3 and bilateral L4 nerve roots.  At L4-L5, there is disc herniation causing mild spinal stenosis foraminal narrowing right lateral recess stenosis.  There is potential to affect both L5 nerve roots.  At L5-S1, disc protrusion to the left that could affect the left S1 nerve root and to lesser extent L5     REVIEW OF SYSTEMS: Constitutional: No fevers, chills, sweats, or change in appetite Eyes: No visual changes, double vision, eye pain Ear, nose and throat: No hearing loss, ear pain, nasal congestion, sore throat.  Reports right tinnitus Cardiovascular: No chest pain, palpitations Respiratory:  No shortness of breath at rest or with exertion.   No wheezes GastrointestinaI: No nausea, vomiting, diarrhea, abdominal pain, fecal incontinence Genitourinary:  No dysuria, urinary retention or frequency.  No nocturia. Musculoskeletal:  No neck pain, back pain Integumentary: No rash, pruritus, skin lesions Neurological: as above Psychiatric: H/o major depression but denies depression at this time.  No anxiety at this time.  Had suicidal ideation in December 2023 Endocrine: No palpitations, diaphoresis, change in appetite, change in weigh or increased thirst Hematologic/Lymphatic:  No anemia, purpura, petechiae. Allergic/Immunologic: No itchy/runny eyes, nasal congestion, recent allergic reactions, rashes  ALLERGIES: No Known Allergies  HOME MEDICATIONS:  Current Outpatient Medications:    amLODipine  (NORVASC ) 10 MG tablet, Take 1 tablet (10 mg total) by mouth daily., Disp: 30 tablet, Rfl: 2   Cholecalciferol (VITAMIN D3) 25 MCG (1000 UT) CAPS, Take 1 capsule (1,000 Units total) by mouth daily., Disp: 90 capsule, Rfl: 2   dapagliflozin  propanediol (FARXIGA ) 10 MG TABS tablet, Take 1 tablet (10 mg total) by mouth  daily., Disp: 90 tablet, Rfl: 1   famotidine  (PEPCID ) 20 MG tablet, Take 1 tablet (20 mg total) by mouth daily., Disp: 30 tablet, Rfl: 3   glimepiride  (AMARYL ) 1 MG tablet, Take 1 tablet (1 mg total) by mouth daily with breakfast., Disp: 30 tablet, Rfl: 2   ibuprofen  (ADVIL ) 200 MG tablet, Take 200-400 mg by mouth every 6 (six) hours as needed for mild pain or headache., Disp: , Rfl:    traMADol (ULTRAM) 50 MG tablet, One po daily prn moderate knee pain, Disp: 30 tablet, Rfl: 2   traZODone  (DESYREL ) 50 MG tablet, TAKE 0.5-1 TABLETS BY MOUTH AT BEDTIME AS NEEDED FOR SLEEP., Disp: 90 tablet, Rfl: 2  PAST MEDICAL HISTORY: Past Medical History:  Diagnosis Date   Degenerative joint disease    Diabetes mellitus without complication (HCC)    Diverticulitis    Gout    Hypertension    Involuntary commitment 09/25/2022   Irritable bowel syndrome    Sleep apnea     PAST SURGICAL HISTORY: Past Surgical History:  Procedure Laterality  Date   BIOPSY  10/06/2020   Procedure: BIOPSY;  Surgeon: Cindie Carlin POUR, DO;  [facility];  Service: Endoscopy;;   COLONOSCOPY WITH PROPOFOL  N/A 10/06/2020   Colonoscopy with non-bleeding internal hemorrhoids. Many diverticula in entire colon. Localized mild inflammation in sigmoid colon. Colon mucosa with hyperemia, negative inflammation.    compound fx tibia and fibula     WRIST SURGERY Left     FAMILY HISTORY: Family History  Problem Relation Age of Onset   Cancer Mother    Heart disease Father    Colon cancer Neg Hx    Colon polyps Neg Hx     SOCIAL HISTORY: Social History   Socioeconomic History   Marital status: Married    Spouse name: Not on file   Number of children: Not on file   Years of education: Not on file   Highest education level: Not on file  Occupational History   Not on file  Tobacco Use   Smoking status: Never   Smokeless tobacco: Never  Vaping Use   Vaping status: Never Used  Substance and Sexual Activity    Alcohol use: Yes    Alcohol/week: 1.0 standard drink of alcohol    Types: 1 Cans of beer per week   Drug use: No   Sexual activity: Not Currently    Partners: Female  Other Topics Concern   Not on file  Social History Narrative   Grew up in [place]    Moved to [place]  for school.   Father was a Stage manager in the [employer].   Works part-time.   Married for 28 years.    Does not have children.   Has a lot of pets.   Takes care of mules, dogs, pets.   Has a motorcycle.       Social Drivers of Corporate investment banker Strain: Low Risk  (03/28/2018)   Overall Financial Resource Strain (CARDIA)    Difficulty of Paying Living Expenses: Not hard at all  Food Insecurity: No Food Insecurity (09/28/2022)   Hunger Vital Sign    Worried About Running Out of Food in the Last Year: Never true    Ran Out of Food in the Last Year: Never true  Transportation Needs: No Transportation Needs (09/28/2022)   PRAPARE - Administrator, Civil Service (Medical): No    Lack of Transportation (Non-Medical): No  Physical Activity: Inactive (03/28/2018)   Exercise Vital Sign    Days of Exercise per Week: 0 days    Minutes of Exercise per Session: 0 min  Stress: No Stress Concern Present (03/28/2018)   [employer] of Occupational Health - Occupational Stress Questionnaire    Feeling of Stress : Not at all  Social Connections: Moderately Integrated (03/28/2018)   Social Connection and Isolation Panel    Frequency of Communication with Friends and Family: Once a week    Frequency of Social Gatherings with Friends and Family: Never    Attends Religious Services: More than 4 times per year    Active Member of [employer] or Organizations: Yes    Attends Banker Meetings: Never    Marital Status: Married  Catering manager Violence: Not At Risk (09/28/2022)   Humiliation, Afraid, Rape, and Kick questionnaire    Fear of Current or Ex-Partner: No    Emotionally Abused: No    Physically Abused: No     Sexually Abused: No       PHYSICAL EXAM  Vitals:  07/30/24 1414  BP: 126/79  Pulse: 78  Resp: 16  SpO2: 97%  Height: 6' (1.829 m)    Body mass index is 23.33 kg/m.   General: The patient is well-developed and well-nourished and in no acute distress  HEENT:  Head is [place]/AT.  Sclera are anicteric.  Funduscopic exam shows normal optic discs and retinal vessels.  Neck: No carotid bruits are noted.  The neck is nontender.  Cardiovascular: The heart has a regular rate and rhythm with a normal S1 and S2. There were no murmurs, gallops or rubs.    Skin: Extremities are without rash or  edema.  Musculoskeletal:  Back is nontender  Neurologic Exam  Mental status: The patient is alert and oriented x 3 at the time of the examination. The patient has apparent normal recent and remote memory, with an apparently normal attention span and concentration ability.   Speech is normal.  Cranial nerves: Extraocular movements are full. Pupils are equal, round, and reactive to light and accomodation.  Visual fields are full.  Facial symmetry is present. There is good facial sensation to soft touch bilaterally.Facial strength is normal.  Trapezius and sternocleidomastoid strength is normal. No dysarthria is noted.  The tongue is midline, and the patient has symmetric elevation of the soft palate. No obvious hearing deficits are noted.  Motor:  Muscle bulk is normal.   Tone is normal. Strength is  5 / 5 in all 4 extremities.   Sensory: Sensory testing is intact to pinprick, soft touch and vibration sensation in the arms.  He has 60% vibration sensation at the toes and 90% vibration sensation at the ankles, fairly symmetric.  Coordination: Cerebellar testing reveals reduced right finger-nose-finger and reduced right heel-to-shin .  Gait and station: He needs to use both arms to rise from the wheelchair.  Once up, he can stand without support and take shuffling steps within the room.  He cannot  tandem walk.  Romberg is positive.  Reflexes: Deep tendon reflexes are symmetric and normal in the arms, 3 at the left knee and 2 at the right knee and 2 at the ankles.  There is no clonus..   Plantar responses are flexor.    DIAGNOSTIC DATA (LABS, IMAGING, TESTING) - I reviewed patient records, labs, notes, testing and imaging myself where available.  Lab Results  Component Value Date   WBC 6.0 06/12/2024   HGB 16.1 06/12/2024   HCT 46.3 06/12/2024   MCV 91 06/12/2024   PLT 158 06/12/2024      Component Value Date/Time   NA 141 06/12/2024 1039   K 4.0 06/12/2024 1039   CL 100 06/12/2024 1039   CO2 21 06/12/2024 1039   GLUCOSE 195 (H) 06/12/2024 1039   GLUCOSE 251 (H) 09/24/2022 1747   BUN 21 06/12/2024 1039   CREATININE 1.60 (H) 06/12/2024 1039   CREATININE 1.14 07/06/2018 1339   CALCIUM  10.0 06/12/2024 1039   PROT 6.8 06/12/2024 1039   ALBUMIN 4.5 06/12/2024 1039   AST 21 06/12/2024 1039   ALT 19 06/12/2024 1039   ALKPHOS 89 06/12/2024 1039   BILITOT 0.8 06/12/2024 1039   GFRNONAA 50 (L) 09/24/2022 1747   GFRNONAA 70 07/06/2018 1339   GFRAA 81 07/06/2018 1339   Lab Results  Component Value Date   CHOL 254 (H) 06/12/2024   HDL 47 06/12/2024   LDLCALC 181 (H) 06/12/2024   TRIG 141 06/12/2024   CHOLHDL 5.4 (H) 06/12/2024   Lab Results  Component Value Date  HGBA1C 8.6 (H) 06/12/2024   Lab Results  Component Value Date   VITAMINB12 684 06/12/2024   Lab Results  Component Value Date   TSH 3.180 03/29/2023       ASSESSMENT AND PLAN  Gait disturbance - Plan: Ambulatory referral to Physical Therapy  Urinary urgency  Ataxia of right upper extremity - Plan: Ambulatory referral to Physical Therapy  Right leg weakness  Cerebellar atrophy - Plan: Ambulatory referral to Physical Therapy   He has unusual imaging with brainstem and middle cerebellar peduncle atrophy.  Increased signal in the middle cerebellar peduncle could represent chronic stroke.  The  atrophy pattern might also be related to spinal cerebellar atrophy or OPCA.  He is not interested in genetic testing. Advised to continue to use the walker for longer distances and cane or walker for shorter distances.  I will set up physical therapy for the ataxia His knee pain is also limiting ambulation some.  I will send in a prescription for small dose of tramadol to take when pain is more severe.   Return in 1 year or sooner if there are new or worsening neurologic symptoms.    Shaira Sova A. Vear, MD, [facility] 07/30/2024, 5:38 PM Certified in Neurology, Clinical Neurophysiology, Sleep Medicine and Neuroimaging  [facility] [address] [place], [place] [address] [phone]

## 2024-08-02 NOTE — Progress Notes (Signed)
 Craig Wagner                                          MRN: [id number]   08/02/2024   The [facility] Quality Team Specialist reviewed this patient medical record for the purposes of chart review for care gap closure. The following were reviewed: abstraction for care gap closure-controlling blood pressure.    [facility]BCI Quality Team

## 2024-08-08 ENCOUNTER — Other Ambulatory Visit (HOSPITAL_BASED_OUTPATIENT_CLINIC_OR_DEPARTMENT_OTHER): Payer: Self-pay

## 2024-08-09 ENCOUNTER — Encounter (INDEPENDENT_AMBULATORY_CARE_PROVIDER_SITE_OTHER): Payer: Self-pay

## 2024-08-09 ENCOUNTER — Ambulatory Visit (INDEPENDENT_AMBULATORY_CARE_PROVIDER_SITE_OTHER)

## 2024-08-09 VITALS — BP 109/71 | HR 63 | Temp 97.8°F | Ht 72.0 in | Wt 170.0 lb

## 2024-08-09 DIAGNOSIS — R0982 Postnasal drip: Secondary | ICD-10-CM

## 2024-08-09 DIAGNOSIS — G479 Sleep disorder, unspecified: Secondary | ICD-10-CM

## 2024-08-09 DIAGNOSIS — F5101 Primary insomnia: Secondary | ICD-10-CM

## 2024-08-09 DIAGNOSIS — H9313 Tinnitus, bilateral: Secondary | ICD-10-CM | POA: Diagnosis not present

## 2024-08-09 DIAGNOSIS — H9190 Unspecified hearing loss, unspecified ear: Secondary | ICD-10-CM

## 2024-08-09 MED ORDER — METHYLPREDNISOLONE 4 MG PO TBPK: ORAL_TABLET | ORAL | 0 refills | Status: DC

## 2024-08-09 NOTE — Progress Notes (Signed)
 Dear Dr. Tobie, Here is my assessment for our mutual patient, Craig Wagner. Thank you for allowing me the opportunity to care for your patient. Please do not hesitate to contact me should you have any other questions. Sincerely, Dr. Penne Croak  Otolaryngology Clinic Note Referring provider: Dr. Tobie HPI:  Discussed the use of AI scribe software for clinical note transcription with the patient, who gave verbal consent to proceed.  History of Present Illness Craig Wagner Craig Wagner is a 68 year old male who presents with tinnitus and sleep disturbance.  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) - Last hearing test in 2005  Sleep disturbance - Difficulty sleeping attributed to worsening tinnitus - Occasionally uses trazodone  for sleep  Nasal symptoms - Morning postnasal drip - Associated sneezing  Ear hygiene - Cleans ears weekly using a nasal aspirator with a rubber end - No significant cerumen accumulation  Medication use - No new medications initiated around onset of tinnitus - Current medications include Propecia  for hair loss, statins for hyperlipidemia, and occasional trazodone  for sleep  Cardiometabolic history - Hypertension and elevated blood glucose - No history of heart disease, stents, or arrhythmias  Independent Review of Additional Tests or Records:  Reviewed external note from referring PCP, Patel,describing RElevant history incorporated into today's evaluation. He has a complex medical history and medication list.  PMH/Meds/All/SocHx/FamHx/ROS:   Past Medical History:  Diagnosis Date   Degenerative joint disease    Diabetes mellitus  without complication (HCC)    Diverticulitis    Gout    Hypertension    Involuntary commitment 09/25/2022   Irritable bowel syndrome    Sleep apnea      Past Surgical History:  Procedure Laterality Date   BIOPSY  10/06/2020   Procedure: BIOPSY;  Surgeon: Cindie Carlin POUR, DO;  [facility];  Service: Endoscopy;;   COLONOSCOPY WITH PROPOFOL  N/A 10/06/2020   Colonoscopy with non-bleeding internal hemorrhoids. Many diverticula in entire colon. Localized mild inflammation in sigmoid colon. Colon mucosa with hyperemia, negative inflammation.    compound fx tibia and fibula     WRIST SURGERY Left     Family History  Problem Relation Age of Onset   Cancer Mother    Heart disease Father    Colon cancer Neg Hx    Colon polyps Neg Hx      Social Connections: Moderately Integrated (03/28/2018)   Social Connection and Isolation Panel    Frequency of Communication with Friends and Family: Once a week    Frequency of Social Gatherings with Friends and Family: Never    Attends Religious Services: More than 4 times per year    Active Member of [employer] or Organizations: Yes    Attends Banker Meetings: Never    Marital Status: Married      Current Outpatient Medications:    methylPREDNISolone  (MEDROL  DOSEPAK) 4 MG TBPK tablet, Take as directed, Disp: 1 each, Rfl: 0   amLODipine  (NORVASC ) 10 MG tablet, Take 1 tablet (10 mg total) by mouth daily., Disp: 30 tablet, Rfl: 2   Cholecalciferol (VITAMIN D3) 25 MCG (1000 UT) CAPS, Take 1 capsule (1,000 Units total) by mouth daily., Disp: 90 capsule, Rfl: 2   dapagliflozin  propanediol (FARXIGA ) 10 MG TABS tablet,  Take 1 tablet (10 mg total) by mouth daily., Disp: 90 tablet, Rfl: 1   famotidine  (PEPCID ) 20 MG tablet, Take 1 tablet (20 mg total) by mouth daily., Disp: 30 tablet, Rfl: 3   glimepiride  (AMARYL ) 1 MG tablet, Take 1 tablet (1 mg total) by mouth daily with breakfast., Disp: 30 tablet, Rfl: 2   ibuprofen  (ADVIL ) 200 MG  tablet, Take 200-400 mg by mouth every 6 (six) hours as needed for mild pain or headache., Disp: , Rfl:    traMADol (ULTRAM) 50 MG tablet, One po daily prn moderate knee pain, Disp: 30 tablet, Rfl: 2   traZODone  (DESYREL ) 50 MG tablet, TAKE 0.5-1 TABLETS BY MOUTH AT BEDTIME AS NEEDED FOR SLEEP., Disp: 90 tablet, Rfl: 2   Physical Exam:   BP 109/71   Pulse 63   Temp 97.8 F (36.6 C)   Ht 6' (1.829 m)   Wt 170 lb (77.1 kg)   SpO2 96%   BMI 23.06 kg/m   The patient was awake, alert, and appropriate. The external ears were inspected, and otoscopy was performed to evaluate the external auditory canals and tympanic membranes. The nasal cavity and septum were examined for mucosal changes, obstruction, or discharge. The oral cavity and oropharynx were inspected for mucosal lesions, infection, or tonsillar hypertrophy. The neck was palpated for lymphadenopathy, thyroid  abnormalities, or other masses. Cranial nerve function was grossly intact.  Pertinent Findings: Physical Exam HEENT: Minimal cerumen in ears. Nose normal. Oral cavity normal. NECK: Neck supple, no masses.   Seprately Identifiable Procedures:  I personally ordered, reviewed and interpreted the following with the patient today  Prior to initiating any procedures, risks/benefits/alternatives were explained to the patient and verbal consent obtained. None  Impression & Plans:  Craig Wagner is a 68 y.o. male  1. Tinnitus of both ears   2. Hearing loss, unspecified hearing loss type, unspecified laterality   3. Primary insomnia    - Findings and diagnoses discussed in detail with the patient. - Risks, benefits, and alternatives were reviewed. Through shared decision making, the patient elects to proceed with below.  Assessment & Plan Severe exacerbation of bilateral tinnitus, affecting sleep. No recent audiometric evaluation. Potential exacerbating factors include stress and caffeine. - Order audiometric evaluation. -  Discussed nortriptyline. Advised against it given his current medications and age - Advise use of white noise or tinnitus apps. - Discuss cognitive behavioral therapy. Patient declined. - Advise reducing caffeine intake. 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 - Complex medical history and medication list limiting options.  Sleep disturbance with worsening tinnitus. I suspect that this is contributing to his tinnitus. Nortriptyline discussed for sleep aid. Trazodone  considered with caution for interactions.  - Recommend increasing use of trazodone . - Consider steroid trial,, monitor mood changes.  - Orders placed:  Orders Placed This Encounter  Procedures   Ambulatory referral to Audiology   - Medications prescribed/continued/adjusted:  Meds ordered this encounter  Medications   methylPREDNISolone  (MEDROL  DOSEPAK) 4 MG TBPK tablet    Sig: Take as directed    Dispense:  1 each    Refill:  0   - Education materials provided to the patient. - Follow up: 6 weeks with audio. Patient instructed to return sooner or go to the ED if new/worsening symptoms develop.   Thank you for allowing me the opportunity to care for your patient. Please do not hesitate to contact me should you have any other questions.  Sincerely, Penne Croak, DO Otolaryngologist (  ENT) [facility] Phone: [phone] Fax: [phone]  08/09/2024, 3:59 PM

## 2024-08-15 DIAGNOSIS — C44319 Basal cell carcinoma of skin of other parts of face: Secondary | ICD-10-CM | POA: Diagnosis not present

## 2024-08-20 ENCOUNTER — Encounter (HOSPITAL_BASED_OUTPATIENT_CLINIC_OR_DEPARTMENT_OTHER): Payer: Self-pay

## 2024-08-23 ENCOUNTER — Encounter (HOSPITAL_BASED_OUTPATIENT_CLINIC_OR_DEPARTMENT_OTHER): Payer: Self-pay | Admitting: Family

## 2024-08-23 ENCOUNTER — Ambulatory Visit (HOSPITAL_BASED_OUTPATIENT_CLINIC_OR_DEPARTMENT_OTHER): Admitting: Family

## 2024-08-23 VITALS — BP 113/75 | HR 73 | Ht 72.0 in | Wt 169.6 lb

## 2024-08-23 DIAGNOSIS — I10 Essential (primary) hypertension: Secondary | ICD-10-CM | POA: Diagnosis not present

## 2024-08-23 DIAGNOSIS — E782 Mixed hyperlipidemia: Secondary | ICD-10-CM

## 2024-08-23 DIAGNOSIS — Z8249 Family history of ischemic heart disease and other diseases of the circulatory system: Secondary | ICD-10-CM | POA: Diagnosis not present

## 2024-08-23 MED ORDER — ATORVASTATIN CALCIUM 80 MG PO TABS: 80.0000 mg | ORAL_TABLET | Freq: Every day | ORAL | 3 refills | Status: AC

## 2024-08-23 NOTE — Patient Instructions (Addendum)
 Medication Instructions:  START Atorvastatin  80mg  once per day   Labwork: Your physician recommends that you return for lab work in 3 months for fasting lipid panel, ALT, Lp(a)   Testing/Procedures: If you would like to proceed with cardiac CTA for evaluation of your heart arteries and chest pain, please call and let us  know   Follow-Up: As needed with cardiology   Special Instructions:    Heart Healthy Diet Recommendations: A low-salt diet is recommended. Meats should be grilled, baked, or boiled. Avoid fried foods. Focus on lean protein sources like fish or chicken with vegetables and fruits. The American Heart Association is a Chief Technology Officer!  American Heart Association Diet and Lifeystyle Recommendations   Exercise recommendations: The American Heart Association recommends 150 minutes of moderate intensity exercise weekly. Try 30 minutes of moderate intensity exercise 4-5 times per week. This could include walking, jogging, or swimming.

## 2024-08-23 NOTE — Progress Notes (Signed)
 [facility] Initial Assessment:    Date:  08/23/2024   ID:  Craig Wagner, DOB [birth year], MRN [id number]  PCP:  Terry Wilhelmena Lloyd Hilario, FNP  Cardiologist:  None  Nephrologist:  Referring MD: Terry Wilhelmena Lloyd Michail*   CC: Hypertension  History of Present Illness:    Craig Wagner is a 69 y.o. male with a hx of hypertension,  sleep apnea, DM2, gout, HLD,  IVC (09/25/22)here to establish care in the [facility].   Referred by PCP. Initially referred 07/2023 and cancelled multiple consults. He was evaluated 06/27/24 by PCP with hypotension and Olmesartan -hydrochlorothiazide  40-25mg  stopped. Amlodipine  10mg  daily continued.   Presents today for follow up. Reports BP well controlled at home. Tolerates amlodipine  without issue. Reports occasional left sided chest pain for many years described as sharp, occurs at rest, independently resolves. No exertional chest pain nor discomfort. Not associated with dyspnea, diaphoresis. Exercise limited by orthopedic limitations, upcoming visit to establish with EP.   Previous antihypertensives: Amlodipine  Olmesartan -hydrochlorothiazide   Past Medical History:  Diagnosis Date   Degenerative joint disease    Diabetes mellitus without complication (HCC)    Diverticulitis    Gout    Hypertension    Involuntary commitment 09/25/2022   Irritable bowel syndrome    Sleep apnea     Past Surgical History:  Procedure Laterality Date   BIOPSY  10/06/2020   Procedure: BIOPSY;  Surgeon: Cindie Carlin POUR, DO;  Location: [facility];  Service: Endoscopy;;   COLONOSCOPY WITH PROPOFOL  N/A 10/06/2020   Colonoscopy with non-bleeding internal hemorrhoids. Many diverticula in entire colon. Localized mild inflammation in sigmoid colon. Colon mucosa with hyperemia, negative inflammation.    compound fx tibia and fibula     WRIST SURGERY Left     Current Medications: Current Meds  Medication Sig   amLODipine  (NORVASC )  10 MG tablet Take 1 tablet (10 mg total) by mouth daily.   atorvastatin  (LIPITOR) 80 MG tablet Take 1 tablet (80 mg total) by mouth daily.   Cholecalciferol (VITAMIN D3) 25 MCG (1000 UT) CAPS Take 1 capsule (1,000 Units total) by mouth daily.   dapagliflozin  propanediol (FARXIGA ) 10 MG TABS tablet Take 1 tablet (10 mg total) by mouth daily.   famotidine  (PEPCID ) 20 MG tablet Take 1 tablet (20 mg total) by mouth daily.   glimepiride  (AMARYL ) 1 MG tablet Take 1 tablet (1 mg total) by mouth daily with breakfast.   ibuprofen  (ADVIL ) 200 MG tablet Take 200-400 mg by mouth every 6 (six) hours as needed for mild pain or headache.   methylPREDNISolone  (MEDROL  DOSEPAK) 4 MG TBPK tablet Take as directed   traMADol (ULTRAM) 50 MG tablet One po daily prn moderate knee pain   traZODone  (DESYREL ) 50 MG tablet TAKE 0.5-1 TABLETS BY MOUTH AT BEDTIME AS NEEDED FOR SLEEP.     Allergies:   Patient has no known allergies.   Social History   Socioeconomic History   Marital status: Married    Spouse name: Not on file   Number of children: Not on file   Years of education: Not on file   Highest education level: Not on file  Occupational History   Not on file  Tobacco Use   Smoking status: Never   Smokeless tobacco: Never  Vaping Use   Vaping status: Never Used  Substance and Sexual Activity   Alcohol use: Yes    Alcohol/week: 1.0 standard drink of alcohol    Types: 1 Cans of beer per week  Drug use: No   Sexual activity: Not Currently    Partners: Female  Other Topics Concern   Not on file  Social History Narrative   Grew up in [place]    Moved to [place]  for school.   Father was a stage manager in the [employer].   Works part-time.   Married for 28 years.    Does not have children.   Has a lot of pets.   Takes care of mules, dogs, pets.   Has a motorcycle.       Social Drivers of Corporate Investment Banker Strain: Low Risk  (03/28/2018)   Overall Financial Resource Strain (CARDIA)    Difficulty of  Paying Living Expenses: Not hard at all  Food Insecurity: No Food Insecurity (08/23/2024)   Hunger Vital Sign    Worried About Running Out of Food in the Last Year: Never true    Ran Out of Food in the Last Year: Never true  Transportation Needs: No Transportation Needs (08/23/2024)   PRAPARE - Administrator, Civil Service (Medical): No    Lack of Transportation (Non-Medical): No  Physical Activity: Inactive (08/23/2024)   Exercise Vital Sign    Days of Exercise per Week: 0 days    Minutes of Exercise per Session: 0 min  Stress: No Stress Concern Present (08/23/2024)   [employer] of Occupational Health - Occupational Stress Questionnaire    Feeling of Stress: Not at all  Social Connections: Moderately Integrated (03/28/2018)   Social Connection and Isolation Panel    Frequency of Communication with Friends and Family: Once a week    Frequency of Social Gatherings with Friends and Family: Never    Attends Religious Services: More than 4 times per year    Active Member of [employer] or Organizations: Yes    Attends Banker Meetings: Never    Marital Status: Married     Family History: The patient's family history includes Cancer in his mother; Heart disease in his father. There is no history of Colon cancer or Colon polyps.  ROS:   Please see the history of present illness.     All other systems reviewed and are negative.  EKGs/Labs/Other Studies Reviewed:         Recent Labs: 06/12/2024: ALT 19; BUN 21; Creatinine, Ser 1.60; Hemoglobin 16.1; Platelets 158; Potassium 4.0; Sodium 141   Recent Lipid Panel    Component Value Date/Time   CHOL 254 (H) 06/12/2024 1039   TRIG 141 06/12/2024 1039   HDL 47 06/12/2024 1039   CHOLHDL 5.4 (H) 06/12/2024 1039   CHOLHDL 3.7 07/06/2018 1339   LDLCALC 181 (H) 06/12/2024 1039   LDLCALC 65 07/06/2018 1339    Physical Exam:   VS:  BP 113/75 (BP Location: Right Arm, Patient Position: Sitting, Cuff Size: Normal)    Pulse 73   Ht 6' (1.829 m)   Wt 169 lb 9.6 oz (76.9 kg)   SpO2 96%   BMI 23.00 kg/m  , BMI Body mass index is 23 kg/m.  Vitals:   08/23/24 0825 08/23/24 0828  BP: 113/75 113/75  Pulse: 75 73  Height: 6' (1.829 m)   Weight: 169 lb 9.6 oz (76.9 kg)   SpO2: 96%   BMI (Calculated): 23     GENERAL:  Well appearing HEENT: Pupils equal round and reactive, fundi not visualized, oral mucosa unremarkable NECK:  No jugular venous distention, waveform within normal limits, carotid upstroke brisk and symmetric, no bruits, no  thyromegaly LYMPHATICS:  No cervical adenopathy LUNGS:  Clear to auscultation bilaterally HEART:  RRR.  PMI not displaced or sustained,S1 and S2 within normal limits, no S3, no S4, no clicks, no rubs, no murmurs ABD:  Flat, positive bowel sounds normal in frequency in pitch, no bruits, no rebound, no guarding, no midline pulsatile mass, no hepatomegaly, no splenomegaly EXT:  2 plus pulses throughout, no edema, no cyanosis no clubbing SKIN:  No rashes no nodules NEURO:  Cranial nerves II through XII grossly intact, motor grossly intact throughout PSYCH:  Cognitively intact, oriented to person place and time   ASSESSMENT/PLAN:    HTN - BP at goal <130/80. Continue amlodipine  10mg  daily. As BP controlled on one agent, no indication for secondary hypertension workup.  HLD - lipid panel concerning for familial hyperlipidemia with 05/2024 LDL 81. Resume atorvastatin  80mg  daily. Labs in 3 months fasting lipid panel, ALT, Lp(a).  DM2 - Continue to follow with PCP.   Atypical chest pain - longstanding history of left sided sharp chest pain at rest that is intermittent and self resolves. Atypical for angina as occurs at rest. Likely etiology muscle spasm. Discussed possible cardiac CTA, as symptoms are atypical and infrequent plan for monitoring at this time. If symptoms worsen, he will contact office and we can plan for cardiac CTA  Screening for Secondary Hypertension:      Relevant Labs/Studies:    Latest Ref Rng & Units 06/12/2024   10:39 AM 08/11/2023    3:34 PM 03/29/2023    3:26 PM  Basic Labs  Sodium 134 - 144 mmol/L 141  143  139   Potassium 3.5 - 5.2 mmol/L 4.0  4.3  3.8   Creatinine 0.76 - 1.27 mg/dL 8.39  8.60  8.55        Latest Ref Rng & Units 03/29/2023    3:26 PM 03/28/2018    3:44 PM  Thyroid    TSH 0.450 - 4.500 uIU/mL 3.180  3.16                  Disposition:    FU with MD/APP/PharmD as needed   Medication Adjustments/Labs and Tests Ordered: Current medicines are reviewed at length with the patient today.  Concerns regarding medicines are outlined above.  Orders Placed This Encounter  Procedures   ALT   Lipid panel   Lipoprotein A (LPA)   Meds ordered this encounter  Medications   atorvastatin  (LIPITOR) 80 MG tablet    Sig: Take 1 tablet (80 mg total) by mouth daily.    Dispense:  90 tablet    Refill:  3    Supervising Provider:   LONNI SLAIN [[id number]]     Signed, Reche GORMAN Finder, NP  08/23/2024 1:00 PM    [facility]

## 2024-08-27 ENCOUNTER — Ambulatory Visit: Payer: Self-pay

## 2024-08-27 NOTE — Telephone Encounter (Signed)
Noted patient scheduled

## 2024-08-27 NOTE — Telephone Encounter (Signed)
 FYI Only or Action Required?: FYI only for provider: appointment scheduled on 08/28/2024.  Patient was last seen in primary care on 06/27/2024 by Tobie Suzzane POUR, MD.  Called Nurse Triage reporting Toe Pain.  Symptoms began several days ago.  Interventions attempted: Nothing.  Symptoms are: gradually worsening.  Triage Disposition: See PCP When Office is Open (Within 3 Days)  Patient/caregiver understands and will follow disposition?: Yes         Copied from CRM [id number]. Topic: Clinical - Red Word Triage >> Aug 27, 2024  8:55 AM Jeoffrey H wrote: [facility] that prompted transfer to Nurse Triage: Patient called and stated his right 4th toe started hurting Wednesday and Saturday it started turning blue. No other symptoms. He stated he did not hit it or anything. Reason for Disposition  [1] MODERATE pain (e.g., interferes with normal activities, limping) AND [2] present > 3 days  Answer Assessment - Initial Assessment Questions Patient states he has feeling in the toe denies numbness or weakness. Pt states the area is still painful to touch. Not taking or using any thing for symptoms.     1. ONSET: When did the pain start?      Wednesday  2. LOCATION: Where is the pain located?   (e.g., around nail, entire toe, at foot joint)      4th toe on R foot  3. PAIN: How bad is the pain?    (Scale 1-10; or mild, moderate, severe)     Had pain on Wednesday that was moderate denies pain currently  4. APPEARANCE: What does the toe look like? (e.g., redness, swelling, bruising, pallor)     States the area is blue in color  5. CAUSE: What do you think is causing the toe pain?     Unsure 6. OTHER SYMPTOMS: Do you have any other symptoms? (e.g., leg pain, rash, fever, numbness)     R Knee area is painful  Protocols used: Toe Pain-A-AH

## 2024-08-28 ENCOUNTER — Ambulatory Visit: Payer: Self-pay

## 2024-08-28 VITALS — BP 105/74 | HR 87 | Ht 72.0 in | Wt 169.0 lb

## 2024-08-28 DIAGNOSIS — M457 Ankylosing spondylitis of lumbosacral region: Secondary | ICD-10-CM | POA: Diagnosis not present

## 2024-08-28 DIAGNOSIS — N1831 Chronic kidney disease, stage 3a: Secondary | ICD-10-CM | POA: Diagnosis not present

## 2024-08-28 DIAGNOSIS — I10 Essential (primary) hypertension: Secondary | ICD-10-CM

## 2024-08-28 DIAGNOSIS — L089 Local infection of the skin and subcutaneous tissue, unspecified: Secondary | ICD-10-CM

## 2024-08-28 DIAGNOSIS — Z7984 Long term (current) use of oral hypoglycemic drugs: Secondary | ICD-10-CM | POA: Diagnosis not present

## 2024-08-28 DIAGNOSIS — E1122 Type 2 diabetes mellitus with diabetic chronic kidney disease: Secondary | ICD-10-CM

## 2024-08-28 DIAGNOSIS — F332 Major depressive disorder, recurrent severe without psychotic features: Secondary | ICD-10-CM

## 2024-08-28 MED ORDER — GLIMEPIRIDE 1 MG PO TABS: 1.0000 mg | ORAL_TABLET | Freq: Every day | ORAL | 2 refills | Status: AC

## 2024-08-28 MED ORDER — AMLODIPINE BESYLATE 10 MG PO TABS: 10.0000 mg | ORAL_TABLET | Freq: Every day | ORAL | 2 refills | Status: AC

## 2024-08-28 MED ORDER — CEPHALEXIN 500 MG PO CAPS: 500.0000 mg | ORAL_CAPSULE | Freq: Three times a day (TID) | ORAL | 0 refills | Status: DC

## 2024-08-28 NOTE — Progress Notes (Signed)
 Established Patient Office Visit  Subjective   Patient ID: Craig Wagner, male    DOB: [birth year]  Age: 68 y.o. MRN: [id number]  Chief Complaint  Patient presents with   Medical Management of Chronic Issues    Toe Pain    HPI Discussed the use of AI scribe software for clinical note transcription with the patient, who gave verbal consent to proceed.  History of Present Illness    Craig Wagner TOM is a 68 year old male with spinal degeneration and diabetes who presents with balance issues and medication refills.  Gait instability and lower back pain - Intermittent lower back pain attributed to spinal degeneration and stenosis - MRI and neurologist's reports confirm degeneration and stenosis in the lower spine - Balance impairment requiring use of a walker - Persistent pain and limited mobility  Diabetes mellitus management - Requires medication refills for diabetes - Uncertainty regarding which blood pressure medication was discontinued after a previous low reading - Medications are out of sync - Recently ran out of avastatin  Depressive symptoms - High depression screening result - Attribution of depressive symptoms to bereavement following the loss of his wife and a friend due to medical complications - Describes daily life as a struggle with persistent pain and limited mobility - No good days since his wife's passing  Dietary habits - Primarily consumes hamburger and chicken - Frequently prepares soup from chicken stock - Daily highlights include visiting the doctor or going to the grocery store     Patient Active Problem List   Diagnosis Date Noted   Right foot pain 06/29/2024   Chest pain 06/27/2024   Peripheral neuropathy 06/27/2024   Chronic gastritis without bleeding 06/27/2024   Tinnitus of both ears 06/27/2024   Primary insomnia 06/12/2024   Gait disturbance 06/12/2024   Mixed hyperlipidemia 06/12/2024   Pain and swelling of left knee 03/29/2023    MDD (major depressive disorder), recurrent severe, without psychosis (HCC) 09/28/2022   MDD (major depressive disorder), recurrent episode, severe (HCC) 09/25/2022   Involuntary commitment 09/25/2022   Chronic diarrhea 11/19/2020   Type 2 diabetes mellitus with hyperglycemia, without long-term current use of insulin  (HCC) 06/08/2018   Depression, recurrent 06/08/2018   Type 2 diabetes mellitus with diabetic chronic kidney disease (HCC) 06/08/2018   Essential hypertension 06/08/2018   Ankylosing spondylitis (HCC) 10/07/2015   Osteoarthritis of left knee 10/07/2015   Osteoarthritis of ankle 10/07/2015   Gout 10/07/2015    ROS    Objective:     BP 105/74   Pulse 87   Ht 6' (1.829 m)   Wt 169 lb (76.7 kg)   SpO2 96%   BMI 22.92 kg/m  BP Readings from Last 3 Encounters:  08/28/24 105/74  08/23/24 113/75  08/09/24 109/71   Wt Readings from Last 3 Encounters:  08/28/24 169 lb (76.7 kg)  08/23/24 169 lb 9.6 oz (76.9 kg)  08/09/24 170 lb (77.1 kg)     Physical Exam Vitals and nursing note reviewed.  Constitutional:      Appearance: Normal appearance.  HENT:     Head: Normocephalic.     Right Ear: Tympanic membrane, ear canal and external ear normal.     Left Ear: Tympanic membrane, ear canal and external ear normal.     Nose: Nose normal.     Mouth/Throat:     Mouth: Mucous membranes are moist.     Pharynx: Oropharynx is clear.  Cardiovascular:     Rate and Rhythm: Normal  rate and regular rhythm.  Pulmonary:     Effort: Pulmonary effort is normal.     Breath sounds: Normal breath sounds.  Musculoskeletal:     Cervical back: Normal range of motion and neck supple.  Feet:     Right foot:     Skin integrity: Skin integrity normal.     Left foot:     Skin integrity: Erythema (dorsal aspect of fourth toe.) and warmth present. No skin breakdown.  Skin:    General: Skin is warm and dry.  Neurological:     Mental Status: He is alert and oriented to person, place, and  time.  Psychiatric:        Mood and Affect: Mood normal.        Thought Content: Thought content normal.         The 10-year ASCVD risk score (Arnett DK, et al., 2019) is: 24.9%    Assessment & Plan:   Problem List Items Addressed This Visit       Cardiovascular and Mediastinum   Essential hypertension   Blood pressure well-controlled with current medication regimen. - Continue current blood pressure medication regimen.      Relevant Medications   amLODipine  (NORVASC ) 10 MG tablet     Endocrine   Type 2 diabetes mellitus with diabetic chronic kidney disease (HCC) - Primary   Diabetes management requires monitoring of A1c and kidney function. - Rechecked A1c and kidney function.      Relevant Medications   glimepiride  (AMARYL ) 1 MG tablet   Other Relevant Orders   Basic Metabolic Panel (BMET) (Completed)   HgB A1c (Completed)     Musculoskeletal and Integument   Ankylosing spondylitis (HCC)   Degeneration in lower spine and stenosis in neck. Symptoms include balance issues requiring walker.           Other   MDD (major depressive disorder), recurrent severe, without psychosis (HCC)   High depression screening score. Significant life stressors including loss of wife and friend. Reports ongoing emotional distress and difficulty coping. - Documented depression screening results and patient's response.      Other Visit Diagnoses       Local skin infection       Concern for local infection with color change resembling bruising. No tenderness reported. - Prescribed Keflex.   Relevant Medications   cephALEXin (KEFLEX) 500 MG capsule       Return in about 4 months (around 12/26/2024) for chronic follow-up with PCP.    Leita Longs, FNP

## 2024-08-29 LAB — BASIC METABOLIC PANEL WITH GFR
BUN/Creatinine Ratio: 22 (ref 10–24)
BUN: 35 mg/dL — ABNORMAL HIGH (ref 8–27)
CO2: 24 mmol/L (ref 20–29)
Calcium: 10.3 mg/dL — ABNORMAL HIGH (ref 8.6–10.2)
Chloride: 97 mmol/L (ref 96–106)
Creatinine, Ser: 1.6 mg/dL — ABNORMAL HIGH (ref 0.76–1.27)
Glucose: 172 mg/dL — ABNORMAL HIGH (ref 70–99)
Potassium: 3.9 mmol/L (ref 3.5–5.2)
Sodium: 138 mmol/L (ref 134–144)
eGFR: 47 mL/min/1.73 — ABNORMAL LOW (ref >59)

## 2024-08-29 LAB — HEMOGLOBIN A1C
Est. average glucose Bld gHb Est-mCnc: 148 mg/dL
Hgb A1c MFr Bld: 6.8 % — ABNORMAL HIGH (ref 4.8–5.6)

## 2024-09-02 ENCOUNTER — Ambulatory Visit: Payer: Self-pay

## 2024-09-02 NOTE — Assessment & Plan Note (Signed)
 Diabetes management requires monitoring of A1c and kidney function. - Rechecked A1c and kidney function.

## 2024-09-02 NOTE — Assessment & Plan Note (Signed)
 Blood pressure well-controlled with current medication regimen. - Continue current blood pressure medication regimen.

## 2024-09-02 NOTE — Assessment & Plan Note (Signed)
 High depression screening score. Significant life stressors including loss of wife and friend. Reports ongoing emotional distress and difficulty coping. - Documented depression screening results and patient's response.

## 2024-09-02 NOTE — Assessment & Plan Note (Signed)
 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 Symptoms include balance issues requiring walker.

## 2024-09-05 NOTE — Therapy (Signed)
 OUTPATIENT PHYSICAL THERAPY LOWER EXTREMITY EVALUATION   Patient Name: Rawn Quiroa MRN: [id number] DOB:December 27, 1955, 68 y.o., male Today's Date: 09/07/2024  END OF SESSION:  PT End of Session - 09/07/24 1325     Visit Number 1    Number of Visits 12    Date for Recertification  10/19/24    Authorization Type [employer] medicare    Authorization Time Period no auth needed    PT Start Time 1326    PT Stop Time 1411    PT Time Calculation (min) 45 min    Activity Tolerance Patient tolerated treatment well    Behavior During Therapy [facility] for tasks assessed/performed          Past Medical History:  Diagnosis Date   Degenerative joint disease    Diabetes mellitus without complication (HCC)    Diverticulitis    Gout    Hypertension    Involuntary commitment 09/25/2022   Irritable bowel syndrome    Sleep apnea    Past Surgical History:  Procedure Laterality Date   BIOPSY  10/06/2020   Procedure: BIOPSY;  Surgeon: Cindie Carlin POUR, DO;  Location: [facility];  Service: Endoscopy;;   COLONOSCOPY WITH PROPOFOL  N/A 10/06/2020   Colonoscopy with non-bleeding internal hemorrhoids. Many diverticula in entire colon. Localized mild inflammation in sigmoid colon. Colon mucosa with hyperemia, negative inflammation.    compound fx tibia and fibula     WRIST SURGERY Left    Patient Active Problem List   Diagnosis Date Noted   Right foot pain 06/29/2024   Chest pain 06/27/2024   Peripheral neuropathy 06/27/2024   Chronic gastritis without bleeding 06/27/2024   Tinnitus of both ears 06/27/2024   Primary insomnia 06/12/2024   Gait disturbance 06/12/2024   Mixed hyperlipidemia 06/12/2024   Pain and swelling of left knee 03/29/2023   MDD (major depressive disorder), recurrent severe, without psychosis (HCC) 09/28/2022   MDD (major depressive disorder), recurrent episode, severe (HCC) 09/25/2022   Involuntary commitment 09/25/2022   Chronic diarrhea 11/19/2020   Type 2 diabetes  mellitus with hyperglycemia, without long-term current use of insulin  (HCC) 06/08/2018   Depression, recurrent 06/08/2018   Type 2 diabetes mellitus with diabetic chronic kidney disease (HCC) 06/08/2018   Essential hypertension 06/08/2018   Ankylosing spondylitis (HCC) 10/07/2015   Osteoarthritis of left knee 10/07/2015   Osteoarthritis of ankle 10/07/2015   Gout 10/07/2015    PCP: Terry Wilhelmena Lloyd Hilario, FNP  REFERRING PROVIDER: Vear Charlie LABOR, MD  REFERRING DIAG: R26.9 (ICD-10-CM) - Gait disturbance R27.0 (ICD-10-CM) - Ataxia of right upper extremity G31.9 (ICD-10-CM) - Cerebellar atrophy  THERAPY DIAG:  Difficulty in walking, not elsewhere classified  Imbalance  Other abnormalities of gait and mobility  Muscle weakness (generalized)  Ataxia  Rationale for Evaluation and Treatment: Rehabilitation  ONSET DATE: couple months  SUBJECTIVE:   SUBJECTIVE STATEMENT: Clemens down about a year ago and hurt his right knee; lost some use of right hand from the fall as well.  Last couple months can't maintain standing balance without the walker.  Has a cane as well.  Dr. Tobie referred to Dr. Vear; MRI showed cerebellar atrophy  PERTINENT HISTORY: DM, gout, HTN; depression PAIN:  Are you having pain? Yes: NPRS scale: 3/10 Pain location: all over Pain description: sore Aggravating factors: none Relieving factors: heat and Aleve  PRECAUTIONS: Fall  RED FLAGS: None   WEIGHT BEARING RESTRICTIONS: No  FALLS:  Has patient fallen in last 6 months? Yes. Number of falls 2  LIVING ENVIRONMENT: Lives with: lives alone Lives in: House/apartment Stairs: Yes: Internal: 20 steps; on right going up, on left going up, and can reach both and External: 7 steps; on right going up, on left going up, and can reach both Has following equipment at home: Single point cane, Walker - 2 wheeled, shower chair, and Grab bars  OCCUPATION: retired  PLOF: Independent with household mobility  with device  PATIENT GOALS: be able to do what normal people do  NEXT MD VISIT: 1 year  OBJECTIVE:  Note: Objective measures were completed at Evaluation unless otherwise noted.  DIAGNOSTIC FINDINGS:  IMPRESSION:    MRI brain without contrast demonstrating: - Fourth ventricle is moderate to severely enlarged, with asymmetric enlargement towards the right side.  Posterior fossa midline arachnoid cyst noted slightly to the left of midline measuring 3.4 x 2.2 cm on axial views.  Moderate-severe cerebellar vermian atrophy noted on sagittal views. Hazy T2 FLAIR hyperintensity within the right cerebellar hemisphere.  These findings have significantly progressed since 09/24/2022.  Considerations include underlying cerebellar neurodegeneration syndrome, post-ischemic or post-inflammatory changes. - Mild chronic small vessel ischemic disease. - No acute findings.     INTERPRETING PHYSICIAN:  EDUARD FABIENE HANLON, MD Certified in Neurology, Neurophysiology and Neuroimaging   [facility] [address], [address] [place], [place] [address] [phone]  Limited views of the soft tissues of the head and neck are unremarkable except for posterior fossa arachnoid cyst, cerebellar atrophy and abnormal signal within the right cerebral hemisphere; these findings are better visualized on MRI brain from the same day.     IMPRESSION:    MRI cervical spine without contrast demonstrating: - At C5-6 disc bulging and facet hypertrophy with moderate spinal stenosis and severe bilateral foraminal stenosis; no cord signal abnormalities. - At C3-4 uncovertebral joint hypertrophy with moderate bilateral foraminal stenosis. - At C4-5 disc bulging facet hypertrophy with severe bilateral foraminal stenosis.  MR LUMBAR SPINE WO CONTRAST (Accession [id number]) (Order [id number]) Imaging Date: 07/12/2024 Department: [facility]  MRI Imaging Released By: Jamesetta Joen CROME Authorizing: Vear Charlie LABOR, MD   Exam Status  Status  Final [99]   PACS Intelerad Image Link   Show images for MR LUMBAR SPINE WO CONTRAST Study Result  Narrative & Impression  [facility]   NEUROIMAGING REPORT     STUDY DATE: 07/12/24 PATIENT NAME: Johnavon Mcclafferty DOB: [birth year] MRN: [id number]   ORDERING CLINICIAN: Vear Charlie LABOR, MD  CLINICAL HISTORY: 68 y.o. year old male with: 1. Gait disturbance   2. Right leg weakness   3. Lumbar radiculopathy       EXAM: MR LUMBAR SPINE WO CONTRAST  TECHNIQUE: MRI of the lumbar spine was obtained utilizing multiplanar, multiecho pulse sequences. CONTRAST:  Diagnostic Product Medications (last 72 hours)       None         COMPARISON: 10/31/15 MRI   IMAGING SITE:  IMAGING DRI [facility]  MRI Imaging [address] [place] [address]       FINDINGS:    On sagittal views the vertebral bodies have normal height and alignment.  The conus medullaris terminates at the level of L1.     On axial views:  T12-L1: No spinal stenosis or foraminal narrowing  L1-2: Disc bulging with mild right foraminal stenosis L2-3: Disc bulging and facet hypertrophy with mild bilateral foraminal stenosis L3-4: Disc bulging and facet hypertrophy with mild spinal stenosis and moderate bilateral foraminal stenosis L4-5: Disc  bulging and facet hypertrophy with mild spinal stenosis and moderate right and severe left foraminal stenosis L5-S1: Disc bulging and facet hypertrophy with mild right and severe left foraminal stenosis   Limited views of the aorta, kidneys, iliopsoas muscles and sacroiliac joints are unremarkable.     IMPRESSION:    MRI lumbar spine (without) demonstrating: - At L4-5: Disc bulging and facet hypertrophy with mild spinal stenosis and moderate right and severe left foraminal stenosis. - At L3-4: Disc bulging and facet hypertrophy with mild spinal stenosis and moderate bilateral foraminal stenosis. - Minimal  progression of degenerative changes compared to 10/31/2015.             PATIENT SURVEYS:  ABC scale: The Activities-Specific Balance Confidence (ABC) Scale 0% 10 20 30  40 50 60 70 80 90 100% No confidence<->completely confident  "How confident are you that you will not lose your balance or become unsteady when you . . .   Date tested 09/07/2024                                                  Total: #/16 84.4%     COGNITION: Overall cognitive status: Within functional limits for tasks assessed     SENSATION: WFL  EDEMA:   Coordination heel to shin impaired right on left; impaired right hand finger to nose    POSTURE: rounded shoulders, forward head, and flexed trunk   PALPATION:   LOWER EXTREMITY ROM:  Active ROM Right eval Left eval  Hip flexion    Hip extension    Hip abduction    Hip adduction    Hip internal rotation    Hip external rotation    Knee flexion    Knee extension Extension lag   Ankle dorsiflexion    Ankle plantarflexion    Ankle inversion    Ankle eversion     (Blank rows = not tested)  LOWER EXTREMITY MMT:  MMT Right eval Left eval  Hip flexion 4+ 5  Hip extension    Hip abduction    Hip adduction    Hip internal rotation    Hip external rotation    Knee flexion    Knee extension 4 (extension lag) 5  Ankle dorsiflexion 4+ 5  Ankle plantarflexion    Ankle inversion    Ankle eversion     (Blank rows = not tested)   FUNCTIONAL TESTS:   SLS unable either foot 5 times sit to stand: 32.62 sec using hands to assist up Timed up and go (TUG): 30.68 with RW Dynamic Gait Index: Dynamic Gait Index; performed with cane and CGA to min A  Mark the lowest level that applies.   Date Performed 09/07/2024  Gait level surface (1) Moderate Impairment: Walks 20', slow speed, abnormal gait pattern, evidence for imbalance.  2. Change in gait speed (1) Moderate Impairment: Makes only minor adjustments to walking speed, or  accomplishes a change in speed with significant gait deviations, or changes speed but has significant gait deviations, or changes speed but loses balance but is able to recover and continue walking  3. Gait with horizontal head turns (1) Moderate Impairment: Performs head turns with moderate change in gait velocity, slows down, staggers but recovers, can continue to walk  4. Gait with vertical head turns (1) Moderate Impairment: Performs head turns with moderate change  in gait velocity, slows down, staggers but recovers, can continue to walk  5. Gait and pivot turn (2) Mild Impairment: Pivot turns safely in > 3 seconds and stops with no loss of balance  6. Step over obstacle (0) Severe Impairment: Cannot perform without assistance  7. Step around obstacle (1) Moderate Impairment: Is able to clear cones but must significantly slow, speed to accomplish task, or requires verbal cueing  8. Steps (1) Moderate Impairment: Two feet to a stair, must use rail  Total score 8/24    Score Interpretation: Score of <19 indicates high risk of falls.  Minimally Clinically Important Difference (MCID):  =DGI scores of<21/24 = 1.80 points DGI scores of >21/24 = 0.60 points   [place] T, Inbar-Borovsky N, Brozgol M, Giladi N, [place] JM. The Dynamic Gait Index in healthy older adults: the role of stair climbing, fear of falling and gender. Gait Posture. 2009 Feb;29(2):237-41. doi: 10.1016/j.gaitpost.2008.08.013. Epub 2008 Oct 8. PMID: 81154560; PMCID: EFR7290501.  Pardasaney, MYRTIS LOIS Bonus, GEANNIE POUR., et al. (2012). Sensitivity to change and responsiveness of four balance measures for community-dwelling older adults. Physical therapy 92(3): 388-397.   GAIT: Distance walked: 60 ft in clinic Assistive device utilized: Walker - 2 wheeled Level of assistance: SBA Comments: slow gait speed; decreased control right side; slight foot drop right                                                                                                                                 TREATMENT DATE: 09/07/2024 physical therapy evaluation and HEP instruction    PATIENT EDUCATION:  Education details: Patient educated on exam findings, POC, scope of PT, HEP, and what to expect next visit. Person educated: Patient Education method: Explanation, Demonstration, and Handouts Education comprehension: verbalized understanding, returned demonstration, verbal cues required, and tactile cues required  HOME EXERCISE PROGRAM: Access Code: [email or web address] URL: [email or web address] Date: 09/07/2024 Prepared by: [facility] - [facility]  Exercises - Standing March with Counter Support  - 2 x daily - 7 x weekly - 2 sets - 10 reps - Standing Ankle Plantar Flexion Dorsiflexion with Counter Support  - 2 x daily - 7 x weekly - 2 sets - 10 reps  ASSESSMENT:  CLINICAL IMPRESSION: Patient is a 68 y.o. male who was seen today for physical therapy evaluation and treatment for R26.9 (ICD-10-CM) - Gait disturbance R27.0 (ICD-10-CM) - Ataxia of right upper extremity G31.9 (ICD-10-CM) - Cerebellar atrophy. Patient demonstrates decreased strength, balance deficits and gait abnormalities which are negatively impacting patient ability to perform ADLs and functional mobility tasks. Patient will benefit from skilled physical therapy services to address these deficits to improve level of function with ADLs, functional mobility tasks, and reduce risk for falls.    OBJECTIVE IMPAIRMENTS: Abnormal gait, decreased balance, difficulty walking, decreased strength, increased fascial restrictions, impaired perceived functional ability, and pain.   ACTIVITY LIMITATIONS: carrying, lifting, bending, standing, stairs, transfers, and locomotion level  PARTICIPATION LIMITATIONS: meal  prep, cleaning, laundry, driving, shopping, and community activity  REHAB POTENTIAL: Good  CLINICAL DECISION MAKING: Evolving/moderate complexity  EVALUATION COMPLEXITY:  Moderate   GOALS: Goals reviewed with patient? No  SHORT TERM GOALS: Target date: 09/28/2024 patient will be independent with initial HEP and compliant with HEP 3-4 times a week   Baseline: Goal status: INITIAL  2.  Patient will report 30% improvement overall  Baseline:  Goal status: INITIAL  3.  Patient will able to stand SLS on each leg x 5 to demonstrate improved functional balance  Baseline:  Goal status: INITIAL  LONG TERM GOALS: Target date: 10/19/2024  Patient will be independent in self management strategies to improve quality of life and functional outcomes. Baseline:  Goal status: INITIAL  2.  Patient will report 30% improvement overall Baseline:  Goal status: INITIAL  3.  Patient will able to stand SLS on each leg x 10 to demonstrate improved functional balance  Baseline:  Goal status: INITIAL  4.  Patient will improve his DGI score to 17/24 to demonstrate improved functional mobility and balance and decreased fall risk  Baseline: 8/24 Goal status: INITIAL  5.  Patient will ambulate with cane 224 ft  level surfaces safely and independently Baseline: unable Goal status: INITIAL   PLAN:  PT FREQUENCY: 2x/week  PT DURATION: 6 weeks  PLANNED INTERVENTIONS: 97164- PT Re-evaluation, 97110-Therapeutic exercises, 97530- Therapeutic activity, 97112- Neuromuscular re-education, 97535- Self Care, 02859- Manual therapy, [id number]- Gait training, [phone]- Orthotic Fit/training, [phone]- Canalith repositioning, [id number]- Aquatic Therapy, 97760- Splinting, [phone]- Wound care (first 20 sq cm), 97598- Wound care (each additional 20 sq cm)Patient/Family education, Balance training, Stair training, Taping, Dry Needling, Joint mobilization, Joint manipulation, Spinal manipulation, Spinal mobilization, Scar mobilization, and DME instructions.   PLAN FOR NEXT SESSION: Review HEP and goals; gait and balance training; lower extremity strengthening.   2:09 PM, 09/07/24 Alizea Pell Small  Kenitha Glendinning MPT [facility] [place] [phone]

## 2024-09-07 ENCOUNTER — Ambulatory Visit (HOSPITAL_COMMUNITY): Attending: Neurology

## 2024-09-07 ENCOUNTER — Other Ambulatory Visit: Payer: Self-pay

## 2024-09-07 ENCOUNTER — Other Ambulatory Visit: Payer: Self-pay | Admitting: Internal Medicine

## 2024-09-07 DIAGNOSIS — R269 Unspecified abnormalities of gait and mobility: Secondary | ICD-10-CM | POA: Diagnosis not present

## 2024-09-07 DIAGNOSIS — R262 Difficulty in walking, not elsewhere classified: Secondary | ICD-10-CM | POA: Insufficient documentation

## 2024-09-07 DIAGNOSIS — G319 Degenerative disease of nervous system, unspecified: Secondary | ICD-10-CM | POA: Insufficient documentation

## 2024-09-07 DIAGNOSIS — R270 Ataxia, unspecified: Secondary | ICD-10-CM | POA: Insufficient documentation

## 2024-09-07 DIAGNOSIS — I10 Essential (primary) hypertension: Secondary | ICD-10-CM

## 2024-09-07 DIAGNOSIS — R2689 Other abnormalities of gait and mobility: Secondary | ICD-10-CM | POA: Diagnosis not present

## 2024-09-07 DIAGNOSIS — M6281 Muscle weakness (generalized): Secondary | ICD-10-CM | POA: Diagnosis not present

## 2024-09-11 ENCOUNTER — Ambulatory Visit (HOSPITAL_COMMUNITY)

## 2024-09-11 DIAGNOSIS — R262 Difficulty in walking, not elsewhere classified: Secondary | ICD-10-CM

## 2024-09-11 DIAGNOSIS — G319 Degenerative disease of nervous system, unspecified: Secondary | ICD-10-CM | POA: Diagnosis not present

## 2024-09-11 DIAGNOSIS — R270 Ataxia, unspecified: Secondary | ICD-10-CM | POA: Diagnosis not present

## 2024-09-11 DIAGNOSIS — R2689 Other abnormalities of gait and mobility: Secondary | ICD-10-CM | POA: Diagnosis not present

## 2024-09-11 DIAGNOSIS — R269 Unspecified abnormalities of gait and mobility: Secondary | ICD-10-CM | POA: Diagnosis not present

## 2024-09-11 DIAGNOSIS — M6281 Muscle weakness (generalized): Secondary | ICD-10-CM

## 2024-09-11 NOTE — Therapy (Signed)
 OUTPATIENT PHYSICAL THERAPY LOWER EXTREMITY TREATMENT   Patient Name: Craig Wagner MRN: [id number] DOB:02/27/1956, 68 y.o., male Today's Date: 09/11/2024  END OF SESSION:  PT End of Session - 09/11/24 1409     Visit Number 2    Number of Visits 12    Date for Recertification  10/19/24    Authorization Type [employer] medicare    Authorization Time Period no auth needed    PT Start Time 1409    PT Stop Time 1449    PT Time Calculation (min) 40 min    Activity Tolerance Patient tolerated treatment well    Behavior During Therapy [facility] for tasks assessed/performed          Past Medical History:  Diagnosis Date   Degenerative joint disease    Diabetes mellitus without complication (HCC)    Diverticulitis    Gout    Hypertension    Involuntary commitment 09/25/2022   Irritable bowel syndrome    Sleep apnea    Past Surgical History:  Procedure Laterality Date   BIOPSY  10/06/2020   Procedure: BIOPSY;  Surgeon: Cindie Carlin POUR, DO;  Location: [facility];  Service: Endoscopy;;   COLONOSCOPY WITH PROPOFOL  N/A 10/06/2020   Colonoscopy with non-bleeding internal hemorrhoids. Many diverticula in entire colon. Localized mild inflammation in sigmoid colon. Colon mucosa with hyperemia, negative inflammation.    compound fx tibia and fibula     WRIST SURGERY Left    Patient Active Problem List   Diagnosis Date Noted   Right foot pain 06/29/2024   Chest pain 06/27/2024   Peripheral neuropathy 06/27/2024   Chronic gastritis without bleeding 06/27/2024   Tinnitus of both ears 06/27/2024   Primary insomnia 06/12/2024   Gait disturbance 06/12/2024   Mixed hyperlipidemia 06/12/2024   Pain and swelling of left knee 03/29/2023   MDD (major depressive disorder), recurrent severe, without psychosis (HCC) 09/28/2022   MDD (major depressive disorder), recurrent episode, severe (HCC) 09/25/2022   Involuntary commitment 09/25/2022   Chronic diarrhea 11/19/2020   Type 2 diabetes  mellitus with hyperglycemia, without long-term current use of insulin  (HCC) 06/08/2018   Depression, recurrent 06/08/2018   Type 2 diabetes mellitus with diabetic chronic kidney disease (HCC) 06/08/2018   Essential hypertension 06/08/2018   Ankylosing spondylitis (HCC) 10/07/2015   Osteoarthritis of left knee 10/07/2015   Osteoarthritis of ankle 10/07/2015   Gout 10/07/2015    PCP: Terry Wilhelmena Lloyd Hilario, FNP  REFERRING PROVIDER: Vear Charlie LABOR, MD  REFERRING DIAG: R26.9 (ICD-10-CM) - Gait disturbance R27.0 (ICD-10-CM) - Ataxia of right upper extremity G31.9 (ICD-10-CM) - Cerebellar atrophy  THERAPY DIAG:  Difficulty in walking, not elsewhere classified  Imbalance  Other abnormalities of gait and mobility  Muscle weakness (generalized)  Ataxia  Rationale for Evaluation and Treatment: Rehabilitation  ONSET DATE: couple months  SUBJECTIVE:   SUBJECTIVE STATEMENT: Feeling ok today; having his usual back and knee pain; no new falls  Eval:  down about a year ago and hurt his right knee; lost some use of right hand from the fall as well.  Last couple months can't maintain standing balance without the walker.  Has a cane as well.  Dr. Tobie referred to Dr. Vear; MRI showed cerebellar atrophy  PERTINENT HISTORY: DM, gout, HTN; depression PAIN:  Are you having pain? Yes: NPRS scale: 3/10 Pain location: all over Pain description: sore Aggravating factors: none Relieving factors: heat and Aleve  PRECAUTIONS: Fall  RED FLAGS: None   WEIGHT BEARING RESTRICTIONS: No  FALLS:  Has patient fallen in last 6 months? Yes. Number of falls 2  LIVING ENVIRONMENT: Lives with: lives alone Lives in: House/apartment Stairs: Yes: Internal: 20 steps; on right going up, on left going up, and can reach both and External: 7 steps; on right going up, on left going up, and can reach both Has following equipment at home: Single point cane, Walker - 2 wheeled, shower chair, and Grab  bars  OCCUPATION: retired  PLOF: Independent with household mobility with device  PATIENT GOALS: be able to do what normal people do  NEXT MD VISIT: 1 year  OBJECTIVE:  Note: Objective measures were completed at Evaluation unless otherwise noted.  DIAGNOSTIC FINDINGS:  IMPRESSION:    MRI brain without contrast demonstrating: - Fourth ventricle is moderate to severely enlarged, with asymmetric enlargement towards the right side.  Posterior fossa midline arachnoid cyst noted slightly to the left of midline measuring 3.4 x 2.2 cm on axial views.  Moderate-severe cerebellar vermian atrophy noted on sagittal views. Hazy T2 FLAIR hyperintensity within the right cerebellar hemisphere.  These findings have significantly progressed since 09/24/2022.  Considerations include underlying cerebellar neurodegeneration syndrome, post-ischemic or post-inflammatory changes. - Mild chronic small vessel ischemic disease. - No acute findings.     INTERPRETING PHYSICIAN:  EDUARD FABIENE HANLON, MD Certified in Neurology, Neurophysiology and Neuroimaging   [facility] [address] [place], [place] [address] [phone]  Limited views of the soft tissues of the head and neck are unremarkable except for posterior fossa arachnoid cyst, cerebellar atrophy and abnormal signal within the right cerebral hemisphere; these findings are better visualized on MRI brain from the same day.     IMPRESSION:    MRI cervical spine without contrast demonstrating: - At C5-6 disc bulging and facet hypertrophy with moderate spinal stenosis and severe bilateral foraminal stenosis; no cord signal abnormalities. - At C3-4 uncovertebral joint hypertrophy with moderate bilateral foraminal stenosis. - At C4-5 disc bulging facet hypertrophy with severe bilateral foraminal stenosis.  MR LUMBAR SPINE WO CONTRAST (Accession [id number]) (Order [id number]) Imaging Date: 07/12/2024 Department: [facility]  MRI Imaging Released By: Jamesetta Joen CROME Authorizing: Vear Charlie LABOR, MD   Exam Status  Status  Final [99]   PACS Intelerad Image Link   Show images for MR LUMBAR SPINE WO CONTRAST Study Result  Narrative & Impression  [facility]   NEUROIMAGING REPORT     STUDY DATE: 07/12/24 PATIENT NAME: Marwin Primmer DOB: [birth year] MRN: [id number]   ORDERING CLINICIAN: Vear Charlie LABOR, MD  CLINICAL HISTORY: 69 y.o. year old male with: 1. Gait disturbance   2. Right leg weakness   3. Lumbar radiculopathy       EXAM: MR LUMBAR SPINE WO CONTRAST  TECHNIQUE: MRI of the lumbar spine was obtained utilizing multiplanar, multiecho pulse sequences. CONTRAST:  Diagnostic Product Medications (last 72 hours)       None         COMPARISON: 10/31/15 MRI   IMAGING SITE: [facility]  MRI Imaging [address] [place] [address]       FINDINGS:    On sagittal views the vertebral bodies have normal height and alignment.  The conus medullaris terminates at the level of L1.     On axial views:  T12-L1: No spinal stenosis or foraminal narrowing  L1-2: Disc bulging with mild right foraminal stenosis L2-3: Disc bulging and facet hypertrophy with mild bilateral foraminal stenosis L3-4: Disc  bulging and facet hypertrophy with mild spinal stenosis and moderate bilateral foraminal stenosis L4-5: Disc bulging and facet hypertrophy with mild spinal stenosis and moderate right and severe left foraminal stenosis L5-S1: Disc bulging and facet hypertrophy with mild right and severe left foraminal stenosis   Limited views of the aorta, kidneys, iliopsoas muscles and sacroiliac joints are unremarkable.     IMPRESSION:    MRI lumbar spine (without) demonstrating: - At L4-5: Disc bulging and facet hypertrophy with mild spinal stenosis and moderate right and severe left foraminal stenosis. - At L3-4: Disc bulging and facet hypertrophy  with mild spinal stenosis and moderate bilateral foraminal stenosis. - Minimal progression of degenerative changes compared to 10/31/2015.             PATIENT SURVEYS:  ABC scale: The Activities-Specific Balance Confidence (ABC) Scale 0% 10 20 30  40 50 60 70 80 90 100% No confidence<->completely confident  "How confident are you that you will not lose your balance or become unsteady when you . . .   Date tested 09/07/2024                                                  Total: #/16 84.4%     COGNITION: Overall cognitive status: Within functional limits for tasks assessed     SENSATION: WFL  EDEMA:   Coordination heel to shin impaired right on left; impaired right hand finger to nose    POSTURE: rounded shoulders, forward head, and flexed trunk   PALPATION:   LOWER EXTREMITY ROM:  Active ROM Right eval Left eval  Hip flexion    Hip extension    Hip abduction    Hip adduction    Hip internal rotation    Hip external rotation    Knee flexion    Knee extension Extension lag   Ankle dorsiflexion    Ankle plantarflexion    Ankle inversion    Ankle eversion     (Blank rows = not tested)  LOWER EXTREMITY MMT:  MMT Right eval Left eval  Hip flexion 4+ 5  Hip extension    Hip abduction    Hip adduction    Hip internal rotation    Hip external rotation    Knee flexion    Knee extension 4 (extension lag) 5  Ankle dorsiflexion 4+ 5  Ankle plantarflexion    Ankle inversion    Ankle eversion     (Blank rows = not tested)   FUNCTIONAL TESTS:   SLS unable either foot 5 times sit to stand: 32.62 sec using hands to assist up Timed up and go (TUG): 30.68 with RW Dynamic Gait Index: Dynamic Gait Index; performed with cane and CGA to min A  Mark the lowest level that applies.   Date Performed 09/07/2024  Gait level surface (1) Moderate Impairment: Walks 20', slow speed, abnormal gait pattern, evidence for imbalance.  2. Change in gait speed  (1) Moderate Impairment: Makes only minor adjustments to walking speed, or accomplishes a change in speed with significant gait deviations, or changes speed but has significant gait deviations, or changes speed but loses balance but is able to recover and continue walking  3. Gait with horizontal head turns (1) Moderate Impairment: Performs head turns with moderate change in gait velocity, slows down, staggers but recovers, can continue to walk  4. Gait with vertical head turns (1) Moderate Impairment: Performs head turns with moderate change in gait velocity, slows down, staggers but recovers, can continue to walk  5. Gait and pivot turn (2) Mild Impairment: Pivot turns safely in > 3 seconds and stops with no loss of balance  6. Step over obstacle (0) Severe Impairment: Cannot perform without assistance  7. Step around obstacle (1) Moderate Impairment: Is able to clear cones but must significantly slow, speed to accomplish task, or requires verbal cueing  8. Steps (1) Moderate Impairment: Two feet to a stair, must use rail  Total score 8/24    Score Interpretation: Score of <19 indicates high risk of falls.  Minimally Clinically Important Difference (MCID):  =DGI scores of<21/24 = 1.80 points DGI scores of >21/24 = 0.60 points   [place] T, Inbar-Borovsky N, Brozgol M, Giladi N, [place] JM. The Dynamic Gait Index in healthy older adults: the role of stair climbing, fear of falling and gender. Gait Posture. 2009 Feb;29(2):237-41. doi: 10.1016/j.gaitpost.2008.08.013. Epub 2008 Oct 8. PMID: 81154560; PMCID: EFR7290501.  Pardasaney, MYRTIS LOIS Bonus, GEANNIE POUR., et al. (2012). Sensitivity to change and responsiveness of four balance measures for community-dwelling older adults. Physical therapy 92(3): 388-397.   GAIT: Distance walked: 60 ft in clinic Assistive device utilized: Walker - 2 wheeled Level of assistance: SBA Comments: slow gait speed; decreased control right side; slight foot drop  right                                                                                                                                TREATMENT DATE:  09/11/24 Review of HEP and goals Standing: Marching with bilateral UE assist x 10 Heel/toe raises 2 x 10; 1st set with bilateral UE assist; then 1 UE assist 1 UE assist hip ABDuction 2 x 10 6 step toe taps with 1 UE assist 2 x 10 2 cones toe tap alternate then tap 2 with same foot and 1 UE assist 2 x 5 each One leg up and over yoga block; hip adduction and abduction 2 x 5 each with 1 UE assist Tandem stance 2 x 30  each intermittent UE assist Squat to chair for target 2 x 10 Sit to stand no UE assist x 5 Nustep seat 11 level 3 x 5' to end treatment   09/07/2024 physical therapy evaluation and HEP instruction    PATIENT EDUCATION:  Education details: Patient educated on exam findings, POC, scope of PT, HEP, and what to expect next visit. Person educated: Patient Education method: Explanation, Demonstration, and Handouts Education comprehension: verbalized understanding, returned demonstration, verbal cues required, and tactile cues required  HOME EXERCISE PROGRAM: Access Code: [email or web address] URL: [email or web address] Date: 09/07/2024 Prepared by: [facility] - Rehab  Exercises - Standing March with Counter Support  - 2 x daily - 7 x weekly - 2 sets - 10 reps - Standing Ankle Plantar Flexion Dorsiflexion with Counter Support  - 2  x daily - 7 x weekly - 2 sets - 10 reps  ASSESSMENT:  CLINICAL IMPRESSION: Today's session started with a review of Hep and goals with patient verbalizing agreement with set rehab goals.  Progress balance and lower extremity exercises today; patient with more difficulty on his right side with coordinating toe taps and general movements on the right side; tends to involuntarily flex his right upper extremity (increased tone) with activity that requires more focus to task.  Patient will benefit from  continued skilled therapy services to address deficits and promote return to optimal function.      Eval:Patient is a 69 y.o. male who was seen today for physical therapy evaluation and treatment for R26.9 (ICD-10-CM) - Gait disturbance R27.0 (ICD-10-CM) - Ataxia of right upper extremity G31.9 (ICD-10-CM) - Cerebellar atrophy. Patient demonstrates decreased strength, balance deficits and gait abnormalities which are negatively impacting patient ability to perform ADLs and functional mobility tasks. Patient will benefit from skilled physical therapy services to address these deficits to improve level of function with ADLs, functional mobility tasks, and reduce risk for falls.    OBJECTIVE IMPAIRMENTS: Abnormal gait, decreased balance, difficulty walking, decreased strength, increased fascial restrictions, impaired perceived functional ability, and pain.   ACTIVITY LIMITATIONS: carrying, lifting, bending, standing, stairs, transfers, and locomotion level  PARTICIPATION LIMITATIONS: meal prep, cleaning, laundry, driving, shopping, and community activity  REHAB POTENTIAL: Good  CLINICAL DECISION MAKING: Evolving/moderate complexity  EVALUATION COMPLEXITY: Moderate   GOALS: Goals reviewed with patient? No  SHORT TERM GOALS: Target date: 09/28/2024 patient will be independent with initial HEP and compliant with HEP 3-4 times a week   Baseline: Goal status: in progress  2.  Patient will report 30% improvement overall  Baseline:  Goal status: in progress  3.  Patient will able to stand SLS on each leg x 5 to demonstrate improved functional balance  Baseline:  Goal status: in progress  LONG TERM GOALS: Target date: 10/19/2024  Patient will be independent in self management strategies to improve quality of life and functional outcomes. Baseline:  Goal status: in progress  2.  Patient will report 50-60% improvement overall Baseline:  Goal status: in progress  3.  Patient will  able to stand SLS on each leg x 10 to demonstrate improved functional balance  Baseline:  Goal status: in progress  4.  Patient will improve his DGI score to 17/24 to demonstrate improved functional mobility and balance and decreased fall risk  Baseline: 8/24 Goal status: in progress  5.  Patient will ambulate with cane 224 ft  level surfaces safely and independently Baseline: unable Goal status: in progress   PLAN:  PT FREQUENCY: 2x/week  PT DURATION: 6 weeks  PLANNED INTERVENTIONS: 97164- PT Re-evaluation, 97110-Therapeutic exercises, 97530- Therapeutic activity, 97112- Neuromuscular re-education, 97535- Self Care, [id number]- Manual therapy, [id number]- Gait training, [phone]- Orthotic Fit/training, [phone]- Canalith repositioning, [id number]- Aquatic Therapy, 97760- Splinting, [phone]- Wound care (first 20 sq cm), 97598- Wound care (each additional 20 sq cm)Patient/Family education, Balance training, Stair training, Taping, Dry Needling, Joint mobilization, Joint manipulation, Spinal manipulation, Spinal mobilization, Scar mobilization, and DME instructions.   PLAN FOR NEXT SESSION: gait and balance training; lower extremity strengthening.   2:47 PM, 09/11/24 Joby Hershkowitz Small Danyiel Crespin MPT [facility] [place] [phone]

## 2024-09-13 ENCOUNTER — Ambulatory Visit (HOSPITAL_COMMUNITY)

## 2024-09-13 ENCOUNTER — Telehealth (HOSPITAL_COMMUNITY): Payer: Self-pay

## 2024-09-13 NOTE — Telephone Encounter (Signed)
 Initially thought was a no show, called and spoke to pt concerning missed apt today. Pt reports he hurt his foot and it is swollen, unable to walk on. Stated he called and left message to cancel today's apt this morning. Reminded next apt date and time.    Augustin Mclean, LPTA/CLT; WILLAIM [phone]

## 2024-09-17 ENCOUNTER — Ambulatory Visit (HOSPITAL_COMMUNITY): Admitting: Physical Therapy

## 2024-09-17 DIAGNOSIS — R269 Unspecified abnormalities of gait and mobility: Secondary | ICD-10-CM | POA: Diagnosis not present

## 2024-09-17 DIAGNOSIS — G319 Degenerative disease of nervous system, unspecified: Secondary | ICD-10-CM | POA: Diagnosis not present

## 2024-09-17 DIAGNOSIS — M6281 Muscle weakness (generalized): Secondary | ICD-10-CM | POA: Diagnosis not present

## 2024-09-17 DIAGNOSIS — R262 Difficulty in walking, not elsewhere classified: Secondary | ICD-10-CM | POA: Diagnosis not present

## 2024-09-17 DIAGNOSIS — R270 Ataxia, unspecified: Secondary | ICD-10-CM

## 2024-09-17 DIAGNOSIS — R2689 Other abnormalities of gait and mobility: Secondary | ICD-10-CM | POA: Diagnosis not present

## 2024-09-17 NOTE — Therapy (Addendum)
 OUTPATIENT PHYSICAL THERAPY LOWER EXTREMITY TREATMENT   Patient Name: Craig Wagner MRN: [id number] DOB:December 20, 1955, 68 y.o., male Today's Date: 09/17/2024  END OF SESSION:  PT End of Session - 09/17/24 1514     Visit Number 3    Number of Visits 12    Date for Recertification  10/19/24    Authorization Type [employer] medicare    Authorization Time Period no auth needed    PT Start Time 1505    PT Stop Time 1535    PT Time Calculation (min) 30 min    Activity Tolerance Patient tolerated treatment well    Behavior During Therapy [facility] for tasks assessed/performed           Past Medical History:  Diagnosis Date   Degenerative joint disease    Diabetes mellitus without complication (HCC)    Diverticulitis    Gout    Hypertension    Involuntary commitment 09/25/2022   Irritable bowel syndrome    Sleep apnea    Past Surgical History:  Procedure Laterality Date   BIOPSY  10/06/2020   Procedure: BIOPSY;  Surgeon: Cindie Carlin POUR, DO;  Location: [facility];  Service: Endoscopy;;   COLONOSCOPY WITH PROPOFOL  N/A 10/06/2020   Colonoscopy with non-bleeding internal hemorrhoids. Many diverticula in entire colon. Localized mild inflammation in sigmoid colon. Colon mucosa with hyperemia, negative inflammation.    compound fx tibia and fibula     WRIST SURGERY Left    Patient Active Problem List   Diagnosis Date Noted   Right foot pain 06/29/2024   Chest pain 06/27/2024   Peripheral neuropathy 06/27/2024   Chronic gastritis without bleeding 06/27/2024   Tinnitus of both ears 06/27/2024   Primary insomnia 06/12/2024   Gait disturbance 06/12/2024   Mixed hyperlipidemia 06/12/2024   Pain and swelling of left knee 03/29/2023   MDD (major depressive disorder), recurrent severe, without psychosis (HCC) 09/28/2022   MDD (major depressive disorder), recurrent episode, severe (HCC) 09/25/2022   Involuntary commitment 09/25/2022   Chronic diarrhea 11/19/2020   Type 2 diabetes  mellitus with hyperglycemia, without long-term current use of insulin  (HCC) 06/08/2018   Depression, recurrent 06/08/2018   Type 2 diabetes mellitus with diabetic chronic kidney disease (HCC) 06/08/2018   Essential hypertension 06/08/2018   Ankylosing spondylitis (HCC) 10/07/2015   Osteoarthritis of left knee 10/07/2015   Osteoarthritis of ankle 10/07/2015   Gout 10/07/2015    PCP: Terry Wilhelmena Lloyd Hilario, FNP  REFERRING PROVIDER: Vear Charlie LABOR, MD  REFERRING DIAG: R26.9 (ICD-10-CM) - Gait disturbance R27.0 (ICD-10-CM) - Ataxia of right upper extremity G31.9 (ICD-10-CM) - Cerebellar atrophy  THERAPY DIAG:  Difficulty in walking, not elsewhere classified  Imbalance  Other abnormalities of gait and mobility  Muscle weakness (generalized)  Ataxia   Rationale for Evaluation and Treatment: Rehabilitation  ONSET DATE: couple months  SUBJECTIVE:   SUBJECTIVE STATEMENT: Pt comes today stating he had to cancel his last appt due to kicking his foot on a piece of furniture and wasn't able to put any weight on it. States it has almost returned to normal at this point.  States he did not go to the doctor.  Comes today complaining about coming  twice a week and states he has a lot going on right now and requests to wait and resume at beginning of the year.    Eval:  down about a year ago and hurt his right knee; lost some use of right hand from the fall as well.  Last  couple months can't maintain standing balance without the walker.  Has a cane as well.  Dr. Tobie referred to Dr. Vear; MRI showed cerebellar atrophy  PERTINENT HISTORY: DM, gout, HTN; depression PAIN:  Are you having pain? No  PRECAUTIONS: Fall  RED FLAGS: None   WEIGHT BEARING RESTRICTIONS: No  FALLS:  Has patient fallen in last 6 months? Yes. Number of falls 2  LIVING ENVIRONMENT: Lives with: lives alone Lives in: House/apartment Stairs: Yes: Internal: 20 steps; on right going up, on left going up, and  can reach both and External: 7 steps; on right going up, on left going up, and can reach both Has following equipment at home: Single point cane, Walker - 2 wheeled, shower chair, and Grab bars  OCCUPATION: retired  PLOF: Independent with household mobility with device  PATIENT GOALS: be able to do what normal people do  NEXT MD VISIT: 1 year  OBJECTIVE:  Note: Objective measures were completed at Evaluation unless otherwise noted.  DIAGNOSTIC FINDINGS:  IMPRESSION:    MRI brain without contrast demonstrating: - Fourth ventricle is moderate to severely enlarged, with asymmetric enlargement towards the right side.  Posterior fossa midline arachnoid cyst noted slightly to the left of midline measuring 3.4 x 2.2 cm on axial views.  Moderate-severe cerebellar vermian atrophy noted on sagittal views. Hazy T2 FLAIR hyperintensity within the right cerebellar hemisphere.  These findings have significantly progressed since 09/24/2022.  Considerations include underlying cerebellar neurodegeneration syndrome, post-ischemic or post-inflammatory changes. - Mild chronic small vessel ischemic disease. - No acute findings.     INTERPRETING PHYSICIAN:  EDUARD FABIENE HANLON, MD Certified in Neurology, Neurophysiology and Neuroimaging   [facility] [address], [address] [place], [place] [address] [phone]  Limited views of the soft tissues of the head and neck are unremarkable except for posterior fossa arachnoid cyst, cerebellar atrophy and abnormal signal within the right cerebral hemisphere; these findings are better visualized on MRI brain from the same day.     IMPRESSION:    MRI cervical spine without contrast demonstrating: - At C5-6 disc bulging and facet hypertrophy with moderate spinal stenosis and severe bilateral foraminal stenosis; no cord signal abnormalities. - At C3-4 uncovertebral joint hypertrophy with moderate bilateral foraminal stenosis. - At C4-5 disc  bulging facet hypertrophy with severe bilateral foraminal stenosis.  MR LUMBAR SPINE WO CONTRAST (Accession [id number]) (Order [id number]) Imaging Date: 07/12/2024 Department: [facility]  MRI Imaging Released By: Jamesetta Joen CROME Authorizing: Vear Charlie LABOR, MD   Exam Status  Status  Final [99]   PACS Intelerad Image Link   Show images for MR LUMBAR SPINE WO CONTRAST Study Result  Narrative & Impression  [facility]   NEUROIMAGING REPORT     STUDY DATE: 07/12/24 PATIENT NAME: Azlan Hanway DOB: 1956/05/11 MRN: [id number]   ORDERING CLINICIAN: Vear Charlie LABOR, MD  CLINICAL HISTORY: 68 y.o. year old male with: 1. Gait disturbance   2. Right leg weakness   3. Lumbar radiculopathy       EXAM: MR LUMBAR SPINE WO CONTRAST  TECHNIQUE: MRI of the lumbar spine was obtained utilizing multiplanar, multiecho pulse sequences. CONTRAST:  Diagnostic Product Medications (last 72 hours)       None         COMPARISON: 10/31/15 MRI   IMAGING SITE: [facility]  MRI Imaging [address] [place] [address]       FINDINGS:    On sagittal views the  vertebral bodies have normal height and alignment.  The conus medullaris terminates at the level of L1.     On axial views:  T12-L1: No spinal stenosis or foraminal narrowing  L1-2: Disc bulging with mild right foraminal stenosis L2-3: Disc bulging and facet hypertrophy with mild bilateral foraminal stenosis L3-4: Disc bulging and facet hypertrophy with mild spinal stenosis and moderate bilateral foraminal stenosis L4-5: Disc bulging and facet hypertrophy with mild spinal stenosis and moderate right and severe left foraminal stenosis L5-S1: Disc bulging and facet hypertrophy with mild right and severe left foraminal stenosis   Limited views of the aorta, kidneys, iliopsoas muscles and sacroiliac joints are unremarkable.     IMPRESSION:    MRI lumbar spine (without)  demonstrating: - At L4-5: Disc bulging and facet hypertrophy with mild spinal stenosis and moderate right and severe left foraminal stenosis. - At L3-4: Disc bulging and facet hypertrophy with mild spinal stenosis and moderate bilateral foraminal stenosis. - Minimal progression of degenerative changes compared to 10/31/2015.             PATIENT SURVEYS:  ABC scale: The Activities-Specific Balance Confidence (ABC) Scale 0% 10 20 30  40 50 60 70 80 90 100% No confidence<->completely confident  "How confident are you that you will not lose your balance or become unsteady when you . . .   Date tested 09/07/2024  Total: #/16 84.4%     COGNITION: Overall cognitive status: Within functional limits for tasks assessed     SENSATION: WFL  EDEMA:   Coordination heel to shin impaired right on left; impaired right hand finger to nose    POSTURE: rounded shoulders, forward head, and flexed trunk   PALPATION:   LOWER EXTREMITY ROM:  Active ROM Right eval Left eval  Hip flexion    Hip extension    Hip abduction    Hip adduction    Hip internal rotation    Hip external rotation    Knee flexion    Knee extension Extension lag   Ankle dorsiflexion    Ankle plantarflexion    Ankle inversion    Ankle eversion     (Blank rows = not tested)  LOWER EXTREMITY MMT:  MMT Right eval Left eval  Hip flexion 4+ 5  Hip extension    Hip abduction    Hip adduction    Hip internal rotation    Hip external rotation    Knee flexion    Knee extension 4 (extension lag) 5  Ankle dorsiflexion 4+ 5  Ankle plantarflexion    Ankle inversion    Ankle eversion     (Blank rows = not tested)   FUNCTIONAL TESTS:   SLS unable either foot 5 times sit to stand: 32.62 sec using hands to assist up Timed up and go (TUG): 30.68 with RW Dynamic Gait Index: Dynamic Gait Index; performed with cane and CGA to min A  Mark the lowest level that applies.   Date Performed 09/07/2024  Gait level  surface (1) Moderate Impairment: Walks 20', slow speed, abnormal gait pattern, evidence for imbalance.  2. Change in gait speed (1) Moderate Impairment: Makes only minor adjustments to walking speed, or accomplishes a change in speed with significant gait deviations, or changes speed but has significant gait deviations, or changes speed but loses balance but is able to recover and continue walking  3. Gait with horizontal head turns (1) Moderate Impairment: Performs head turns with moderate change in gait velocity, slows down, staggers but  recovers, can continue to walk  4. Gait with vertical head turns (1) Moderate Impairment: Performs head turns with moderate change in gait velocity, slows down, staggers but recovers, can continue to walk  5. Gait and pivot turn (2) Mild Impairment: Pivot turns safely in > 3 seconds and stops with no loss of balance  6. Step over obstacle (0) Severe Impairment: Cannot perform without assistance  7. Step around obstacle (1) Moderate Impairment: Is able to clear cones but must significantly slow, speed to accomplish task, or requires verbal cueing  8. Steps (1) Moderate Impairment: Two feet to a stair, must use rail  Total score 8/24    Score Interpretation: Score of <19 indicates high risk of falls.  Minimally Clinically Important Difference (MCID):  =DGI scores of<21/24 = 1.80 points DGI scores of >21/24 = 0.60 points   [place] T, Inbar-Borovsky N, Brozgol M, Giladi N, [place] JM. The Dynamic Gait Index in healthy older adults: the role of stair climbing, fear of falling and gender. Gait Posture. 2009 Feb;29(2):237-41. doi: 10.1016/j.gaitpost.2008.08.013. Epub 2008 Oct 8. PMID: 81154560; PMCID: EFR7290501.  Pardasaney, MYRTIS LOIS Bonus, GEANNIE POUR., et al. (2012). Sensitivity to change and responsiveness of four balance measures for community-dwelling older adults. Physical therapy 92(3): 388-397.   GAIT: Distance walked: 60 ft in clinic Assistive device  utilized: Walker - 2 wheeled Level of assistance: SBA Comments: slow gait speed; decreased control right side; slight foot drop right                                                                                                                                TREATMENT DATE:  09/17/24 Sit to stands 10x no UE raised seat Standing;  hip abduction 10X each  Hip extension 10X each  Tandem stance  Vectors, 2 way kicks 10X3 Pt unable to do any further exercises due to Rt knee pain.   09/11/24 Review of HEP and goals Standing: Marching with bilateral UE assist x 10 Heel/toe raises 2 x 10; 1st set with bilateral UE assist; then 1 UE assist 1 UE assist hip ABDuction 2 x 10 6 step toe taps with 1 UE assist 2 x 10 2 cones toe tap alternate then tap 2 with same foot and 1 UE assist 2 x 5 each One leg up and over yoga block; hip adduction and abduction 2 x 5 each with 1 UE assist Tandem stance 2 x 30  each intermittent UE assist Squat to chair for target 2 x 10 Sit to stand no UE assist x 5 Nustep seat 11 level 3 x 5' to end treatment   09/07/2024 physical therapy evaluation and HEP instruction    PATIENT EDUCATION:  Education details: Patient educated on exam findings, POC, scope of PT, HEP, and what to expect next visit. Person educated: Patient Education method: Explanation, Demonstration, and Handouts Education comprehension: verbalized understanding, returned demonstration, verbal cues required, and tactile cues required  HOME  EXERCISE PROGRAM: Access Code: [email or web address] URL: [email or web address] Date: 09/07/2024 Prepared by: [facility] - Rehab Exercises - Standing March with Counter Support  - 2 x daily - 7 x weekly - 2 sets - 10 reps - Standing Ankle Plantar Flexion Dorsiflexion with Counter Support  - 2 x daily - 7 x weekly - 2 sets - 10 reps  Access Code: [email or web address] URL: [email or web address] Date: 09/17/2024 Prepared by: Greig Fuse - tandem stance  balance; try not to hold on  - 2 x daily - 7 x weekly - 1 sets - 2 reps - 30 sec hold - Sit to Stand Without Arm Support  - 2 x daily - 7 x weekly - 2 sets - 5 reps - Standing Hip Abduction with Counter Support  - 2 x daily - 7 x weekly - 10 reps - Standing 3-Way Kick  - 2 x daily - 7 x weekly - 10 reps - 3 second  hold  ASSESSMENT:  CLINICAL IMPRESSION: Today's session focused on LE strengthening and stabilization.  PT initially stated he would like to discontinue and resume after first of the year, then stated he would come to his next appt in December.  Started with a few established therex and added vectors to help work on stability.  Pt able to do only a few exercises before stating he was done for the day that his Rt knee was hurting. Session limited today due to inability to complete full session.   Noted stiffness in Rt knee with ambulation. Able to correct this with cues when ambulating out of clinic today.  Pt given updated HEP and reminded of next appt scheduled in December.  Patient will benefit from continued skilled therapy services to address deficits and promote return to optimal function.      Eval:Patient is a 68 y.o. male who was seen today for physical therapy evaluation and treatment for R26.9 (ICD-10-CM) - Gait disturbance R27.0 (ICD-10-CM) - Ataxia of right upper extremity G31.9 (ICD-10-CM) - Cerebellar atrophy. Patient demonstrates decreased strength, balance deficits and gait abnormalities which are negatively impacting patient ability to perform ADLs and functional mobility tasks. Patient will benefit from skilled physical therapy services to address these deficits to improve level of function with ADLs, functional mobility tasks, and reduce risk for falls.    OBJECTIVE IMPAIRMENTS: Abnormal gait, decreased balance, difficulty walking, decreased strength, increased fascial restrictions, impaired perceived functional ability, and pain.   ACTIVITY LIMITATIONS: carrying,  lifting, bending, standing, stairs, transfers, and locomotion level  PARTICIPATION LIMITATIONS: meal prep, cleaning, laundry, driving, shopping, and community activity  REHAB POTENTIAL: Good  CLINICAL DECISION MAKING: Evolving/moderate complexity  EVALUATION COMPLEXITY: Moderate   GOALS: Goals reviewed with patient? No  SHORT TERM GOALS: Target date: 09/28/2024 patient will be independent with initial HEP and compliant with HEP 3-4 times a week   Baseline: Goal status: in progress  2.  Patient will report 30% improvement overall  Baseline:  Goal status: in progress  3.  Patient will able to stand SLS on each leg x 5 to demonstrate improved functional balance  Baseline:  Goal status: in progress  LONG TERM GOALS: Target date: 10/19/2024  Patient will be independent in self management strategies to improve quality of life and functional outcomes. Baseline:  Goal status: in progress  2.  Patient will report 50-60% improvement overall Baseline:  Goal status: in progress  3.  Patient will able to stand SLS on each leg x 10 to demonstrate improved functional balance  Baseline:  Goal status: in progress  4.  Patient will improve his DGI score to 17/24 to demonstrate improved functional mobility and balance and decreased fall risk  Baseline: 8/24 Goal status: in progress  5.  Patient will ambulate with cane 224 ft  level surfaces safely and independently Baseline: unable Goal status: in progress   PLAN:  PT FREQUENCY: 2x/week  PT DURATION: 6 weeks  PLANNED INTERVENTIONS: 97164- PT Re-evaluation, 97110-Therapeutic exercises, 97530- Therapeutic activity, 97112- Neuromuscular re-education, 97535- Self Care, 02859- Manual therapy, [id number]- Gait training, [phone]- Orthotic Fit/training, [phone]- Canalith repositioning, [id number]- Aquatic Therapy, 97760- Splinting, [phone]- Wound care (first 20 sq cm), 97598- Wound care (each additional 20 sq cm)Patient/Family education, Balance  training, Stair training, Taping, Dry Needling, Joint mobilization, Joint manipulation, Spinal manipulation, Spinal mobilization, Scar mobilization, and DME instructions.   PLAN FOR NEXT SESSION: gait and balance training; lower extremity strengthening.   3:15 PM, 09/17/24 Greig KATHEE Fuse, PTA/CLT [facility] Ph: [phone]

## 2024-09-20 ENCOUNTER — Other Ambulatory Visit (INDEPENDENT_AMBULATORY_CARE_PROVIDER_SITE_OTHER): Payer: Self-pay

## 2024-09-20 ENCOUNTER — Other Ambulatory Visit: Payer: Self-pay

## 2024-09-20 DIAGNOSIS — L089 Local infection of the skin and subcutaneous tissue, unspecified: Secondary | ICD-10-CM

## 2024-09-28 ENCOUNTER — Ambulatory Visit (INDEPENDENT_AMBULATORY_CARE_PROVIDER_SITE_OTHER): Admitting: Audiology

## 2024-09-28 ENCOUNTER — Ambulatory Visit (INDEPENDENT_AMBULATORY_CARE_PROVIDER_SITE_OTHER)

## 2024-10-09 ENCOUNTER — Ambulatory Visit (HOSPITAL_COMMUNITY)

## 2024-10-12 ENCOUNTER — Ambulatory Visit (HOSPITAL_COMMUNITY)

## 2024-10-16 ENCOUNTER — Encounter (HOSPITAL_COMMUNITY): Payer: Self-pay

## 2024-10-16 NOTE — Therapy (Signed)
 PHYSICAL THERAPY DISCHARGE SUMMARY  Visits from Start of Care: 3  Current functional level related to goals / functional outcomes: unknown   Remaining deficits: unknown   Education / Equipment: HEP   Patient agrees to discharge. Patient goals were not met. Patient is being discharged due to the patient's request. Cannot make it in right now  7:28 AM, 10/16/2024 Craig Wagner Craig Aleric Wagner MPT [facility] [place] [phone]

## 2024-10-23 ENCOUNTER — Ambulatory Visit (HOSPITAL_COMMUNITY)

## 2024-10-26 ENCOUNTER — Ambulatory Visit (HOSPITAL_COMMUNITY)

## 2024-10-29 ENCOUNTER — Ambulatory Visit (HOSPITAL_COMMUNITY)

## 2024-11-01 ENCOUNTER — Ambulatory Visit (HOSPITAL_COMMUNITY)

## 2024-11-05 ENCOUNTER — Ambulatory Visit (HOSPITAL_COMMUNITY)

## 2024-11-08 ENCOUNTER — Ambulatory Visit (HOSPITAL_COMMUNITY)

## 2024-11-12 ENCOUNTER — Ambulatory Visit (HOSPITAL_COMMUNITY)

## 2024-11-15 ENCOUNTER — Ambulatory Visit (HOSPITAL_COMMUNITY)

## 2024-11-26 ENCOUNTER — Ambulatory Visit: Admitting: Neurology

## 2024-12-26 ENCOUNTER — Ambulatory Visit
